# Patient Record
Sex: Male | Born: 1955 | Race: White | Hispanic: No | Marital: Single | Smoking: Former smoker
Health system: Southern US, Community
[De-identification: ages and names within clinical notes are randomized; demographics above are authoritative.]

## PROBLEM LIST (undated history)

## (undated) DIAGNOSIS — Z952 Presence of prosthetic heart valve: Secondary | ICD-10-CM

## (undated) DIAGNOSIS — I10 Essential (primary) hypertension: Secondary | ICD-10-CM

## (undated) DIAGNOSIS — R0789 Other chest pain: Secondary | ICD-10-CM

## (undated) DIAGNOSIS — Z7901 Long term (current) use of anticoagulants: Secondary | ICD-10-CM

## (undated) DIAGNOSIS — F419 Anxiety disorder, unspecified: Secondary | ICD-10-CM

## (undated) DIAGNOSIS — F32A Depression, unspecified: Secondary | ICD-10-CM

## (undated) DIAGNOSIS — E785 Hyperlipidemia, unspecified: Secondary | ICD-10-CM

## (undated) DIAGNOSIS — J45909 Unspecified asthma, uncomplicated: Secondary | ICD-10-CM

## (undated) DIAGNOSIS — F329 Major depressive disorder, single episode, unspecified: Secondary | ICD-10-CM

## (undated) DIAGNOSIS — I712 Thoracic aortic aneurysm, without rupture: Secondary | ICD-10-CM

## (undated) HISTORY — PX: CARDIAC CATHETERIZATION: SHX172

## (undated) HISTORY — PX: HAND SURGERY: SHX662

## (undated) HISTORY — DX: Hyperlipidemia, unspecified: E78.5

## (undated) HISTORY — DX: Presence of prosthetic heart valve: Z95.2

## (undated) HISTORY — DX: Essential (primary) hypertension: I10

## (undated) HISTORY — DX: Other chest pain: R07.89

## (undated) HISTORY — DX: Long term (current) use of anticoagulants: Z79.01

---

## 1986-04-25 HISTORY — PX: AORTIC VALVE REPLACEMENT: SHX41

## 1996-06-12 HISTORY — PX: DOPPLER ECHOCARDIOGRAPHY: SHX263

## 2003-10-18 ENCOUNTER — Ambulatory Visit (HOSPITAL_COMMUNITY): Admission: RE | Admit: 2003-10-18 | Discharge: 2003-10-18 | Payer: Self-pay | Admitting: Family Medicine

## 2006-05-30 ENCOUNTER — Emergency Department (HOSPITAL_COMMUNITY): Admission: EM | Admit: 2006-05-30 | Discharge: 2006-05-30 | Payer: Self-pay | Admitting: Emergency Medicine

## 2006-06-08 ENCOUNTER — Encounter: Admission: RE | Admit: 2006-06-08 | Discharge: 2006-06-08 | Payer: Self-pay | Admitting: Family Medicine

## 2009-12-25 ENCOUNTER — Ambulatory Visit: Payer: Self-pay | Admitting: Cardiology

## 2010-01-08 ENCOUNTER — Ambulatory Visit: Payer: Self-pay | Admitting: Cardiology

## 2010-02-05 ENCOUNTER — Ambulatory Visit: Payer: Self-pay | Admitting: Cardiology

## 2010-04-05 ENCOUNTER — Ambulatory Visit: Payer: Self-pay | Admitting: Cardiology

## 2010-05-05 ENCOUNTER — Ambulatory Visit: Payer: Self-pay | Admitting: Cardiology

## 2010-07-05 ENCOUNTER — Encounter (INDEPENDENT_AMBULATORY_CARE_PROVIDER_SITE_OTHER): Payer: Self-pay

## 2010-07-05 DIAGNOSIS — Z7901 Long term (current) use of anticoagulants: Secondary | ICD-10-CM

## 2010-07-05 DIAGNOSIS — Z954 Presence of other heart-valve replacement: Secondary | ICD-10-CM

## 2010-08-16 ENCOUNTER — Encounter: Payer: Self-pay | Admitting: Cardiology

## 2010-08-16 ENCOUNTER — Ambulatory Visit (INDEPENDENT_AMBULATORY_CARE_PROVIDER_SITE_OTHER): Payer: Self-pay | Admitting: *Deleted

## 2010-08-16 DIAGNOSIS — I359 Nonrheumatic aortic valve disorder, unspecified: Secondary | ICD-10-CM

## 2010-09-10 NOTE — H&P (Signed)
[name] [name], [name] [name]               ACCOUNT NO.:  [id number]   MEDICAL RECORD NO.:  [id number]          PATIENT TYPE:  EMS   LOCATION:  [facility]                         FACILITY:  [facility]   PHYSICIAN:  Andres Shad. Rudean Curt, MD     DATE OF BIRTH:  Aug 31, 1955   DATE OF ADMISSION:  05/30/2006  DATE OF DISCHARGE:                              HISTORY & PHYSICAL   PRIMARY CARE PHYSICIAN:  L. Lupe Carney, M.D.   CHIEF COMPLAINT:  Painful thumb.   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  He has had  superficial splinters before, and he usually puts some antibiotic cream  on his hands, as he does a lot of woodworking, and that usually solves  the problem.  This time, however, the pain persisted.  It became more  and more severe, and so the patient came into the emergency room after  three days after the distal aspect of his thumb became quite reddened  and painful.  Dr. Read Drivers ordered labs on the patient, gave him some  topical anesthetic as well as a tetanus vaccine, and performed an  incision of a deep splinter.  The patient was not febrile, nor was he  tachycardic, hypotensive, or did he have a white count with a shift.  However, Dr. Read Drivers was concerned that with the splinter being in for a  few days and a history of a prosthetic valve, that he may need  antibiotics long term.  He discussed the case with Dr. Amanda Pea of hand  surgery, and Dr. Amanda Pea confirmed that the patient would likely need IV  antibiotics.  The patient was given a dose of IV vancomycin in the  emergency room.  Currently, he is feeling okay.  He complains of some  mild soreness in his right thumb.  He denies any headaches or vision  changes, dysphagia, chest pain, palpitations, shortness of breath,  wheeze, cough, abdominal pain, hematuria, dysuria, constipation,  diarrhea, focal extremity numbness, weakness, or pain.   REVIEW OF SYSTEMS:  Otherwise negative.   PAST MEDICAL HISTORY:  1. Bacterial endocarditis.  2. 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  3. History of hypertension.  4. Hyperlipidemia.   MEDICATIONS:  1. Vytorin.  2. HCTZ.  3. Coumadin 5 mg p.o. at bedtime.   ALLERGIES:  He has no known drug allergies.   SOCIAL HISTORY:  No tobacco or drug use.  Occasional alcohol use, not  heavy.   FAMILY HISTORY:  Noncontributory.   PHYSICAL EXAMINATION:  VITAL SIGNS:  Temperature 97.9, heart rate 86,  blood pressure 124/79, respirations 18, O2 saturation 98% on room air.  GENERAL:  The patient is alert and oriented x3, no apparent distress.  HEENT:  Normocephalic, atraumatic.  His mucous membranes are moist.  NECK:  He has no  carotid bruits.  HEART:  Regular rate and rhythm, S1, S2.  He has a 3/6 systolic ejection  murmur.  LUNGS:  Clear to auscultation bilaterally.  ABDOMEN:  Soft, nontender, nondistended.  Positive bowel sounds.  EXTREMITIES:  Normal, except for his right thumb, which is status post  debridement and is now wrapped.   LABORATORY DATA:  White count 10.2, mild shift of 78%.  H&H 14.4 and 42,  MCV 83, platelet count 181.  Sodium 139, potassium 3.8, chloride 107,  bicarbonate 26, BUN 10, creatinine 0.9, glucose 110.  INR therapeutic at  2.4.   ASSESSMENT AND PLAN:  1. Cellulitis of the right thumb, status post splinter removal.  The      patient has already been given a dose of vancomycin.  Will continue      vancomycin as well as pain control.  Dr. Rudean Curt, who is the      hospitalist caring for this patient, is an infectious disease      specialist and can best determine length of antibiotic time if any      needed, oral versus IV.  The patient needs IV antibiotics.  Will       set up an IV and home health IV antibiotics.  If not, will      discharge him on oral antibiotics.  In the meantime, keep him in      for 24-hour observation.  2. Hyperlipidemia.  Continue medications.  3. Hypertension.  Continued HCTZ.  4. History of bacterial endocarditis.  The patient is therapeutic on      his Coumadin.      Hollice Espy, M.D.  Electronically Signed      Andres Shad. Rudean Curt, MD  Electronically Signed    SKK/MEDQ  D:  05/30/2006  T:  05/30/2006  Job:  [id number]   cc:   L. Lupe Carney, M.D.

## 2010-09-21 ENCOUNTER — Ambulatory Visit (INDEPENDENT_AMBULATORY_CARE_PROVIDER_SITE_OTHER): Payer: Self-pay | Admitting: *Deleted

## 2010-09-21 DIAGNOSIS — I359 Nonrheumatic aortic valve disorder, unspecified: Secondary | ICD-10-CM

## 2010-09-21 LAB — POCT INR: INR: 2.3

## 2010-10-18 ENCOUNTER — Other Ambulatory Visit (INDEPENDENT_AMBULATORY_CARE_PROVIDER_SITE_OTHER): Payer: Self-pay | Admitting: *Deleted

## 2010-10-18 ENCOUNTER — Ambulatory Visit (INDEPENDENT_AMBULATORY_CARE_PROVIDER_SITE_OTHER): Payer: Self-pay | Admitting: *Deleted

## 2010-10-18 ENCOUNTER — Telehealth: Payer: Self-pay | Admitting: Cardiology

## 2010-10-18 DIAGNOSIS — I359 Nonrheumatic aortic valve disorder, unspecified: Secondary | ICD-10-CM

## 2010-10-18 DIAGNOSIS — E785 Hyperlipidemia, unspecified: Secondary | ICD-10-CM

## 2010-10-18 LAB — LIPID PANEL
Cholesterol: 144 mg/dL (ref 0–200)
HDL: 49.1 mg/dL (ref >39.00)
LDL Cholesterol: 79 mg/dL (ref 0–99)
Total CHOL/HDL Ratio: 3
Triglycerides: 81 mg/dL (ref 0.0–149.0)
VLDL: 16.2 mg/dL (ref 0.0–40.0)

## 2010-10-18 LAB — HEPATIC FUNCTION PANEL
Albumin: 4.1 g/dL (ref 3.5–5.2)
Alkaline Phosphatase: 55 U/L (ref 39–117)
Bilirubin, Direct: 0.1 mg/dL (ref 0.0–0.3)
Total Bilirubin: 0.5 mg/dL (ref 0.3–1.2)
Total Protein: 6.4 g/dL (ref 6.0–8.3)

## 2010-10-18 LAB — BASIC METABOLIC PANEL
BUN: 30 mg/dL — ABNORMAL HIGH (ref 6–23)
Calcium: 8.9 mg/dL (ref 8.4–10.5)

## 2010-10-18 NOTE — Telephone Encounter (Signed)
Synetta Fail: While checking out Mr. Wessell wanted to come back for a coumadin check in four weeks instead of the recommended 2 weeks. Please call back. Yelena: He also had some questions about the ability to have a same day discount if he paid the same day like he used to. He says that he has called [place] several times and all they do is ask him a series of questions that lead nowhere. Please call back. I have pulled the chart.

## 2010-10-19 ENCOUNTER — Other Ambulatory Visit: Payer: Self-pay | Admitting: Cardiology

## 2010-10-19 NOTE — Telephone Encounter (Signed)
escribe medication per fax request  

## 2010-10-20 ENCOUNTER — Telehealth: Payer: Self-pay | Admitting: *Deleted

## 2010-10-20 NOTE — Telephone Encounter (Signed)
Lm w/ all of lab results.

## 2010-10-20 NOTE — Telephone Encounter (Signed)
Message copied by Lorayne Bender on Wed Oct 20, 2010 10:26 AM ------      Message from: [place], PETER M      Created: Tue Oct 19, 2010  9:16 PM       Chemistries and lipids look very good.

## 2010-11-15 ENCOUNTER — Encounter: Payer: Self-pay | Admitting: *Deleted

## 2010-11-18 ENCOUNTER — Ambulatory Visit (INDEPENDENT_AMBULATORY_CARE_PROVIDER_SITE_OTHER): Payer: Self-pay | Admitting: *Deleted

## 2010-11-18 DIAGNOSIS — I359 Nonrheumatic aortic valve disorder, unspecified: Secondary | ICD-10-CM

## 2010-11-18 LAB — POCT INR: INR: 2.1

## 2010-12-16 ENCOUNTER — Ambulatory Visit (INDEPENDENT_AMBULATORY_CARE_PROVIDER_SITE_OTHER): Payer: Self-pay | Admitting: *Deleted

## 2010-12-16 DIAGNOSIS — I359 Nonrheumatic aortic valve disorder, unspecified: Secondary | ICD-10-CM

## 2011-01-06 ENCOUNTER — Ambulatory Visit (INDEPENDENT_AMBULATORY_CARE_PROVIDER_SITE_OTHER): Payer: Self-pay | Admitting: *Deleted

## 2011-01-06 DIAGNOSIS — I359 Nonrheumatic aortic valve disorder, unspecified: Secondary | ICD-10-CM

## 2011-01-28 ENCOUNTER — Encounter: Payer: Self-pay | Admitting: *Deleted

## 2011-02-04 ENCOUNTER — Ambulatory Visit (INDEPENDENT_AMBULATORY_CARE_PROVIDER_SITE_OTHER): Payer: Self-pay | Admitting: *Deleted

## 2011-02-04 DIAGNOSIS — I359 Nonrheumatic aortic valve disorder, unspecified: Secondary | ICD-10-CM

## 2011-03-11 ENCOUNTER — Ambulatory Visit (INDEPENDENT_AMBULATORY_CARE_PROVIDER_SITE_OTHER): Payer: BC Managed Care – PPO | Admitting: *Deleted

## 2011-03-11 ENCOUNTER — Other Ambulatory Visit: Payer: Self-pay | Admitting: Cardiology

## 2011-03-11 DIAGNOSIS — I359 Nonrheumatic aortic valve disorder, unspecified: Secondary | ICD-10-CM

## 2011-03-11 MED ORDER — EZETIMIBE-SIMVASTATIN 10-20 MG PO TABS: 1.0000 | ORAL_TABLET | Freq: Every day | ORAL | Status: DC

## 2011-04-15 ENCOUNTER — Ambulatory Visit (INDEPENDENT_AMBULATORY_CARE_PROVIDER_SITE_OTHER): Payer: BC Managed Care – PPO | Admitting: *Deleted

## 2011-04-15 DIAGNOSIS — I359 Nonrheumatic aortic valve disorder, unspecified: Secondary | ICD-10-CM

## 2011-04-15 LAB — POCT INR: INR: 3

## 2011-05-05 ENCOUNTER — Encounter: Payer: Self-pay | Admitting: Cardiology

## 2011-05-13 ENCOUNTER — Ambulatory Visit: Payer: BC Managed Care – PPO | Admitting: Cardiology

## 2011-05-23 ENCOUNTER — Encounter: Payer: BC Managed Care – PPO | Admitting: *Deleted

## 2011-06-06 ENCOUNTER — Ambulatory Visit: Payer: BC Managed Care – PPO | Admitting: Cardiology

## 2011-06-13 ENCOUNTER — Ambulatory Visit (INDEPENDENT_AMBULATORY_CARE_PROVIDER_SITE_OTHER): Payer: BC Managed Care – PPO

## 2011-06-13 DIAGNOSIS — I359 Nonrheumatic aortic valve disorder, unspecified: Secondary | ICD-10-CM

## 2011-06-14 ENCOUNTER — Other Ambulatory Visit: Payer: Self-pay | Admitting: Cardiology

## 2011-06-20 ENCOUNTER — Other Ambulatory Visit: Payer: Self-pay | Admitting: *Deleted

## 2011-06-21 ENCOUNTER — Other Ambulatory Visit: Payer: Self-pay

## 2011-06-21 MED ORDER — HYDROCHLOROTHIAZIDE 25 MG PO TABS: ORAL_TABLET | ORAL | Status: DC

## 2011-07-25 ENCOUNTER — Ambulatory Visit (INDEPENDENT_AMBULATORY_CARE_PROVIDER_SITE_OTHER): Payer: BC Managed Care – PPO | Admitting: *Deleted

## 2011-07-25 DIAGNOSIS — I359 Nonrheumatic aortic valve disorder, unspecified: Secondary | ICD-10-CM

## 2011-08-15 ENCOUNTER — Ambulatory Visit (INDEPENDENT_AMBULATORY_CARE_PROVIDER_SITE_OTHER): Payer: BC Managed Care – PPO | Admitting: Cardiology

## 2011-08-15 ENCOUNTER — Encounter: Payer: Self-pay | Admitting: Cardiology

## 2011-08-15 VITALS — BP 138/84 | HR 62 | Ht 71.0 in | Wt 199.0 lb

## 2011-08-15 DIAGNOSIS — E785 Hyperlipidemia, unspecified: Secondary | ICD-10-CM

## 2011-08-15 DIAGNOSIS — Z952 Presence of prosthetic heart valve: Secondary | ICD-10-CM

## 2011-08-15 DIAGNOSIS — Z7901 Long term (current) use of anticoagulants: Secondary | ICD-10-CM

## 2011-08-15 DIAGNOSIS — I10 Essential (primary) hypertension: Secondary | ICD-10-CM

## 2011-08-15 DIAGNOSIS — I359 Nonrheumatic aortic valve disorder, unspecified: Secondary | ICD-10-CM

## 2011-08-15 NOTE — Progress Notes (Signed)
   [name] Date of Birth: 1955/10/20 Medical Record #[id number]  History of Present Illness: [name] [name] is seen for followup today. He is a 56 year old white male who is status post aortic valve replacement with a #23 mm St. Jude prosthesis in 1988. 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 He has been on chronic anticoagulation with Coumadin. He hasn't been seen in 3 years. He has kept his Coumadin followup. In addition to his valvular disease he also has a history of hyperlipidemia and hypertension. He reports that he is feeling well. He has difficulty losing weight. He is working now as a Chartered certified accountant. He denies any chest pain, shortness of breath, or palpitations. He's had no edema. He's had no bleeding problems on Coumadin.  Current Outpatient Prescriptions on File Prior to Visit  Medication Sig Dispense Refill  . ezetimibe-simvastatin (VYTORIN) 10-20 MG per tablet Take 1 tablet by mouth at bedtime.  64 tablet  0  . hydrochlorothiazide (HYDRODIURIL) 25 MG tablet Take 1/2 daily  45 tablet  3  . warfarin (COUMADIN) 5 MG tablet TAKE ONE TABLET BY MOUTH EVERY DAY FOR 4 DAYS  OF THE WEEK, THEN TAKE ONE-HALF TABLET ON MONDAY, WEDNESDAY, AND FRIDAY OR AS DIRECTED  35 tablet  3  . DISCONTD: hydrochlorothiazide (MICROZIDE) 12.5 MG capsule Take 12.5 mg by mouth daily.        No Known Allergies  Past Medical History  Diagnosis Date  . Chest pain, atypical   . Hypertension   . Hyperlipidemia   . Chronic anticoagulation   . S/P AVR     for endocarditis    Past Surgical History  Procedure Date  . Aortic valve replacement 1988    23mm St. Jude valve  . Doppler echocardiography 06/12/1996    EF 55-60%    History  Smoking status  . Former Smoker  . Quit date: 05/04/1986  Smokeless tobacco  . Not on file    History  Alcohol Use No    History reviewed. No pertinent family history.  Review of Systems: The review of systems is positive for  occasional fatigue. He has some cough. All other systems were reviewed and are negative.  Physical Exam: BP 138/84  Pulse 62  Ht 5\' 11"  (1.803 m)  Wt 199 lb (90.266 kg)  BMI 27.75 kg/m2 He is a pleasant white male in no acute distress. HEENT exam is unremarkable. He is normocephalic, atraumatic. Pupils are equal round and reactive. Sclera are clear. Oropharynx is clear. Neck is supple without JVD, adenopathy, thyromegaly, or bruits. Carotid upstrokes are normal. Lungs are clear. Cardiac exam reveals a regular rate and rhythm with a good mechanical aortic valve click. There are no murmurs or rubs. Abdomen is soft and nontender without masses or bruits. Extremities are without edema. Equal pulses are 2+ and symmetric. Skin is warm and dry. She is alert and oriented x3. Cranial nerves II through XII are intact. LABORATORY DATA: His last chemistry panel and lipid panel were reviewed from June of 2012. They were acceptable. ECG today demonstrates normal sinus rhythm with poor R wave progression in leads V1 through V4. It is otherwise normal.  Assessment / Plan:

## 2011-08-15 NOTE — Assessment & Plan Note (Signed)
Last blood work in June of 2012 was acceptable. He has deferred further blood work at this time.

## 2011-08-15 NOTE — Assessment & Plan Note (Signed)
He is asymptomatic. His bowel sounds are normal. We will continue with anticoagulation with Coumadin. Keep followup appointments in the Coumadin clinic. He needs  SBE prophylaxis. I'll followup again in one year.

## 2011-08-15 NOTE — Assessment & Plan Note (Addendum)
Blood pressure control is acceptable on HCTZ.

## 2011-08-15 NOTE — Patient Instructions (Signed)
Continue your current therapy  I will see you again in one year.   

## 2011-10-31 ENCOUNTER — Ambulatory Visit (INDEPENDENT_AMBULATORY_CARE_PROVIDER_SITE_OTHER): Payer: BC Managed Care – PPO | Admitting: *Deleted

## 2011-10-31 DIAGNOSIS — I359 Nonrheumatic aortic valve disorder, unspecified: Secondary | ICD-10-CM

## 2011-10-31 LAB — PROTIME-INR: INR: 5.2 ratio — ABNORMAL HIGH (ref 0.8–1.0)

## 2011-11-14 ENCOUNTER — Ambulatory Visit (INDEPENDENT_AMBULATORY_CARE_PROVIDER_SITE_OTHER): Payer: BC Managed Care – PPO | Admitting: *Deleted

## 2011-11-14 DIAGNOSIS — I359 Nonrheumatic aortic valve disorder, unspecified: Secondary | ICD-10-CM

## 2011-11-14 LAB — POCT INR: INR: 2.8

## 2011-11-16 ENCOUNTER — Other Ambulatory Visit: Payer: Self-pay | Admitting: Cardiology

## 2011-12-06 ENCOUNTER — Ambulatory Visit (INDEPENDENT_AMBULATORY_CARE_PROVIDER_SITE_OTHER): Payer: BC Managed Care – PPO | Admitting: *Deleted

## 2011-12-06 DIAGNOSIS — I359 Nonrheumatic aortic valve disorder, unspecified: Secondary | ICD-10-CM

## 2011-12-06 LAB — POCT INR: INR: 3.3

## 2012-01-09 ENCOUNTER — Ambulatory Visit (INDEPENDENT_AMBULATORY_CARE_PROVIDER_SITE_OTHER): Payer: BC Managed Care – PPO | Admitting: Pharmacist

## 2012-01-09 DIAGNOSIS — I359 Nonrheumatic aortic valve disorder, unspecified: Secondary | ICD-10-CM

## 2012-01-09 LAB — POCT INR: INR: 3.5

## 2012-01-19 ENCOUNTER — Other Ambulatory Visit: Payer: Self-pay | Admitting: Cardiology

## 2012-02-22 ENCOUNTER — Ambulatory Visit (INDEPENDENT_AMBULATORY_CARE_PROVIDER_SITE_OTHER): Payer: BC Managed Care – PPO | Admitting: *Deleted

## 2012-02-22 DIAGNOSIS — I359 Nonrheumatic aortic valve disorder, unspecified: Secondary | ICD-10-CM

## 2012-05-10 ENCOUNTER — Telehealth: Payer: Self-pay | Admitting: Cardiology

## 2012-05-10 MED ORDER — WARFARIN SODIUM 5 MG PO TABS: 5.0000 mg | ORAL_TABLET | ORAL | Status: DC

## 2012-05-10 NOTE — Telephone Encounter (Signed)
New Problem:    Patient called in needing a refill of his warfarin (COUMADIN) 5 MG tablet.  Patient is down to his last pill.

## 2012-05-21 ENCOUNTER — Ambulatory Visit (INDEPENDENT_AMBULATORY_CARE_PROVIDER_SITE_OTHER): Payer: 59 | Admitting: *Deleted

## 2012-05-21 ENCOUNTER — Encounter: Payer: Self-pay | Admitting: *Deleted

## 2012-05-21 DIAGNOSIS — I359 Nonrheumatic aortic valve disorder, unspecified: Secondary | ICD-10-CM

## 2012-05-21 LAB — POCT INR: INR: 3.2

## 2012-06-07 ENCOUNTER — Other Ambulatory Visit: Payer: Self-pay | Admitting: Cardiology

## 2012-08-30 ENCOUNTER — Ambulatory Visit (INDEPENDENT_AMBULATORY_CARE_PROVIDER_SITE_OTHER): Payer: Self-pay | Admitting: *Deleted

## 2012-08-30 DIAGNOSIS — I359 Nonrheumatic aortic valve disorder, unspecified: Secondary | ICD-10-CM

## 2012-10-29 ENCOUNTER — Other Ambulatory Visit: Payer: Self-pay | Admitting: *Deleted

## 2012-10-29 MED ORDER — HYDROCHLOROTHIAZIDE 25 MG PO TABS: ORAL_TABLET | ORAL | Status: DC

## 2012-10-29 NOTE — Telephone Encounter (Signed)
Pt is aware of appointment Fax Received. Refill Completed. [name] [name] (R.M.A)

## 2012-11-07 ENCOUNTER — Telehealth: Payer: Self-pay | Admitting: *Deleted

## 2012-11-07 NOTE — Telephone Encounter (Signed)
Left message for pt to call clinic as he missed appt June 19th . Has not been seen since May  8th. Instructed to call and let us know if his coumadin is being checked by someone else and if not needs to make appt for his INR to be checked

## 2012-11-08 ENCOUNTER — Telehealth: Payer: Self-pay

## 2012-11-08 NOTE — Telephone Encounter (Signed)
Pt called with only two days left of coumadin. He has an appt with [name] 11/16/12. Pt would like refills sent to [facility] on [address] [phone]) Pt would like a call back at the following number [phone].

## 2012-11-09 ENCOUNTER — Telehealth: Payer: Self-pay | Admitting: *Deleted

## 2012-11-09 MED ORDER — WARFARIN SODIUM 5 MG PO TABS: ORAL_TABLET | ORAL | Status: DC

## 2012-11-09 NOTE — Telephone Encounter (Signed)
Phone number at present is [phone] not [phone]

## 2012-11-09 NOTE — Telephone Encounter (Signed)
Called pt again today and he said only had 2 more coumadin tablets so ordered him 10 tablets and made him an appt to be seen on Friday  July 25 when he sees Thomasenia Bottoms  And instructed would refill rest of coumadin when he sees [place] on Friday. Pt has not been seen in coumadin clinic since May missed appt in June. Pt states he is staying with girl friend and this is his phone number at present  [phone]

## 2012-11-16 ENCOUNTER — Ambulatory Visit (INDEPENDENT_AMBULATORY_CARE_PROVIDER_SITE_OTHER): Payer: 59 | Admitting: Physician Assistant

## 2012-11-16 ENCOUNTER — Ambulatory Visit (INDEPENDENT_AMBULATORY_CARE_PROVIDER_SITE_OTHER): Payer: 59 | Admitting: *Deleted

## 2012-11-16 ENCOUNTER — Encounter: Payer: Self-pay | Admitting: Physician Assistant

## 2012-11-16 VITALS — BP 120/82 | HR 82 | Ht 70.0 in | Wt 206.0 lb

## 2012-11-16 DIAGNOSIS — I359 Nonrheumatic aortic valve disorder, unspecified: Secondary | ICD-10-CM

## 2012-11-16 DIAGNOSIS — E785 Hyperlipidemia, unspecified: Secondary | ICD-10-CM

## 2012-11-16 DIAGNOSIS — I10 Essential (primary) hypertension: Secondary | ICD-10-CM

## 2012-11-16 LAB — BASIC METABOLIC PANEL
Calcium: 10.1 mg/dL (ref 8.4–10.5)
GFR: 82 mL/min (ref >60.00)
Glucose, Bld: 114 mg/dL — ABNORMAL HIGH (ref 70–99)
Potassium: 3.7 mEq/L (ref 3.5–5.1)
Sodium: 135 mEq/L (ref 135–145)

## 2012-11-16 LAB — LDL CHOLESTEROL, DIRECT: Direct LDL: 181.4 mg/dL

## 2012-11-16 LAB — HEPATIC FUNCTION PANEL
ALT: 22 U/L (ref 0–53)
AST: 17 U/L (ref 0–37)
Albumin: 4.5 g/dL (ref 3.5–5.2)
Total Bilirubin: 0.7 mg/dL (ref 0.3–1.2)

## 2012-11-16 LAB — LIPID PANEL: VLDL: 41.6 mg/dL — ABNORMAL HIGH (ref 0.0–40.0)

## 2012-11-16 MED ORDER — WARFARIN SODIUM 5 MG PO TABS: ORAL_TABLET | ORAL | Status: DC

## 2012-11-16 NOTE — Progress Notes (Signed)
  [address] [place], [place]  [address] Phone: [phone] Fax:  [phone]  Date:  11/16/2012   ID:  [name] [name], [name] [name] 1955/10/15, MRN [id number]  PCP:  Benita Stabile, MD  Cardiologist:  Dr. Peter [place]     History of Present Illness: [name] [name] is a 57 y.o. male who returns for f/u.  He has a hx of St. 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  Last seen by Dr. Peter [place] 07/2011.  The patient denies chest pain, shortness of breath, syncope, orthopnea, PND or significant pedal edema.   Labs (6/12):  K 3.9, Cr 1.1, ALT 13, LDL 79  Wt Readings from Last 3 Encounters:  11/16/12 206 lb (93.441 kg)  08/15/11 199 lb (90.266 kg)     Past Medical History  Diagnosis Date  . Chest pain, atypical   . Hypertension   . Hyperlipidemia   . Chronic anticoagulation   . S/P AVR     for endocarditis    Current Outpatient Prescriptions  Medication Sig Dispense Refill  . ezetimibe-simvastatin (VYTORIN) 10-20 MG per tablet Take 1 tablet by mouth at bedtime. Take 1/2 tab daily      . hydrochlorothiazide (HYDRODIURIL) 25 MG tablet Take 1/2 daily  30 tablet  0  . warfarin (COUMADIN) 5 MG tablet Take as directed by coumadin clinic  10 tablet  0   No current facility-administered medications for this visit.    Allergies:   No Known Allergies  Social History:  The patient  reports that he quit smoking about 26 years ago. He does not have any smokeless tobacco history on file. He reports that he does not drink alcohol or use illicit drugs.   ROS:  Please see the history of present illness.   No bleeding problems.   All other systems reviewed and negative.   PHYSICAL EXAM: VS:  BP 120/82  Pulse 82  Ht 5\' 10"  (1.778 m)  Wt 206 lb (93.441 kg)  BMI 29.56 kg/m2 Well nourished, well developed, in no acute distress HEENT: normal Neck: no JVD Cardiac:  normal S1, mechanical S2; RRR; no murmur Lungs:  clear to auscultation bilaterally,  no wheezing, rhonchi or rales Abd: soft, nontender, no hepatomegaly Ext: no edema Skin: warm and dry Neuro:  CNs 2-12 intact, no focal abnormalities noted  EKG:  NSR, HR 82, PAC, NSSTTW changes     ASSESSMENT AND PLAN:  1. Aortic Insufficiency in the setting of Endocarditis, s/p St. Jude AVR:  Stable.  He follows with our coumadin clinic.  Continue SBE prophylaxis.  Arrange f/u echo. 2. Hypertension:  Controlled.  Continue current therapy.  Check BMET. 3. Hyperlipidemia:  Check Lipids and LFTs.  Continue Vytorin. 4. Disposition:  F/u with Dr. Peter [place] in 1 year.   Signed, [name], PA-C  11/16/2012 2:13 PM

## 2012-11-16 NOTE — Patient Instructions (Addendum)
LABS TODAY; BMET, FLP, LFT  PLEASE SCHEDULE TO HAVE AN ECHO DONE DX 424.1  PLEASE FOLLOW UP WITH DR. [place] IN 1 YEAR

## 2012-11-19 ENCOUNTER — Telehealth: Payer: Self-pay | Admitting: *Deleted

## 2012-11-19 NOTE — Telephone Encounter (Signed)
lmptcb to go over lab results 

## 2012-11-19 NOTE — Telephone Encounter (Signed)
Message copied by Tarri Fuller on Mon Nov 19, 2012  9:19 AM ------      Message from: [place], [place] T      Created: Fri Nov 16, 2012  4:58 PM       K+ and creatinine ok      Cholesterol too high      Is he taking Vytorin?      May need to consider changing to Lipitor (which is generic).  But, would like to know what he is doing with his Vytorin first.      [name], PA-C        11/16/2012 4:58 PM ------

## 2012-11-21 ENCOUNTER — Other Ambulatory Visit: Payer: Self-pay

## 2012-11-22 MED ORDER — WARFARIN SODIUM 5 MG PO TABS: ORAL_TABLET | ORAL | Status: DC

## 2012-11-22 NOTE — Telephone Encounter (Signed)
We have lmptcb several times. I lmom on brother's # to see if there is an alternate # to reach pt

## 2012-11-23 ENCOUNTER — Encounter: Payer: Self-pay | Admitting: *Deleted

## 2012-11-23 NOTE — Telephone Encounter (Signed)
lmptcb x 3, will mail results letter today to pt.

## 2012-11-28 ENCOUNTER — Telehealth: Payer: Self-pay | Admitting: *Deleted

## 2012-11-28 NOTE — Telephone Encounter (Signed)
11/28/12 I have left this patient several messages to call me and schedule his appointment on  7/28,7/31 and 11/28/12. We will now wait for the patient  to reply.

## 2012-12-28 ENCOUNTER — Ambulatory Visit (INDEPENDENT_AMBULATORY_CARE_PROVIDER_SITE_OTHER): Payer: 59

## 2012-12-28 DIAGNOSIS — I359 Nonrheumatic aortic valve disorder, unspecified: Secondary | ICD-10-CM

## 2012-12-28 LAB — POCT INR: INR: 3.1

## 2013-01-24 ENCOUNTER — Other Ambulatory Visit: Payer: Self-pay | Admitting: *Deleted

## 2013-01-24 MED ORDER — HYDROCHLOROTHIAZIDE 25 MG PO TABS: ORAL_TABLET | ORAL | Status: DC

## 2013-02-08 ENCOUNTER — Encounter (INDEPENDENT_AMBULATORY_CARE_PROVIDER_SITE_OTHER): Payer: Self-pay

## 2013-02-08 ENCOUNTER — Encounter (INDEPENDENT_AMBULATORY_CARE_PROVIDER_SITE_OTHER): Payer: 59

## 2013-02-08 DIAGNOSIS — I359 Nonrheumatic aortic valve disorder, unspecified: Secondary | ICD-10-CM

## 2013-03-08 ENCOUNTER — Ambulatory Visit (INDEPENDENT_AMBULATORY_CARE_PROVIDER_SITE_OTHER): Payer: 59 | Admitting: Pharmacist

## 2013-03-08 DIAGNOSIS — I359 Nonrheumatic aortic valve disorder, unspecified: Secondary | ICD-10-CM

## 2013-03-08 LAB — POCT INR: INR: 2.1

## 2013-03-26 ENCOUNTER — Other Ambulatory Visit: Payer: Self-pay | Admitting: Cardiology

## 2013-04-01 ENCOUNTER — Telehealth: Payer: Self-pay | Admitting: Cardiology

## 2013-04-01 DIAGNOSIS — E785 Hyperlipidemia, unspecified: Secondary | ICD-10-CM

## 2013-04-01 DIAGNOSIS — I10 Essential (primary) hypertension: Secondary | ICD-10-CM

## 2013-04-01 NOTE — Telephone Encounter (Signed)
New Problem:  Pt states he is calling to find out if he can get his cholesterol checked when he comes in on the 12th. There are no orders in Epic. Pt wants to know how long he needs to fast for. Pt states he works 2nd shift and gets home after midnight. Pt states he usually has a meal after work around 1:00 am and then goes to bed later that morning. At what time would he need to start fasting. Please advise

## 2013-04-02 NOTE — Telephone Encounter (Signed)
Returned call to patient he stated he would like to have fasting lab on same day he has his INR 04/05/13.Advised ok to have fasting labs done on same day.

## 2013-04-05 ENCOUNTER — Other Ambulatory Visit (INDEPENDENT_AMBULATORY_CARE_PROVIDER_SITE_OTHER): Payer: BC Managed Care – PPO

## 2013-04-05 ENCOUNTER — Ambulatory Visit (INDEPENDENT_AMBULATORY_CARE_PROVIDER_SITE_OTHER): Payer: BC Managed Care – PPO | Admitting: *Deleted

## 2013-04-05 ENCOUNTER — Telehealth: Payer: Self-pay

## 2013-04-05 DIAGNOSIS — E785 Hyperlipidemia, unspecified: Secondary | ICD-10-CM

## 2013-04-05 DIAGNOSIS — I10 Essential (primary) hypertension: Secondary | ICD-10-CM

## 2013-04-05 DIAGNOSIS — I359 Nonrheumatic aortic valve disorder, unspecified: Secondary | ICD-10-CM

## 2013-04-05 LAB — LIPID PANEL
HDL: 50.9 mg/dL (ref >39.00)
LDL Cholesterol: 63 mg/dL (ref 0–99)
Total CHOL/HDL Ratio: 2
Triglycerides: 64 mg/dL (ref 0.0–149.0)
VLDL: 12.8 mg/dL (ref 0.0–40.0)

## 2013-04-05 LAB — BASIC METABOLIC PANEL
BUN: 16 mg/dL (ref 6–23)
CO2: 31 mEq/L (ref 19–32)
Calcium: 9.7 mg/dL (ref 8.4–10.5)
Chloride: 103 mEq/L (ref 96–112)
Glucose, Bld: 101 mg/dL — ABNORMAL HIGH (ref 70–99)
Sodium: 138 mEq/L (ref 135–145)

## 2013-04-05 LAB — HEPATIC FUNCTION PANEL
AST: 21 U/L (ref 0–37)
Albumin: 4.5 g/dL (ref 3.5–5.2)
Bilirubin, Direct: 0.1 mg/dL (ref 0.0–0.3)
Total Bilirubin: 0.7 mg/dL (ref 0.3–1.2)

## 2013-04-05 NOTE — Telephone Encounter (Signed)
Vytorin 10/20 mg samples given to patient .

## 2013-04-12 ENCOUNTER — Telehealth: Payer: Self-pay

## 2013-04-12 NOTE — Telephone Encounter (Signed)
Patient came to office wanted to report to Dr.Jordan he now weighs 170 lbs.Stated his former weight 210 lbs.Dr.Jordan was made aware of his success.

## 2013-04-14 DIAGNOSIS — Z22322 Carrier or suspected carrier of Methicillin resistant Staphylococcus aureus: Secondary | ICD-10-CM | POA: Insufficient documentation

## 2013-04-14 DIAGNOSIS — L0201 Cutaneous abscess of face: Secondary | ICD-10-CM | POA: Insufficient documentation

## 2013-04-14 DIAGNOSIS — Z7901 Long term (current) use of anticoagulants: Secondary | ICD-10-CM | POA: Insufficient documentation

## 2013-04-14 DIAGNOSIS — L02512 Cutaneous abscess of left hand: Secondary | ICD-10-CM | POA: Insufficient documentation

## 2013-05-08 ENCOUNTER — Other Ambulatory Visit: Payer: Self-pay | Admitting: Cardiology

## 2013-05-15 ENCOUNTER — Other Ambulatory Visit: Payer: Self-pay | Admitting: *Deleted

## 2013-05-15 MED ORDER — HYDROCHLOROTHIAZIDE 25 MG PO TABS: ORAL_TABLET | ORAL | Status: DC

## 2013-05-17 ENCOUNTER — Ambulatory Visit (INDEPENDENT_AMBULATORY_CARE_PROVIDER_SITE_OTHER): Payer: BC Managed Care – PPO

## 2013-05-17 DIAGNOSIS — I359 Nonrheumatic aortic valve disorder, unspecified: Secondary | ICD-10-CM

## 2013-05-17 LAB — POCT INR: INR: 2.8

## 2013-06-28 ENCOUNTER — Ambulatory Visit (INDEPENDENT_AMBULATORY_CARE_PROVIDER_SITE_OTHER): Payer: BC Managed Care – PPO

## 2013-06-28 DIAGNOSIS — I359 Nonrheumatic aortic valve disorder, unspecified: Secondary | ICD-10-CM

## 2013-06-28 LAB — POCT INR: INR: 1.7

## 2013-07-19 ENCOUNTER — Ambulatory Visit (INDEPENDENT_AMBULATORY_CARE_PROVIDER_SITE_OTHER): Payer: BC Managed Care – PPO | Admitting: Pharmacist

## 2013-07-19 DIAGNOSIS — I359 Nonrheumatic aortic valve disorder, unspecified: Secondary | ICD-10-CM

## 2013-07-19 LAB — POCT INR: INR: 3.6

## 2013-08-12 ENCOUNTER — Telehealth: Payer: Self-pay

## 2013-08-12 NOTE — Telephone Encounter (Signed)
Spoke to patient Dr.Jordan received records, when looking in chart noticed no return appointment.Appointment scheduled with Dr.Jordan 10/11/13 at 2:30 pm.

## 2013-08-30 ENCOUNTER — Other Ambulatory Visit: Payer: Self-pay | Admitting: Family Medicine

## 2013-08-30 ENCOUNTER — Ambulatory Visit
Admission: RE | Admit: 2013-08-30 | Discharge: 2013-08-30 | Disposition: A | Discharge status: Home / Self Care | Payer: BC Managed Care – PPO | Source: Ambulatory Visit | Attending: Family Medicine | Admitting: Family Medicine

## 2013-08-30 DIAGNOSIS — M25519 Pain in unspecified shoulder: Secondary | ICD-10-CM

## 2013-09-06 ENCOUNTER — Ambulatory Visit (INDEPENDENT_AMBULATORY_CARE_PROVIDER_SITE_OTHER): Payer: BC Managed Care – PPO

## 2013-09-06 DIAGNOSIS — I359 Nonrheumatic aortic valve disorder, unspecified: Secondary | ICD-10-CM

## 2013-09-06 LAB — POCT INR: INR: 2.8

## 2013-09-24 ENCOUNTER — Encounter: Payer: Self-pay | Admitting: Cardiology

## 2013-10-08 ENCOUNTER — Other Ambulatory Visit: Payer: Self-pay | Admitting: Cardiology

## 2013-10-11 ENCOUNTER — Ambulatory Visit (INDEPENDENT_AMBULATORY_CARE_PROVIDER_SITE_OTHER): Payer: BC Managed Care – PPO | Admitting: *Deleted

## 2013-10-11 ENCOUNTER — Ambulatory Visit: Payer: BC Managed Care – PPO | Admitting: Cardiology

## 2013-10-11 DIAGNOSIS — I359 Nonrheumatic aortic valve disorder, unspecified: Secondary | ICD-10-CM

## 2013-10-11 LAB — POCT INR: INR: 2.9

## 2013-10-17 ENCOUNTER — Telehealth: Payer: Self-pay

## 2013-10-17 NOTE — Telephone Encounter (Signed)
Received message patient needs wants appointment with Dr.Jordan in the next couple of weeks.Patient called no answer.LMTC.

## 2013-11-05 ENCOUNTER — Telehealth: Payer: Self-pay

## 2013-11-05 NOTE — Telephone Encounter (Signed)
Patient called no answer.Left message on personal voice mail received message needs appointment with Dr.Jordan.Advised to call me back for appointment.

## 2013-11-22 ENCOUNTER — Encounter: Payer: Self-pay | Admitting: Cardiology

## 2013-11-22 ENCOUNTER — Ambulatory Visit (INDEPENDENT_AMBULATORY_CARE_PROVIDER_SITE_OTHER): Payer: BC Managed Care – PPO | Admitting: Cardiology

## 2013-11-22 ENCOUNTER — Ambulatory Visit (INDEPENDENT_AMBULATORY_CARE_PROVIDER_SITE_OTHER): Payer: BC Managed Care – PPO | Admitting: Pharmacist Clinician (PhC)/ Clinical Pharmacy Specialist

## 2013-11-22 VITALS — BP 128/84 | HR 72 | Ht 70.0 in | Wt 191.0 lb

## 2013-11-22 DIAGNOSIS — I359 Nonrheumatic aortic valve disorder, unspecified: Secondary | ICD-10-CM

## 2013-11-22 DIAGNOSIS — Z952 Presence of prosthetic heart valve: Secondary | ICD-10-CM

## 2013-11-22 DIAGNOSIS — Z954 Presence of other heart-valve replacement: Secondary | ICD-10-CM

## 2013-11-22 DIAGNOSIS — I10 Essential (primary) hypertension: Secondary | ICD-10-CM

## 2013-11-22 DIAGNOSIS — Z7901 Long term (current) use of anticoagulants: Secondary | ICD-10-CM

## 2013-11-22 DIAGNOSIS — E785 Hyperlipidemia, unspecified: Secondary | ICD-10-CM

## 2013-11-22 LAB — POCT INR: INR: 4.6

## 2013-11-22 NOTE — Patient Instructions (Addendum)
We will schedule you for an Echocardiogram  Continue your current therapy  I will see you in one year with fasting lab work

## 2013-11-22 NOTE — Addendum Note (Signed)
Addended by: Meda KlinefelterPUGH, Elaysha Bevard JOHNSON D on: 11/22/2013 09:27 AM   Modules accepted: Orders

## 2013-11-22 NOTE — Progress Notes (Signed)
[name] [name] Date of Birth: 04/06/1956 Medical Record #[id number]  History of Present Illness: [name] [name] is seen for followup today. He is a 58 year old white male who is status post aortic valve replacement with a #23 mm St. Jude prosthesis in 1988. 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 He has been on chronic anticoagulation with Coumadin.  In addition to his valvular disease he also has a history of hyperlipidemia and hypertension. He reports that he is feeling well.  He is working now as a Chartered certified accountant. He denies any chest pain, shortness of breath, or palpitations. He's had no edema. He's had no bleeding problems on Coumadin. He has resumed smoking cannabis for the past year. He was admitted to [facility] in December with an abcess of his face. This responded to IV antibiotics and drainage. Blood cultures were negative.  Current Outpatient Prescriptions on File Prior to Visit  Medication Sig Dispense Refill  . ezetimibe-simvastatin (VYTORIN) 10-20 MG per tablet Take 1 tablet by mouth at bedtime. Take 1/2 tab daily      . hydrochlorothiazide (HYDRODIURIL) 25 MG tablet Take 1/2 daily  45 tablet  1  . warfarin (COUMADIN) 5 MG tablet TAKE AS DIRECTED  35 tablet  3   No current facility-administered medications on file prior to visit.    No Known Allergies  Past Medical History  Diagnosis Date  . Chest pain, atypical   . Hypertension   . Hyperlipidemia   . Chronic anticoagulation   . S/P AVR     for endocarditis    Past Surgical History  Procedure Laterality Date  . Aortic valve replacement  1988    23mm St. Jude valve  . Doppler echocardiography  06/12/1996    EF 55-60%    History  Smoking status  . Former Smoker  . Quit date: 05/04/1986  Smokeless tobacco  . Not on file    History  Alcohol Use No    History reviewed. No pertinent family history.  Review of Systems: The review of systems is positive for occasional fatigue.  He has some cough. All other systems were reviewed and are negative.  Physical Exam: BP 128/84  Pulse 72  Ht 5\' 10"  (1.778 m)  Wt 191 lb (86.637 kg)  BMI 27.41 kg/m2 He is a pleasant white male in no acute distress. HEENT exam is unremarkable. He is normocephalic, atraumatic. Pupils are equal round and reactive. Sclera are clear. Oropharynx is clear. Neck is supple without JVD, adenopathy, thyromegaly, or bruits. Carotid upstrokes are normal. Lungs are clear. Cardiac exam reveals a regular rate and rhythm with a good mechanical aortic valve click. There are no murmurs or rubs. Abdomen is soft and nontender without masses or bruits. Extremities are without edema. Equal pulses are 2+ and symmetric. Skin is warm and dry. She is alert and oriented x3. Cranial nerves II through XII are intact.  LABORATORY DATA:  ECG today demonstrates normal sinus rhythm with normal Ecg.  Lab Results  Component Value Date   GLUCOSE 101* 04/05/2013   CHOL 127 04/05/2013   TRIG 64.0 04/05/2013   HDL 50.90 04/05/2013   LDLDIRECT 181.4 11/16/2012   LDLCALC 63 04/05/2013   ALT 21 04/05/2013   AST 21 04/05/2013   NA 138 04/05/2013   K 3.9 04/05/2013   CL 103 04/05/2013   CREATININE 1.0 04/05/2013   BUN 16 04/05/2013   CO2 31 04/05/2013   INR 4.6 11/22/2013     Assessment /  Plan: 1. S/p mechanical AVR. INR high today. Will adjust. Keep follow up in Coumadin clinic.  2. HTN- controlled.  3. Hyperlipidemia controlled on Vytorin.  Plan follow up in one year. We will update an Echocardiogram.

## 2013-12-13 ENCOUNTER — Ambulatory Visit (HOSPITAL_COMMUNITY)
Admission: RE | Admit: 2013-12-13 | Discharge: 2013-12-13 | Disposition: A | Discharge status: Home / Self Care | Payer: BC Managed Care – PPO | Source: Ambulatory Visit | Attending: Cardiovascular Disease | Admitting: Cardiovascular Disease

## 2013-12-13 ENCOUNTER — Ambulatory Visit (INDEPENDENT_AMBULATORY_CARE_PROVIDER_SITE_OTHER): Payer: BC Managed Care – PPO | Admitting: Pharmacist Clinician (PhC)/ Clinical Pharmacy Specialist

## 2013-12-13 DIAGNOSIS — Z954 Presence of other heart-valve replacement: Secondary | ICD-10-CM | POA: Insufficient documentation

## 2013-12-13 DIAGNOSIS — I77810 Thoracic aortic ectasia: Secondary | ICD-10-CM | POA: Insufficient documentation

## 2013-12-13 DIAGNOSIS — Z952 Presence of prosthetic heart valve: Secondary | ICD-10-CM

## 2013-12-13 DIAGNOSIS — I359 Nonrheumatic aortic valve disorder, unspecified: Secondary | ICD-10-CM

## 2013-12-13 DIAGNOSIS — I10 Essential (primary) hypertension: Secondary | ICD-10-CM

## 2013-12-13 DIAGNOSIS — I517 Cardiomegaly: Secondary | ICD-10-CM

## 2013-12-13 DIAGNOSIS — E785 Hyperlipidemia, unspecified: Secondary | ICD-10-CM

## 2013-12-13 DIAGNOSIS — Z7901 Long term (current) use of anticoagulants: Secondary | ICD-10-CM

## 2013-12-13 LAB — POCT INR: INR: 3.4

## 2013-12-13 NOTE — Progress Notes (Signed)
2D Echocardiogram Complete.  12/13/2013   Desmond Szabo, RDCS  

## 2013-12-16 ENCOUNTER — Telehealth: Payer: Self-pay | Admitting: Cardiology

## 2013-12-16 NOTE — Telephone Encounter (Signed)
Returned call to patient no answer.LMTC. 

## 2013-12-16 NOTE — Telephone Encounter (Signed)
Returning your call. °

## 2013-12-17 ENCOUNTER — Other Ambulatory Visit: Payer: Self-pay

## 2013-12-17 DIAGNOSIS — I7789 Other specified disorders of arteries and arterioles: Secondary | ICD-10-CM

## 2013-12-17 NOTE — Telephone Encounter (Signed)
Returned call to patient echo results given.Schedulers will call back to schedule chest ct with contrast.

## 2013-12-24 ENCOUNTER — Telehealth: Payer: Self-pay | Admitting: Cardiology

## 2013-12-24 ENCOUNTER — Ambulatory Visit (INDEPENDENT_AMBULATORY_CARE_PROVIDER_SITE_OTHER)
Admission: RE | Admit: 2013-12-24 | Discharge: 2013-12-24 | Disposition: A | Discharge status: Home / Self Care | Payer: BC Managed Care – PPO | Source: Ambulatory Visit | Attending: Cardiology | Admitting: Cardiology

## 2013-12-24 DIAGNOSIS — I7789 Other specified disorders of arteries and arterioles: Secondary | ICD-10-CM

## 2013-12-24 MED ORDER — IOHEXOL 350 MG/ML SOLN
100.0000 mL | Freq: Once | INTRAVENOUS | Status: AC | PRN
  Administered 2013-12-24: 100 mL via INTRAVENOUS

## 2013-12-24 NOTE — Telephone Encounter (Signed)
Enlargement of the aortic root measuring at least 5.2 cm. The  patient is post aortic valve replacement.   Suspicious 1 cm nodule in the lingula. A primary lung neoplasm  cannot be excluded and recommend further characterization with a  PET-CT.  Report in EPIC.   Message forwarded to Dr. Jewel Baize

## 2013-12-24 NOTE — Telephone Encounter (Signed)
Patient called no answer.LMTC. 

## 2013-12-25 ENCOUNTER — Other Ambulatory Visit: Payer: Self-pay

## 2013-12-25 DIAGNOSIS — I712 Thoracic aortic aneurysm, without rupture, unspecified: Secondary | ICD-10-CM

## 2013-12-25 DIAGNOSIS — R911 Solitary pulmonary nodule: Secondary | ICD-10-CM

## 2013-12-25 NOTE — Telephone Encounter (Signed)
Received call from patient this morning.Chest Ct results given.Schedulers will be calling back with appointment with [facility].

## 2013-12-26 ENCOUNTER — Telehealth: Payer: Self-pay | Admitting: Cardiology

## 2013-12-26 NOTE — Telephone Encounter (Signed)
LEFT MESSAGE TO TAKE  IT EASY UNTIL YOUR APPOINTMENT UNTIL WITH THE SURGEONS

## 2013-12-26 NOTE — Telephone Encounter (Signed)
Pt has appt with Dr. Morton Peters on September 9 for 5 cm aneurysm and lung nodule.  Elnita Maxwell spoke to patient yesterday but he has some additional questions.Marland KitchenMarland Kitchen

## 2013-12-26 NOTE — Telephone Encounter (Signed)
Left message to call back  

## 2013-12-26 NOTE — Telephone Encounter (Signed)
CALLED  PHONE BUSY  WILL ATTEMPT LATER

## 2013-12-26 NOTE — Telephone Encounter (Signed)
Returning your call,please leave a message if he is not there.He is leaving at 2:00.

## 2013-12-31 NOTE — Telephone Encounter (Signed)
Returned call to patient no answer.LMTC.Advised to keep appointment with Dr.Van Trigt tomorrow 01/01/14 at 1:30 pm.

## 2014-01-01 ENCOUNTER — Institutional Professional Consult (permissible substitution) (INDEPENDENT_AMBULATORY_CARE_PROVIDER_SITE_OTHER): Payer: BC Managed Care – PPO | Admitting: Cardiothoracic Surgery

## 2014-01-01 ENCOUNTER — Telehealth: Payer: Self-pay | Admitting: Cardiology

## 2014-01-01 ENCOUNTER — Encounter: Payer: Self-pay | Admitting: Cardiothoracic Surgery

## 2014-01-01 VITALS — BP 141/95 | HR 84 | Ht 70.0 in | Wt 191.0 lb

## 2014-01-01 DIAGNOSIS — I712 Thoracic aortic aneurysm, without rupture, unspecified: Secondary | ICD-10-CM

## 2014-01-01 DIAGNOSIS — Z7901 Long term (current) use of anticoagulants: Secondary | ICD-10-CM

## 2014-01-01 DIAGNOSIS — I7121 Aneurysm of the ascending aorta, without rupture: Secondary | ICD-10-CM

## 2014-01-01 DIAGNOSIS — Z952 Presence of prosthetic heart valve: Secondary | ICD-10-CM

## 2014-01-01 DIAGNOSIS — I359 Nonrheumatic aortic valve disorder, unspecified: Secondary | ICD-10-CM

## 2014-01-01 NOTE — Telephone Encounter (Signed)
New message      Need Dr [place] to set up a heart cath for pt for next week.

## 2014-01-01 NOTE — Progress Notes (Signed)
PCP is Lupe Carney, MD Referring Provider is [place], Annaleigha Woo M, MD  Chief Complaint  Patient presents with  . 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 The patient is a 58 year old Caucasian male status post mechanical aVR 23 mm St. Jude valve 1988 by Dr. Micah Noel for endocarditis and probable bicuspid aortic valve. Bacteremia probably related to dental disease. He is been followed by Dr. [place]iland over the years. His most recent echocardiogram showed normal functioning of the mechanical valve with good LV function over the valve disease. His aortic root appear to be dilated. He underwent a CTA which demonstrates a fusiform ascending aneurysm with a maximum diameter of 5.2. The distal ascending and arch are of normal diameter. No significant pulmonary lesions-there is a 9 mm nodule in the lingula which appears low risk.  Patient has hypertension and hyperlipidemia. He's not had a coronary angiogram.   Past Medical History  Diagnosis Date  . Chest pain, atypical   . Hypertension   . Hyperlipidemia   . Chronic anticoagulation   . S/P AVR     for endocarditis    Past Surgical History  Procedure Laterality Date  . Aortic valve replacement  1988    23mm St. Jude valve  . Doppler echocardiography  06/12/1996    EF 55-60%    No family history on file.  Social History History  Substance Use Topics  . Smoking status: Former Smoker    Quit date: 05/04/1986  . Smokeless tobacco: Not on file  . Alcohol Use: No    Current Outpatient Prescriptions  Medication Sig Dispense Refill  . ALPRAZolam (XANAX) 0.5 MG tablet Take 0.25 mg by mouth.      . ezetimibe-simvastatin (VYTORIN) 10-20 MG per tablet Take 1 tablet by mouth at bedtime. Take 1/2 tab daily      . hydrochlorothiazide (HYDRODIURIL) 25 MG tablet Take 1/2 daily  45 tablet  1  . warfarin (COUMADIN) 5 MG tablet TAKE AS DIRECTED  35 tablet  3   No current  facility-administered medications for this visit.    No Known Allergies  Review of Systems Patient works full time as a Chartered certified accountant Patient lives alone Patient is right-hand dominant The patient sees a Education officer, community for dental cleaning every 6 months The patient has no active dental complaints The patient developed a right facial abscess requiring I&D in hospitalization earlier this year. He is a nondiabetic Patient states he recovered well from his previous aVR.  BP 141/95  Pulse 84  Ht  (1.778 m)  Wt 191 lb (86.637 kg)  BMI 27.41 kg/m2  SpO2 96% Physical Exam Alert and comfortable well-developed middle-aged male HEENT normocephalic pupils equal dentition good Neck without JVD mass or bruit, no palpable adenopathy Thorax-well-healed sternal incision, breath sounds clear Cardiac-normal rhythm with sharp valve closure signs of mechanical aVR, no AI murmur Abdomen-soft nontender Extremities no edema cyanosis clubbing Vascular-mild varicosities of the right lower leg, palpable pulses Neurologic-no focal motor deficit, cranial nerves grossly intact  Diagnostic Tests: Echocardiogram, CTA personally reviewed  Impression: 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 At age 46 he would request another St. Jude valve if root replacement is needed.  Plan: Patient with a left heart cath by Dr. [place] then return to discuss timing of surgery. We'll stop his Coumadin probably 3  days prior to surgery

## 2014-01-02 NOTE — Telephone Encounter (Signed)
Returned call to patient no answer.LMTC. 

## 2014-01-03 ENCOUNTER — Other Ambulatory Visit: Payer: Self-pay

## 2014-01-03 ENCOUNTER — Other Ambulatory Visit: Payer: Self-pay | Admitting: Cardiology

## 2014-01-03 DIAGNOSIS — I712 Thoracic aortic aneurysm, without rupture, unspecified: Secondary | ICD-10-CM

## 2014-01-03 DIAGNOSIS — I359 Nonrheumatic aortic valve disorder, unspecified: Secondary | ICD-10-CM

## 2014-01-03 DIAGNOSIS — Z952 Presence of prosthetic heart valve: Secondary | ICD-10-CM

## 2014-01-03 DIAGNOSIS — Z7901 Long term (current) use of anticoagulants: Secondary | ICD-10-CM

## 2014-01-03 NOTE — Telephone Encounter (Signed)
Please call.

## 2014-01-03 NOTE — Telephone Encounter (Signed)
Received call from patient he stated he wants to have cath next week.Left cardiac cath scheduled with [facility] 01/07/14.Dr.Jordan advised will need to hold coumadin 5 days prior to cath.Patient understands to start holding coumadin today 01/03/14.Patient will come to office Monday 01/06/14 to pick up cath instructions and have pre cath lab work.Cindy with Dr.VanTrigt's office called and message left on personal voice mail cath scheduled 01/07/14.

## 2014-01-06 ENCOUNTER — Encounter (HOSPITAL_COMMUNITY): Payer: Self-pay | Admitting: Pharmacy Technician

## 2014-01-06 LAB — CBC WITH DIFFERENTIAL/PLATELET
Basophils Absolute: 0.1 10*3/uL (ref 0.0–0.1)
Basophils Relative: 1 % (ref 0–1)
EOS ABS: 0.2 10*3/uL (ref 0.0–0.7)
Eosinophils Relative: 3 % (ref 0–5)
HEMATOCRIT: 41 % (ref 39.0–52.0)
HEMOGLOBIN: 14.1 g/dL (ref 13.0–17.0)
LYMPHS ABS: 1.5 10*3/uL (ref 0.7–4.0)
Lymphocytes Relative: 19 % (ref 12–46)
MCH: 27.4 pg (ref 26.0–34.0)
MCHC: 34.4 g/dL (ref 30.0–36.0)
MCV: 79.8 fL (ref 78.0–100.0)
Monocytes Absolute: 0.5 10*3/uL (ref 0.1–1.0)
Monocytes Relative: 7 % (ref 3–12)
NEUTROS PCT: 70 % (ref 43–77)
Neutro Abs: 5.4 10*3/uL (ref 1.7–7.7)
Platelets: 189 10*3/uL (ref 150–400)
RBC: 5.14 MIL/uL (ref 4.22–5.81)
RDW: 15.7 % — ABNORMAL HIGH (ref 11.5–15.5)
WBC: 7.7 10*3/uL (ref 4.0–10.5)

## 2014-01-06 LAB — BASIC METABOLIC PANEL
BUN: 16 mg/dL (ref 6–23)
CALCIUM: 9.3 mg/dL (ref 8.4–10.5)
CO2: 25 meq/L (ref 19–32)
Chloride: 103 mEq/L (ref 96–112)
Creat: 0.89 mg/dL (ref 0.50–1.35)
GLUCOSE: 98 mg/dL (ref 70–99)
Potassium: 4.1 mEq/L (ref 3.5–5.3)
SODIUM: 139 meq/L (ref 135–145)

## 2014-01-06 LAB — PROTIME-INR
INR: 1.55 — ABNORMAL HIGH (ref <1.50)
Prothrombin Time: 18.6 seconds — ABNORMAL HIGH (ref 11.6–15.2)

## 2014-01-07 ENCOUNTER — Encounter (HOSPITAL_COMMUNITY): Payer: Self-pay | Admitting: Cardiology

## 2014-01-07 ENCOUNTER — Encounter (HOSPITAL_COMMUNITY)
Admission: RE | Disposition: A | Discharge status: Home / Self Care | Payer: Self-pay | Source: Ambulatory Visit | Attending: Cardiovascular Disease

## 2014-01-07 ENCOUNTER — Ambulatory Visit (HOSPITAL_COMMUNITY)
Admission: RE | Admit: 2014-01-07 | Discharge: 2014-01-07 | Disposition: A | Discharge status: Home / Self Care | Payer: BC Managed Care – PPO | Source: Ambulatory Visit | Attending: Cardiovascular Disease | Admitting: Cardiovascular Disease

## 2014-01-07 DIAGNOSIS — I712 Thoracic aortic aneurysm, without rupture, unspecified: Secondary | ICD-10-CM

## 2014-01-07 DIAGNOSIS — I714 Abdominal aortic aneurysm, without rupture, unspecified: Secondary | ICD-10-CM | POA: Insufficient documentation

## 2014-01-07 DIAGNOSIS — Z954 Presence of other heart-valve replacement: Secondary | ICD-10-CM | POA: Diagnosis not present

## 2014-01-07 DIAGNOSIS — I359 Nonrheumatic aortic valve disorder, unspecified: Secondary | ICD-10-CM

## 2014-01-07 DIAGNOSIS — E785 Hyperlipidemia, unspecified: Secondary | ICD-10-CM | POA: Diagnosis not present

## 2014-01-07 DIAGNOSIS — Z952 Presence of prosthetic heart valve: Secondary | ICD-10-CM

## 2014-01-07 DIAGNOSIS — I10 Essential (primary) hypertension: Secondary | ICD-10-CM | POA: Insufficient documentation

## 2014-01-07 DIAGNOSIS — Z7901 Long term (current) use of anticoagulants: Secondary | ICD-10-CM | POA: Diagnosis not present

## 2014-01-07 DIAGNOSIS — I7121 Aneurysm of the ascending aorta, without rupture: Secondary | ICD-10-CM

## 2014-01-07 DIAGNOSIS — Z87891 Personal history of nicotine dependence: Secondary | ICD-10-CM | POA: Insufficient documentation

## 2014-01-07 HISTORY — DX: Thoracic aortic aneurysm, without rupture: I71.2

## 2014-01-07 HISTORY — DX: Aneurysm of the ascending aorta, without rupture: I71.21

## 2014-01-07 HISTORY — PX: LEFT HEART CATHETERIZATION WITH CORONARY ANGIOGRAM: SHX5451

## 2014-01-07 LAB — POCT I-STAT 3, VENOUS BLOOD GAS (G3P V)
Acid-Base Excess: 1 mmol/L (ref 0.0–2.0)
BICARBONATE: 26.4 meq/L — AB (ref 20.0–24.0)
O2 SAT: 70 %
PH VEN: 7.368 — AB (ref 7.250–7.300)
TCO2: 28 mmol/L (ref 0–100)
pCO2, Ven: 45.8 mmHg (ref 45.0–50.0)
pO2, Ven: 38 mmHg (ref 30.0–45.0)

## 2014-01-07 LAB — POCT I-STAT 3, ART BLOOD GAS (G3+)
Bicarbonate: 25.1 mEq/L — ABNORMAL HIGH (ref 20.0–24.0)
O2 SAT: 92 %
PO2 ART: 65 mmHg — AB (ref 80.0–100.0)
TCO2: 26 mmol/L (ref 0–100)
pCO2 arterial: 42.3 mmHg (ref 35.0–45.0)
pH, Arterial: 7.382 (ref 7.350–7.450)

## 2014-01-07 SURGERY — LEFT HEART CATHETERIZATION WITH CORONARY ANGIOGRAM
Anesthesia: LOCAL

## 2014-01-07 MED ORDER — HEPARIN (PORCINE) IN NACL 2-0.9 UNIT/ML-% IJ SOLN
INTRAMUSCULAR | Status: AC
  Filled 2014-01-07: qty 1000

## 2014-01-07 MED ORDER — ASPIRIN 81 MG PO CHEW
CHEWABLE_TABLET | ORAL | Status: AC
  Filled 2014-01-07: qty 1

## 2014-01-07 MED ORDER — SODIUM CHLORIDE 0.9 % IV SOLN
INTRAVENOUS | Status: DC
  Administered 2014-01-07: 09:00:00 via INTRAVENOUS

## 2014-01-07 MED ORDER — ASPIRIN EC 81 MG PO TBEC: 81.0000 mg | DELAYED_RELEASE_TABLET | Freq: Every day | ORAL | Status: DC

## 2014-01-07 MED ORDER — SODIUM CHLORIDE 0.9 % IV SOLN: INTRAVENOUS | Status: DC

## 2014-01-07 MED ORDER — SODIUM CHLORIDE 0.9 % IJ SOLN: 3.0000 mL | INTRAMUSCULAR | Status: DC | PRN

## 2014-01-07 MED ORDER — FENTANYL CITRATE 0.05 MG/ML IJ SOLN
INTRAMUSCULAR | Status: AC
  Filled 2014-01-07: qty 2

## 2014-01-07 MED ORDER — ASPIRIN 81 MG PO CHEW
81.0000 mg | CHEWABLE_TABLET | ORAL | Status: AC
  Administered 2014-01-07: 81 mg via ORAL

## 2014-01-07 MED ORDER — MIDAZOLAM HCL 2 MG/2ML IJ SOLN
INTRAMUSCULAR | Status: AC
  Filled 2014-01-07: qty 2

## 2014-01-07 MED ORDER — SODIUM CHLORIDE 0.9 % IV SOLN: 250.0000 mL | INTRAVENOUS | Status: DC | PRN

## 2014-01-07 MED ORDER — SODIUM CHLORIDE 0.9 % IJ SOLN: 3.0000 mL | Freq: Two times a day (BID) | INTRAMUSCULAR | Status: DC

## 2014-01-07 MED ORDER — ONDANSETRON HCL 4 MG/2ML IJ SOLN: 4.0000 mg | Freq: Four times a day (QID) | INTRAMUSCULAR | Status: DC | PRN

## 2014-01-07 MED ORDER — LIDOCAINE HCL (PF) 1 % IJ SOLN
INTRAMUSCULAR | Status: AC
  Filled 2014-01-07: qty 30

## 2014-01-07 MED ORDER — ACETAMINOPHEN 325 MG PO TABS: 650.0000 mg | ORAL_TABLET | ORAL | Status: DC | PRN

## 2014-01-07 NOTE — CV Procedure (Signed)
[name] [name] is a 58 y.o. male   [id number]  [id number] LOCATION:  FACILITY: [facility]  PHYSICIAN: Lennette Bihari, MD, [facility] 08/23/1955   DATE OF PROCEDURE:  01/07/2014      RIGHT AND LEFT HEART CARDIAC CATHETERIZATION   HISTORY:  KEBRON PULSE is a 58 y.o. male who is a patient of Drs. Theron Arista [place] and Kathlee Nations Tright. The patient underwent aortic valve replacement with a #23 St. Jude mechanical prosthesis in 1988, by Dr. Micah Noel.  He has a history of hypertension, as well as hyperlipidemia.  He was recently found to have an ascending aortic aneurysm , measuring at least 5.2 cm.  The patient had seen Dr. Morton Peters for surgical evaluation.  He presents now for definitive right and left heart cardiac catheterization prior to planned surgery.   PROCEDURE:  Right and left heart catheterization: Swan-Ganz catheterization, cardiac output determination by the thermodilution and assumed Fick method, coronary angiography, supravalvular aortography.  The patient was brought to the second floor [facility] cath lab in the postabsorptive state. Versed 2 mg and fentanyl 50 mcg were administered for conscious sedation. The right groin was prepped and draped in sterile fashion and a 5 [place] arterial sheath and 7 French venous sheath were inserted without difficulty. A Swan-Ganz catheter was advanced into the venous sheath and pressures were obtained in the right atrium, right ventricle, pulmonary artery, and pulmonary capillary wedge position. Cardiac outputs were obtained by the thermodilution and assumed Fick methods. Oxygen saturation was obtained in the pulmonary artery and aorta. A pigtail catheter was inserted and simultaneous AO/PA pressures were recorded. The pigtail catheter was advanced into the left ventricle and simultaneous left ventricular and PCW pressures were recorded. Left ventriculography was performed in the RAO projection.  A left ventricle to aorta pullback was performed.  The pigtail catheter was then removed and diagnostic catheterization to delineate the coronary anatomy was performed utilizing 5 French Judkins 4 left and right diagnostic catheters. All catheters were removed and the patient. Hemostasis was obtained by direct manual pressure. The patient tolerated the procedure well and returned to his room in satisfactory condition.   HEMODYNAMICS:   RA: 7 RV: 21/3/7 PA: 21/8 mean 14 PC: a 10 v 9 mean 7  AO: 129/79 Oxygen saturation in the aorta 92% and the pulmonary artery 70%  Cardiac output: 5.9 l/min (Thermo); 6.3 (Fick)  Cardiac index: 3.0 l/m/m2                3.2  ANGIOGRAPHY:   Left main:  Short angiographically normal vessel which trifurcated into the LAD, the ramus intermediate vessel ending dominant left circumflex coronary.  LAD: Large caliber vessel, which extended to and wrapped around the LV apex.  The vessel gave rise to one major proximal diagonal vessel and several septal perforator arteries.  There was a very small area in the mid LAD that had a very mild component of systolic bridging of approximately 20%.   Ramus intermediate: Normal vessel  Left circumflex: Large, dominant vessel, which gave rise to 2 small first and second marginal branches.  The distal vessel and then a PDA and PLA vessel.  There was minimal 20% ostial narrowing of a small first marginal branch.   Right coronary artery: Small caliber, nondominant vessel  Left ventriculography was not performed due to to the St. Jude mechanical aortic valve.  Supravalvular aortography reveals a dilated aortic root, which was almost horizontal approaching the aortic valve.  There was  no aortic insufficiency.  The aortic valve had normal mobility and was competent.   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  Normal right heart  pressures.  Dilated ascending aortic aneurysm.  St. Jude mechanical aortic valve with normal mobility and no evidence for aortic insufficiency.   RECOMMENDATION:  The patient will followup with Dr. Morton Peters for his planned ascending aortic aneurysm surgery.   Lennette Bihari, MD, [facility] 01/07/2014 12:36 PM

## 2014-01-07 NOTE — Discharge Instructions (Signed)
Return To Work [name] [name] was treated at our facility.  RETURN TO WORK  Employee may return to work on: ____________________  Human resources officer may return to modified work on: ____________________ WORK ACTIVITY RESTRICTIONS Work activities not tolerated include: _____ Bending _____ Prolonged sitting __X__ Lifting _____ Squatting _____ Prolonged standing _____ Climbing _____ Reaching _____ Pushing and pulling _____ Walking __X__ Other __no lifting over 10 pounds for 2 days_ Show this Return to Work statement to Proofreader at work as soon as possible. Your employer should be aware of your condition and can help with the necessary work activity restrictions. If you wish to return to work sooner than the date above, or if you have further problems which make it difficult for you to return at that time, please call us or your caregiver.  Physician Name (Printed)  Nicki Guadalajara January 07, 2014  Document Released: 04/11/2005 Document Revised: 07/04/2011 Document Reviewed: 09/26/2006 [facility] Patient Information 2015 [place], [place]. This information is not intended to replace advice given to you by your health care provider. Make sure you discuss any questions you have with your health care provider.

## 2014-01-07 NOTE — Progress Notes (Signed)
Site area: right  Groin 74fraterial and 7 fr venous sheath removed  Site Prior to Removal:  Level 0  Pressure Applied For 20 MINUTES    Minutes Beginning at 1225p  Manual:   Yes.    Patient Status During Pull:  stable  Post Pull Groin Site:  Level 0  Post Pull Instructions Given:  Yes.    Post Pull Pulses Present:  Yes.    Dressing Applied:  Yes.    Comments:  VS remain stable.  Pt denies any discomfort at this time

## 2014-01-07 NOTE — H&P (Signed)
[name] [name] is an 58 y.o. male.    Primary Cardiologist:Dr. P. 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 Jude prosthesis in 1988. 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 He has been on chronic anticoagulation with Coumadin. In addition to his valvular disease he also has a history of hyperlipidemia and hypertension. He reports that he is feeling well. He is working now as a Furniture conservator/restorer. He denies any chest pain, shortness of breath, or palpitations. He's had no edema. He's had no bleeding problems on Coumadin. He has resumed smoking cannabis for the past year. He was admitted to [facility] in December with an abcess of his face. This responded to IV antibiotics and drainage. Blood cultures were negative. He had been doing well and saw Dr. Martinique for routine follow up.  Echo was ordered to access the valve and he was found to have Normal LV function; mechanical AVR with normal mean gradient of 10 mmHg; severely dilated ascending aorta (50 mm); suggest CTA to further assess.  CTA revealed: Enlargement of the aortic root measuring at least 5.2 cm. The patient is post aortic valve replacement.  Suspicious 1 cm nodule in the lingula. A primary lung neoplasm  cannot be excluded and recommend further characterization with a  PET-CT.  Pt then sent to Dr. Prescott Gum for consult. He felt the 9 mm nodule was low risk.  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 At age 38 he would request another St. Jude valve if root replacement is needed.  He is here today for elective cardiac cath. He has never had a cardiac cath.  He will follow up with Dr. Prescott Gum  on Friday.   Past Medical History  Diagnosis Date  . Chest pain, atypical   . Hypertension   . Hyperlipidemia   . Chronic anticoagulation   . S/P AVR     for endocarditis  . Ascending aortic aneurysm 01/07/2014    Past Surgical History  Procedure Laterality Date  . Aortic valve replacement  1988    66m St. Jude valve  . Doppler echocardiography  06/12/1996    EF 55-60%    Family History  Problem Relation Age of Onset  . Cancer Mother   . Healthy Brother   . Healthy Brother    Social History:  reports that he quit smoking about 27 years ago. He does not have any smokeless tobacco history on file. He reports that he drinks about 1.8 ounces of alcohol per week. He reports that he does not use illicit drugs.  Allergies: No Known Allergies  Medications Prior to Admission  Medication Sig Dispense Refill  . albuterol (PROVENTIL HFA;VENTOLIN HFA) 108 (90 BASE) MCG/ACT inhaler Inhale 2 puffs into the lungs every 6 (six) hours as needed for wheezing or shortness of breath.      . ALPRAZolam (XANAX) 0.5 MG tablet Take 0.25 mg by mouth daily as needed for anxiety.       . diphenhydramine-acetaminophen (TYLENOL PM) 25-500 MG TABS Take 1 tablet by mouth at bedtime as needed (sleep).      . ezetimibe-simvastatin (  VYTORIN) 10-20 MG per tablet Take 1 tablet by mouth at bedtime.       . hydrochlorothiazide (HYDRODIURIL) 25 MG tablet Take 12.5 mg by mouth daily.      Marland Kitchen ibuprofen (ADVIL,MOTRIN) 200 MG tablet Take 200 mg by mouth daily as needed for headache.      Marland Kitchen OVER THE COUNTER MEDICATION Take 3 tablets by mouth daily as needed (stomach issues). Medication from [facility] with Lactace enzymes      . warfarin (COUMADIN) 5 MG tablet Take 2.5-5 mg by mouth daily. 2.28m on Friday and 527mall other days        Results for orders placed in visit on 01/03/14 (from the past 48 hour(s))  PROTIME-INR     Status: Abnormal   Collection Time    01/06/14  9:47 AM      Result Value Ref Range   Prothrombin  Time 18.6 (*) 11.6 - 15.2 seconds   INR 1.55 (*) <1.50   Comment: The INR is of principal utility in following patients on stable doses     of oral anticoagulants.  The therapeutic range is generally 2.0 to     3.0, but may be 3.0 to 4.0 in patients with mechanical cardiac valves,     recurrent embolisms and antiphospholipid antibodies (including lupus     inhibitors).  BASIC METABOLIC PANEL     Status: None   Collection Time    01/06/14  9:57 AM      Result Value Ref Range   Sodium 139  135 - 145 mEq/L   Potassium 4.1  3.5 - 5.3 mEq/L   Chloride 103  96 - 112 mEq/L   CO2 25  19 - 32 mEq/L   Glucose, Bld 98  70 - 99 mg/dL   BUN 16  6 - 23 mg/dL   Creat 0.89  0.50 - 1.35 mg/dL   Calcium 9.3  8.4 - 10.5 mg/dL  CBC WITH DIFFERENTIAL     Status: Abnormal   Collection Time    01/06/14  9:59 AM      Result Value Ref Range   WBC 7.7  4.0 - 10.5 K/uL   RBC 5.14  4.22 - 5.81 MIL/uL   Hemoglobin 14.1  13.0 - 17.0 g/dL   HCT 41.0  39.0 - 52.0 %   MCV 79.8  78.0 - 100.0 fL   MCH 27.4  26.0 - 34.0 pg   MCHC 34.4  30.0 - 36.0 g/dL   RDW 15.7 (*) 11.5 - 15.5 %   Platelets 189  150 - 400 K/uL   Neutrophils Relative % 70  43 - 77 %   Neutro Abs 5.4  1.7 - 7.7 K/uL   Lymphocytes Relative 19  12 - 46 %   Lymphs Abs 1.5  0.7 - 4.0 K/uL   Monocytes Relative 7  3 - 12 %   Monocytes Absolute 0.5  0.1 - 1.0 K/uL   Eosinophils Relative 3  0 - 5 %   Eosinophils Absolute 0.2  0.0 - 0.7 K/uL   Basophils Relative 1  0 - 1 %   Basophils Absolute 0.1  0.0 - 0.1 K/uL   Smear Review Criteria for review not met     No results found.  ROS: General:no colds or fevers, no weight changes Skin:no rashes or ulcers HEENT:no blurred vision, no congestion, dental cleaning every 6 months CV:see HPI PUL:see HPI GI:no diarrhea constipation or melena, no indigestion GU:no hematuria, no dysuria MS:no  joint pain, no claudication Neuro:no syncope, no lightheadedness Endo:no diabetes, no thyroid disease     Blood pressure 149/87, pulse 81, temperature 97.8 F (36.6 C), temperature source Oral, resp. rate 18, height _0  (1.778 m), weight 180 lb (81.647 kg), SpO2 99.00%. PE: General:Pleasant affect, NAD Skin:Warm and dry, brisk capillary refill HEENT:normocephalic, sclera clear, mucus membranes moist Neck:supple, no JVD, no bruits, no adenopathy, no thryomegaly  Heart:S1S2 RRR with crisp closure of valve without murmur, gallup, rub or click Lungs:clear without rales, rhonchi, or wheezes HEK:BTCY, non tender, + BS, do not palpate liver spleen or masses Ext:no lower ext edema, 2+ pedal pulses, 2+ radial pulses Neuro:alert and oriented X 3, MAE, follows commands, + facial symmetry    Assessment/Plan Principal Problem:   Ascending aortic aneurysm- plan per pt and Dr. Prescott Gum on Friday after cath. Active Problems:   S/P AVR, #23 mm St Jude prosthesis in 1988 for bacterial endocarditis   Chronic anticoagulation with coumadin Held for 5 days, INR today 1.55   HTN (hypertension)- mildly elevated today   Hyperlipidemia-on statin  For elective cardiac cath today The patient understands that risks included but are not limited to stroke (1 in 1000), death (1 in 1000), kidney failure [usually temporary] (1 in 500), bleeding (1 in 200), allergic reaction [possibly serious] (1 in 200).   Pt agrees to cardiac cath.    [place] Practitioner Certified [place] Pager [phone] or after 5pm or weekends call [phone] 01/07/2014, 10:59 AM   Patient seen and examined. Agree with assessment and plan. Discussed cath with patient. Will plan for R and Left to assess PCWP as an estimate of LVEDP and cardiac output determination since will not cross mechanical AV.    Troy Sine, MD, [facility] 01/07/2014 11:28 AM

## 2014-01-10 ENCOUNTER — Encounter: Payer: Self-pay | Admitting: Cardiothoracic Surgery

## 2014-01-10 ENCOUNTER — Ambulatory Visit: Payer: BC Managed Care – PPO | Admitting: Pharmacist Clinician (PhC)/ Clinical Pharmacy Specialist

## 2014-01-10 ENCOUNTER — Ambulatory Visit (INDEPENDENT_AMBULATORY_CARE_PROVIDER_SITE_OTHER): Payer: BC Managed Care – PPO | Admitting: Cardiothoracic Surgery

## 2014-01-10 VITALS — BP 124/84 | HR 89 | Ht 70.0 in | Wt 180.0 lb

## 2014-01-10 DIAGNOSIS — I7121 Aneurysm of the ascending aorta, without rupture: Secondary | ICD-10-CM

## 2014-01-10 DIAGNOSIS — Z954 Presence of other heart-valve replacement: Secondary | ICD-10-CM

## 2014-01-10 DIAGNOSIS — I712 Thoracic aortic aneurysm, without rupture, unspecified: Secondary | ICD-10-CM

## 2014-01-10 DIAGNOSIS — Z952 Presence of prosthetic heart valve: Secondary | ICD-10-CM

## 2014-01-10 NOTE — Progress Notes (Signed)
PCP is Lupe Carney, MD Referring Provider is [place], Peter M, MD  Chief Complaint  Patient presents with  . Follow-up    F/U AFTER HEART CATH    HPI: The patient returns for discussion of aortic root replacement for a 5.2 cm ascending aneurysm at the sino-tubular junction . The patient has history of bicuspid aortic valve and is status post aortic valve replacement in 1989 for endocarditis with a 23 mm St. Jude valve. The patient recently had a CT scan of the chest demonstrating the ascending fusiform aneurysm following a 2-D echocardiogram of the valve which showed normal valve function but a dilated root. The patient is asymptomatic. Preparation for possible aortic replacement he underwent left heart cath by Dr. Nicholaus Bloom which demonstrated normal coronaries and normal right heart pressures and normal cardiac output. The patient takes Coumadin daily. He did not have any bleeding from his groin puncture. The patient has normal LV function by echocardiogram.   Past Medical History  Diagnosis Date  . Chest pain, atypical   . Hypertension   . Hyperlipidemia   . Chronic anticoagulation   . S/P AVR     for endocarditis  . Ascending aortic aneurysm 01/07/2014    Past Surgical History  Procedure Laterality Date  . Aortic valve replacement  1988    23mm St. Jude valve  . Doppler echocardiography  06/12/1996    EF 55-60%    Family History  Problem Relation Age of Onset  . Cancer Mother   . Healthy Brother   . Healthy Brother     Social History History  Substance Use Topics  . Smoking status: Former Smoker    Quit date: 05/04/1986  . Smokeless tobacco: Not on file  . Alcohol Use: 1.8 oz/week    3 Shots of liquor per week    Current Outpatient Prescriptions  Medication Sig Dispense Refill  . albuterol (PROVENTIL HFA;VENTOLIN HFA) 108 (90 BASE) MCG/ACT inhaler Inhale 2 puffs into the lungs every 6 (six) hours as needed for wheezing or shortness of breath.      .  diphenhydramine-acetaminophen (TYLENOL PM) 25-500 MG TABS Take 1 tablet by mouth at bedtime as needed (sleep).      . ezetimibe-simvastatin (VYTORIN) 10-20 MG per tablet Take 1 tablet by mouth at bedtime.       . hydrochlorothiazide (HYDRODIURIL) 25 MG tablet Take 12.5 mg by mouth daily.      Marland Kitchen OVER THE COUNTER MEDICATION Take 3 tablets by mouth daily as needed (stomach issues). Medication from [facility] with Lactace enzymes      . warfarin (COUMADIN) 5 MG tablet Take 2.5-5 mg by mouth daily. 2.5mg  on Friday and  all other days      . ALPRAZolam (XANAX) 0.5 MG tablet Take 0.25 mg by mouth daily as needed for anxiety.       Marland Kitchen ibuprofen (ADVIL,MOTRIN) 200 MG tablet Take 200 mg by mouth daily as needed for headache.       No current facility-administered medications for this visit.    No Known Allergies  Review of Systems no change from previous exam  BP 124/84  Pulse 89  Ht  (1.778 m)  Wt 180 lb (81.647 kg)  BMI 25.83 kg/m2  SpO2 98% Physical Exam Alert and comfortable accompanied by his warfarin HEENT normocephalic pupils equal Neck without JVD or bruit Lungs clear, well-healed sternal scar Cardiac rhythm regular with a loud aVR closure click, no AI murmur Abdomen soft nontender Extremities without edema warm pulses,  superficial varicosities left lower leg Neuro intact  Diagnostic Tests: Results of coronary angiogram in right heart cath data discussed with patient.  Impression: 5.2 cm aortic root aneurysm and the patient with prior aVR for bicuspid aortic valve endocarditis. This is asymptomatic. The patient is at risk for aortic tear or dissection and will be prepared for redo sternotomy with redo aVR and root replacement with right axillary cannulation on October 1.  I discussed the indications for procedure, the details surgery, and alternatives of surgery, and the risks involved including risks of bleeding stroke MI pleural effusion, and death.  Plan: Redo sternotomy,  aortic root replacement October 1st at [facility] hospital

## 2014-01-13 ENCOUNTER — Other Ambulatory Visit: Payer: Self-pay

## 2014-01-13 ENCOUNTER — Encounter: Payer: Self-pay | Admitting: Cardiovascular Disease

## 2014-01-13 DIAGNOSIS — I7121 Aneurysm of the ascending aorta, without rupture: Secondary | ICD-10-CM

## 2014-01-13 DIAGNOSIS — I712 Thoracic aortic aneurysm, without rupture: Secondary | ICD-10-CM

## 2014-01-16 ENCOUNTER — Encounter (HOSPITAL_COMMUNITY): Payer: Self-pay

## 2014-01-17 ENCOUNTER — Ambulatory Visit: Payer: BC Managed Care – PPO | Admitting: Pharmacist Clinician (PhC)/ Clinical Pharmacy Specialist

## 2014-01-17 DIAGNOSIS — Z0279 Encounter for issue of other medical certificate: Secondary | ICD-10-CM

## 2014-01-20 ENCOUNTER — Encounter (HOSPITAL_COMMUNITY): Payer: Self-pay

## 2014-01-20 ENCOUNTER — Ambulatory Visit (HOSPITAL_COMMUNITY)
Admission: RE | Admit: 2014-01-20 | Discharge: 2014-01-20 | Disposition: A | Discharge status: Home / Self Care | Payer: BC Managed Care – PPO | Source: Ambulatory Visit | Attending: Cardiothoracic Surgery | Admitting: Cardiothoracic Surgery

## 2014-01-20 ENCOUNTER — Encounter (HOSPITAL_COMMUNITY)
Admission: RE | Admit: 2014-01-20 | Discharge: 2014-01-20 | Disposition: A | Discharge status: Home / Self Care | Payer: BC Managed Care – PPO | Source: Ambulatory Visit | Attending: Cardiothoracic Surgery | Admitting: Cardiothoracic Surgery

## 2014-01-20 DIAGNOSIS — Z79899 Other long term (current) drug therapy: Secondary | ICD-10-CM | POA: Insufficient documentation

## 2014-01-20 DIAGNOSIS — I7121 Aneurysm of the ascending aorta, without rupture: Secondary | ICD-10-CM

## 2014-01-20 DIAGNOSIS — Z01810 Encounter for preprocedural cardiovascular examination: Secondary | ICD-10-CM | POA: Diagnosis present

## 2014-01-20 DIAGNOSIS — R05 Cough: Secondary | ICD-10-CM | POA: Diagnosis not present

## 2014-01-20 DIAGNOSIS — I10 Essential (primary) hypertension: Secondary | ICD-10-CM | POA: Diagnosis not present

## 2014-01-20 DIAGNOSIS — Z7901 Long term (current) use of anticoagulants: Secondary | ICD-10-CM | POA: Insufficient documentation

## 2014-01-20 DIAGNOSIS — Z87891 Personal history of nicotine dependence: Secondary | ICD-10-CM | POA: Diagnosis not present

## 2014-01-20 DIAGNOSIS — Z954 Presence of other heart-valve replacement: Secondary | ICD-10-CM | POA: Insufficient documentation

## 2014-01-20 DIAGNOSIS — Z01812 Encounter for preprocedural laboratory examination: Secondary | ICD-10-CM | POA: Diagnosis present

## 2014-01-20 DIAGNOSIS — I712 Thoracic aortic aneurysm, without rupture, unspecified: Secondary | ICD-10-CM | POA: Insufficient documentation

## 2014-01-20 DIAGNOSIS — I779 Disorder of arteries and arterioles, unspecified: Secondary | ICD-10-CM | POA: Insufficient documentation

## 2014-01-20 DIAGNOSIS — Z7982 Long term (current) use of aspirin: Secondary | ICD-10-CM | POA: Diagnosis not present

## 2014-01-20 DIAGNOSIS — I359 Nonrheumatic aortic valve disorder, unspecified: Secondary | ICD-10-CM | POA: Diagnosis not present

## 2014-01-20 DIAGNOSIS — E785 Hyperlipidemia, unspecified: Secondary | ICD-10-CM | POA: Insufficient documentation

## 2014-01-20 DIAGNOSIS — R059 Cough, unspecified: Secondary | ICD-10-CM | POA: Diagnosis not present

## 2014-01-20 HISTORY — DX: Depression, unspecified: F32.A

## 2014-01-20 HISTORY — DX: Unspecified asthma, uncomplicated: J45.909

## 2014-01-20 HISTORY — DX: Anxiety disorder, unspecified: F41.9

## 2014-01-20 HISTORY — DX: Major depressive disorder, single episode, unspecified: F32.9

## 2014-01-20 LAB — PULMONARY FUNCTION TEST
DL/VA % pred: 112 %
DL/VA: 5.19 ml/min/mmHg/L
DLCO cor % pred: 107 %
DLCO cor: 34.84 ml/min/mmHg
DLCO unc % pred: 107 %
DLCO unc: 34.84 ml/min/mmHg
FEF 25-75 Post: 5.71 L/sec
FEF 25-75 Pre: 5.68 L/sec
FEF2575-%Change-Post: 0 %
FEF2575-%Pred-Post: 184 %
FEF2575-%Pred-Pre: 183 %
FEV1-%Change-Post: 1 %
FEV1-%Pred-Post: 112 %
FEV1-%Pred-Pre: 110 %
FEV1-Post: 4.16 L
FEV1-Pre: 4.09 L
FEV1FVC-%Change-Post: 1 %
FEV1FVC-%Pred-Pre: 112 %
FEV6-%Change-Post: 0 %
FEV6-%Pred-Post: 102 %
FEV6-%Pred-Pre: 102 %
FEV6-Post: 4.76 L
FEV6-Pre: 4.76 L
FEV6FVC-%Change-Post: 0 %
FEV6FVC-%Pred-Post: 103 %
FEV6FVC-%Pred-Pre: 104 %
FVC-%Change-Post: 0 %
FVC-%Pred-Post: 98 %
FVC-%Pred-Pre: 98 %
FVC-Post: 4.77 L
FVC-Pre: 4.76 L
Post FEV1/FVC ratio: 87 %
Post FEV6/FVC ratio: 100 %
Pre FEV1/FVC ratio: 86 %
Pre FEV6/FVC Ratio: 100 %
RV % pred: 101 %
RV: 2.22 L
TLC % pred: 102 %
TLC: 7.14 L

## 2014-01-20 LAB — HEMOGLOBIN A1C
Hgb A1c MFr Bld: 6.1 % — ABNORMAL HIGH (ref <5.7)
Mean Plasma Glucose: 128 mg/dL — ABNORMAL HIGH (ref <117)

## 2014-01-20 LAB — CBC
HCT: 42.6 % (ref 39.0–52.0)
Hemoglobin: 14.5 g/dL (ref 13.0–17.0)
MCH: 28.4 pg (ref 26.0–34.0)
MCHC: 34 g/dL (ref 30.0–36.0)
MCV: 83.4 fL (ref 78.0–100.0)
Platelets: 197 10*3/uL (ref 150–400)
RBC: 5.11 MIL/uL (ref 4.22–5.81)
RDW: 15.8 % — ABNORMAL HIGH (ref 11.5–15.5)
WBC: 8.8 10*3/uL (ref 4.0–10.5)

## 2014-01-20 LAB — BLOOD GAS, ARTERIAL
Acid-Base Excess: 0.5 mmol/L (ref 0.0–2.0)
Bicarbonate: 24.6 mEq/L — ABNORMAL HIGH (ref 20.0–24.0)
Drawn by: 206361
FIO2: 0.21 %
O2 Saturation: 97.6 %
Patient temperature: 98.6
TCO2: 25.8 mmol/L (ref 0–100)
pCO2 arterial: 39.4 mmHg (ref 35.0–45.0)
pH, Arterial: 7.412 (ref 7.350–7.450)
pO2, Arterial: 83.8 mmHg (ref 80.0–100.0)

## 2014-01-20 LAB — COMPREHENSIVE METABOLIC PANEL
ALT: 15 U/L (ref 0–53)
AST: 14 U/L (ref 0–37)
Albumin: 4 g/dL (ref 3.5–5.2)
Alkaline Phosphatase: 84 U/L (ref 39–117)
Anion gap: 15 (ref 5–15)
BUN: 15 mg/dL (ref 6–23)
CO2: 23 mEq/L (ref 19–32)
Calcium: 9.5 mg/dL (ref 8.4–10.5)
Chloride: 101 mEq/L (ref 96–112)
Creatinine, Ser: 0.76 mg/dL (ref 0.50–1.35)
GFR calc Af Amer: >90 mL/min (ref >90)
GFR calc non Af Amer: >90 mL/min (ref >90)
Glucose, Bld: 113 mg/dL — ABNORMAL HIGH (ref 70–99)
Potassium: 4 mEq/L (ref 3.7–5.3)
Sodium: 139 mEq/L (ref 137–147)
Total Bilirubin: 0.3 mg/dL (ref 0.3–1.2)
Total Protein: 7.1 g/dL (ref 6.0–8.3)

## 2014-01-20 LAB — URINALYSIS, ROUTINE W REFLEX MICROSCOPIC
Bilirubin Urine: NEGATIVE
Glucose, UA: NEGATIVE mg/dL
Hgb urine dipstick: NEGATIVE
Ketones, ur: NEGATIVE mg/dL
Leukocytes, UA: NEGATIVE
Nitrite: NEGATIVE
Protein, ur: NEGATIVE mg/dL
Specific Gravity, Urine: 1.011 (ref 1.005–1.030)
Urobilinogen, UA: 0.2 mg/dL (ref 0.0–1.0)
pH: 7 (ref 5.0–8.0)

## 2014-01-20 LAB — SURGICAL PCR SCREEN
MRSA, PCR: POSITIVE — AB
Staphylococcus aureus: POSITIVE — AB

## 2014-01-20 LAB — PROTIME-INR
INR: 1.55 — ABNORMAL HIGH (ref 0.00–1.49)
Prothrombin Time: 18.6 seconds — ABNORMAL HIGH (ref 11.6–15.2)

## 2014-01-20 LAB — ABO/RH: ABO/RH(D): B POS

## 2014-01-20 LAB — APTT: aPTT: 36 seconds (ref 24–37)

## 2014-01-20 MED ORDER — ALBUTEROL SULFATE (2.5 MG/3ML) 0.083% IN NEBU
2.5000 mg | INHALATION_SOLUTION | Freq: Once | RESPIRATORY_TRACT | Status: AC
  Administered 2014-01-20: 2.5 mg via RESPIRATORY_TRACT

## 2014-01-20 MED ORDER — CHLORHEXIDINE GLUCONATE 4 % EX LIQD: 30.0000 mL | CUTANEOUS | Status: DC

## 2014-01-20 NOTE — Progress Notes (Signed)
Pre-op Cardiac Surgery  Carotid Findings:  Bilateral:  1-39% ICA stenosis.  Vertebral artery flow is antegrade.      Upper Extremity Right Left  Brachial Pressures 119 119  Radial Waveforms Tri Tri  Ulnar Waveforms Tri Tri  Palmar Arch (Allen's Test) Normal  Normal    Farrel Demark, RDMS, RVT 01/20/2014

## 2014-01-20 NOTE — Pre-Procedure Instructions (Signed)
[name] [name]  01/20/2014   Your procedure is scheduled on:  01/23/14  Report to [facility] Admitting at 530 AM.  Call this number if you have problems the morning of surgery: [phone]   Remember:   Do not eat food or drink liquids after midnight.   Take these medicines the morning of surgery with A SIP OF WATER: all inhalers,xanax   Do not wear jewelry, make-up or nail polish.  Do not wear lotions, powders, or perfumes. You may wear deodorant.  Do not shave 48 hours prior to surgery. Men may shave face and neck.  Do not bring valuables to the hospital.  [facility] is not responsible                  for any belongings or valuables.               Contacts, dentures or bridgework may not be worn into surgery.  Leave suitcase in the car. After surgery it may be brought to your room.  For patients admitted to the hospital, discharge time is determined by your                treatment team.               Patients discharged the day of surgery will not be allowed to drive  home.  Name and phone number of your driver: family  Special Instructions: Shower using CHG 2 nights before surgery and the night before surgery.  If you shower the day of surgery use CHG.  Use special wash - you have one bottle of CHG for all showers.  You should use approximately 1/3 of the bottle for each shower.   Please read over the following fact sheets that you were given: Pain Booklet, Coughing and Deep Breathing, Blood Transfusion Information, MRSA Information and Surgical Site Infection Prevention

## 2014-01-22 MED ORDER — PLASMA-LYTE 148 IV SOLN
INTRAVENOUS | Status: DC
  Filled 2014-01-22: qty 2.5

## 2014-01-22 MED ORDER — SODIUM CHLORIDE 0.9 % IV SOLN
INTRAVENOUS | Status: DC
  Filled 2014-01-22: qty 40

## 2014-01-22 MED ORDER — MAGNESIUM SULFATE 50 % IJ SOLN
40.0000 meq | INTRAMUSCULAR | Status: DC
  Filled 2014-01-22: qty 10

## 2014-01-22 MED ORDER — DEXMEDETOMIDINE HCL IN NACL 400 MCG/100ML IV SOLN
0.1000 ug/kg/h | INTRAVENOUS | Status: DC
  Filled 2014-01-22: qty 100

## 2014-01-22 MED ORDER — DOPAMINE-DEXTROSE 3.2-5 MG/ML-% IV SOLN
2.0000 ug/kg/min | INTRAVENOUS | Status: DC
  Filled 2014-01-22: qty 250

## 2014-01-22 MED ORDER — METOPROLOL TARTRATE 12.5 MG HALF TABLET
12.5000 mg | ORAL_TABLET | Freq: Once | ORAL | Status: AC
  Administered 2014-01-23: 12.5 mg via ORAL
  Filled 2014-01-22: qty 1

## 2014-01-22 MED ORDER — POTASSIUM CHLORIDE 2 MEQ/ML IV SOLN
80.0000 meq | INTRAVENOUS | Status: DC
  Filled 2014-01-22: qty 40

## 2014-01-22 MED ORDER — SODIUM CHLORIDE 0.9 % IV SOLN
INTRAVENOUS | Status: DC
  Filled 2014-01-22: qty 30

## 2014-01-22 MED ORDER — DEXTROSE 5 % IV SOLN
750.0000 mg | INTRAVENOUS | Status: DC
  Filled 2014-01-22: qty 750

## 2014-01-22 MED ORDER — SODIUM CHLORIDE 0.9 % IV SOLN
INTRAVENOUS | Status: DC
  Filled 2014-01-22: qty 2.5

## 2014-01-22 MED ORDER — DEXTROSE 5 % IV SOLN
1.5000 g | INTRAVENOUS | Status: AC
  Administered 2014-01-23: 1.5 g via INTRAVENOUS
  Administered 2014-01-23: .75 g via INTRAVENOUS
  Filled 2014-01-22 (×2): qty 1.5

## 2014-01-22 MED ORDER — EPINEPHRINE HCL 1 MG/ML IJ SOLN
0.5000 ug/min | INTRAVENOUS | Status: DC
  Filled 2014-01-22: qty 4

## 2014-01-22 MED ORDER — NITROGLYCERIN IN D5W 200-5 MCG/ML-% IV SOLN
2.0000 ug/min | INTRAVENOUS | Status: DC
  Filled 2014-01-22: qty 250

## 2014-01-22 MED ORDER — PHENYLEPHRINE HCL 10 MG/ML IJ SOLN
30.0000 ug/min | INTRAVENOUS | Status: DC
  Filled 2014-01-22: qty 2

## 2014-01-22 MED ORDER — VANCOMYCIN HCL 10 G IV SOLR
1500.0000 mg | INTRAVENOUS | Status: AC
  Administered 2014-01-23: 1500 mg via INTRAVENOUS
  Filled 2014-01-22: qty 1500

## 2014-01-23 ENCOUNTER — Encounter (HOSPITAL_COMMUNITY)
Admission: RE | Disposition: A | Discharge status: Home Health | Payer: BC Managed Care – PPO | Source: Ambulatory Visit | Attending: Cardiothoracic Surgery

## 2014-01-23 ENCOUNTER — Inpatient Hospital Stay (HOSPITAL_COMMUNITY): Payer: BC Managed Care – PPO | Admitting: Certified Registered Nurse Anesthetist

## 2014-01-23 ENCOUNTER — Encounter (HOSPITAL_COMMUNITY): Payer: BC Managed Care – PPO | Admitting: Certified Registered Nurse Anesthetist

## 2014-01-23 ENCOUNTER — Inpatient Hospital Stay (HOSPITAL_COMMUNITY)
Admission: RE | Admit: 2014-01-23 | Discharge: 2014-01-29 | DRG: 220 | Disposition: A | Discharge status: Home Health | Payer: BC Managed Care – PPO | Source: Ambulatory Visit | Attending: Cardiothoracic Surgery | Admitting: Cardiothoracic Surgery

## 2014-01-23 ENCOUNTER — Encounter (HOSPITAL_COMMUNITY): Payer: Self-pay | Admitting: *Deleted

## 2014-01-23 ENCOUNTER — Inpatient Hospital Stay (HOSPITAL_COMMUNITY): Payer: BC Managed Care – PPO

## 2014-01-23 DIAGNOSIS — D62 Acute posthemorrhagic anemia: Secondary | ICD-10-CM | POA: Diagnosis not present

## 2014-01-23 DIAGNOSIS — Z952 Presence of prosthetic heart valve: Secondary | ICD-10-CM

## 2014-01-23 DIAGNOSIS — D696 Thrombocytopenia, unspecified: Secondary | ICD-10-CM | POA: Diagnosis not present

## 2014-01-23 DIAGNOSIS — Z87891 Personal history of nicotine dependence: Secondary | ICD-10-CM

## 2014-01-23 DIAGNOSIS — D689 Coagulation defect, unspecified: Secondary | ICD-10-CM | POA: Diagnosis not present

## 2014-01-23 DIAGNOSIS — E119 Type 2 diabetes mellitus without complications: Secondary | ICD-10-CM | POA: Diagnosis present

## 2014-01-23 DIAGNOSIS — E785 Hyperlipidemia, unspecified: Secondary | ICD-10-CM | POA: Diagnosis present

## 2014-01-23 DIAGNOSIS — J9811 Atelectasis: Secondary | ICD-10-CM | POA: Diagnosis not present

## 2014-01-23 DIAGNOSIS — I10 Essential (primary) hypertension: Secondary | ICD-10-CM | POA: Diagnosis present

## 2014-01-23 DIAGNOSIS — I2510 Atherosclerotic heart disease of native coronary artery without angina pectoris: Secondary | ICD-10-CM | POA: Diagnosis present

## 2014-01-23 DIAGNOSIS — I4891 Unspecified atrial fibrillation: Secondary | ICD-10-CM | POA: Diagnosis not present

## 2014-01-23 DIAGNOSIS — K5900 Constipation, unspecified: Secondary | ICD-10-CM | POA: Diagnosis present

## 2014-01-23 DIAGNOSIS — I712 Thoracic aortic aneurysm, without rupture: Secondary | ICD-10-CM

## 2014-01-23 DIAGNOSIS — I7121 Aneurysm of the ascending aorta, without rupture: Secondary | ICD-10-CM

## 2014-01-23 DIAGNOSIS — R451 Restlessness and agitation: Secondary | ICD-10-CM | POA: Diagnosis not present

## 2014-01-23 HISTORY — PX: INTRAOPERATIVE TRANSESOPHAGEAL ECHOCARDIOGRAM: SHX5062

## 2014-01-23 HISTORY — PX: AORTIC VALVE REPLACEMENT: SHX41

## 2014-01-23 HISTORY — PX: BENTALL PROCEDURE: SHX5058

## 2014-01-23 LAB — POCT I-STAT 3, ART BLOOD GAS (G3+)
Acid-Base Excess: 3 mmol/L — ABNORMAL HIGH (ref 0.0–2.0)
Acid-base deficit: 1 mmol/L (ref 0.0–2.0)
Bicarbonate: 29.3 mEq/L — ABNORMAL HIGH (ref 20.0–24.0)
Bicarbonate: 26.7 mEq/L — ABNORMAL HIGH (ref 20.0–24.0)
Bicarbonate: 27.1 mEq/L — ABNORMAL HIGH (ref 20.0–24.0)
Bicarbonate: 25.7 mEq/L — ABNORMAL HIGH (ref 20.0–24.0)
O2 Saturation: 100 %
O2 Saturation: 98 %
O2 Saturation: 98 %
O2 Saturation: 89 %
Patient temperature: 35.9
TCO2: 31 mmol/L (ref 0–100)
TCO2: 28 mmol/L (ref 0–100)
TCO2: 29 mmol/L (ref 0–100)
TCO2: 27 mmol/L (ref 0–100)
pCO2 arterial: 55.3 mmHg — ABNORMAL HIGH (ref 35.0–45.0)
pCO2 arterial: 56 mmHg — ABNORMAL HIGH (ref 35.0–45.0)
pCO2 arterial: 61.4 mmHg (ref 35.0–45.0)
pCO2 arterial: 42.8 mmHg (ref 35.0–45.0)
pH, Arterial: 7.332 — ABNORMAL LOW (ref 7.350–7.450)
pH, Arterial: 7.246 — ABNORMAL LOW (ref 7.350–7.450)
pH, Arterial: 7.292 — ABNORMAL LOW (ref 7.350–7.450)
pH, Arterial: 7.382 (ref 7.350–7.450)
pO2, Arterial: 437 mmHg — ABNORMAL HIGH (ref 80.0–100.0)
pO2, Arterial: 113 mmHg — ABNORMAL HIGH (ref 80.0–100.0)
pO2, Arterial: 119 mmHg — ABNORMAL HIGH (ref 80.0–100.0)
pO2, Arterial: 55 mmHg — ABNORMAL LOW (ref 80.0–100.0)

## 2014-01-23 LAB — POCT I-STAT, CHEM 8
BUN: 21 mg/dL (ref 6–23)
BUN: 19 mg/dL (ref 6–23)
BUN: 18 mg/dL (ref 6–23)
BUN: 19 mg/dL (ref 6–23)
BUN: 21 mg/dL (ref 6–23)
BUN: 19 mg/dL (ref 6–23)
BUN: 18 mg/dL (ref 6–23)
BUN: 16 mg/dL (ref 6–23)
BUN: 17 mg/dL (ref 6–23)
Calcium, Ion: 1.33 mmol/L — ABNORMAL HIGH (ref 1.12–1.23)
Calcium, Ion: 1.3 mmol/L — ABNORMAL HIGH (ref 1.12–1.23)
Calcium, Ion: 1.16 mmol/L (ref 1.12–1.23)
Calcium, Ion: 1.07 mmol/L — ABNORMAL LOW (ref 1.12–1.23)
Calcium, Ion: 1.06 mmol/L — ABNORMAL LOW (ref 1.12–1.23)
Calcium, Ion: 1.04 mmol/L — ABNORMAL LOW (ref 1.12–1.23)
Calcium, Ion: 0.98 mmol/L — ABNORMAL LOW (ref 1.12–1.23)
Calcium, Ion: 1.12 mmol/L (ref 1.12–1.23)
Calcium, Ion: 1.12 mmol/L (ref 1.12–1.23)
Chloride: 103 mEq/L (ref 96–112)
Chloride: 104 mEq/L (ref 96–112)
Chloride: 102 mEq/L (ref 96–112)
Chloride: 100 mEq/L (ref 96–112)
Chloride: 101 mEq/L (ref 96–112)
Chloride: 102 mEq/L (ref 96–112)
Chloride: 102 mEq/L (ref 96–112)
Chloride: 100 mEq/L (ref 96–112)
Chloride: 103 mEq/L (ref 96–112)
Creatinine, Ser: 0.8 mg/dL (ref 0.50–1.35)
Creatinine, Ser: 0.8 mg/dL (ref 0.50–1.35)
Creatinine, Ser: 0.7 mg/dL (ref 0.50–1.35)
Creatinine, Ser: 0.6 mg/dL (ref 0.50–1.35)
Creatinine, Ser: 0.5 mg/dL (ref 0.50–1.35)
Creatinine, Ser: 0.5 mg/dL (ref 0.50–1.35)
Creatinine, Ser: 0.5 mg/dL (ref 0.50–1.35)
Creatinine, Ser: 0.8 mg/dL (ref 0.50–1.35)
Creatinine, Ser: 0.8 mg/dL (ref 0.50–1.35)
Glucose, Bld: 103 mg/dL — ABNORMAL HIGH (ref 70–99)
Glucose, Bld: 114 mg/dL — ABNORMAL HIGH (ref 70–99)
Glucose, Bld: 125 mg/dL — ABNORMAL HIGH (ref 70–99)
Glucose, Bld: 157 mg/dL — ABNORMAL HIGH (ref 70–99)
Glucose, Bld: 171 mg/dL — ABNORMAL HIGH (ref 70–99)
Glucose, Bld: 146 mg/dL — ABNORMAL HIGH (ref 70–99)
Glucose, Bld: 145 mg/dL — ABNORMAL HIGH (ref 70–99)
Glucose, Bld: 138 mg/dL — ABNORMAL HIGH (ref 70–99)
Glucose, Bld: 141 mg/dL — ABNORMAL HIGH (ref 70–99)
HCT: 39 % (ref 39.0–52.0)
HCT: 34 % — ABNORMAL LOW (ref 39.0–52.0)
HCT: 29 % — ABNORMAL LOW (ref 39.0–52.0)
HCT: 30 % — ABNORMAL LOW (ref 39.0–52.0)
HCT: 29 % — ABNORMAL LOW (ref 39.0–52.0)
HCT: 29 % — ABNORMAL LOW (ref 39.0–52.0)
HCT: 26 % — ABNORMAL LOW (ref 39.0–52.0)
HCT: 26 % — ABNORMAL LOW (ref 39.0–52.0)
HCT: 27 % — ABNORMAL LOW (ref 39.0–52.0)
Hemoglobin: 13.3 g/dL (ref 13.0–17.0)
Hemoglobin: 11.6 g/dL — ABNORMAL LOW (ref 13.0–17.0)
Hemoglobin: 9.9 g/dL — ABNORMAL LOW (ref 13.0–17.0)
Hemoglobin: 10.2 g/dL — ABNORMAL LOW (ref 13.0–17.0)
Hemoglobin: 9.9 g/dL — ABNORMAL LOW (ref 13.0–17.0)
Hemoglobin: 9.9 g/dL — ABNORMAL LOW (ref 13.0–17.0)
Hemoglobin: 8.8 g/dL — ABNORMAL LOW (ref 13.0–17.0)
Hemoglobin: 8.8 g/dL — ABNORMAL LOW (ref 13.0–17.0)
Hemoglobin: 9.2 g/dL — ABNORMAL LOW (ref 13.0–17.0)
Potassium: 3.7 mEq/L (ref 3.7–5.3)
Potassium: 3.9 mEq/L (ref 3.7–5.3)
Potassium: 3.5 mEq/L — ABNORMAL LOW (ref 3.7–5.3)
Potassium: 5 mEq/L (ref 3.7–5.3)
Potassium: 5 mEq/L (ref 3.7–5.3)
Potassium: 4.5 mEq/L (ref 3.7–5.3)
Potassium: 4.1 mEq/L (ref 3.7–5.3)
Potassium: 3.2 mEq/L — ABNORMAL LOW (ref 3.7–5.3)
Potassium: 3.4 mEq/L — ABNORMAL LOW (ref 3.7–5.3)
Sodium: 140 mEq/L (ref 137–147)
Sodium: 139 mEq/L (ref 137–147)
Sodium: 139 mEq/L (ref 137–147)
Sodium: 135 mEq/L — ABNORMAL LOW (ref 137–147)
Sodium: 136 mEq/L — ABNORMAL LOW (ref 137–147)
Sodium: 136 mEq/L — ABNORMAL LOW (ref 137–147)
Sodium: 139 mEq/L (ref 137–147)
Sodium: 140 mEq/L (ref 137–147)
Sodium: 141 mEq/L (ref 137–147)
TCO2: 26 mmol/L (ref 0–100)
TCO2: 27 mmol/L (ref 0–100)
TCO2: 27 mmol/L (ref 0–100)
TCO2: 26 mmol/L (ref 0–100)
TCO2: 26 mmol/L (ref 0–100)
TCO2: 25 mmol/L (ref 0–100)
TCO2: 21 mmol/L (ref 0–100)
TCO2: 25 mmol/L (ref 0–100)
TCO2: 26 mmol/L (ref 0–100)

## 2014-01-23 LAB — POCT I-STAT GLUCOSE
Glucose, Bld: 111 mg/dL — ABNORMAL HIGH (ref 70–99)
Operator id: 333012

## 2014-01-23 LAB — CBC
HEMATOCRIT: 32.4 % — AB (ref 39.0–52.0)
HEMOGLOBIN: 11.1 g/dL — AB (ref 13.0–17.0)
MCH: 28.3 pg (ref 26.0–34.0)
MCHC: 34.3 g/dL (ref 30.0–36.0)
MCV: 82.7 fL (ref 78.0–100.0)
Platelets: 101 10*3/uL — ABNORMAL LOW (ref 150–400)
RBC: 3.92 MIL/uL — AB (ref 4.22–5.81)
RDW: 15.5 % (ref 11.5–15.5)
WBC: 16.4 10*3/uL — ABNORMAL HIGH (ref 4.0–10.5)

## 2014-01-23 LAB — HEMOGLOBIN AND HEMATOCRIT, BLOOD
HCT: 29.5 % — ABNORMAL LOW (ref 39.0–52.0)
Hemoglobin: 10 g/dL — ABNORMAL LOW (ref 13.0–17.0)

## 2014-01-23 LAB — PROTIME-INR
INR: 1.46 (ref 0.00–1.49)
Prothrombin Time: 17.7 seconds — ABNORMAL HIGH (ref 11.6–15.2)

## 2014-01-23 LAB — POCT I-STAT 4, (NA,K, GLUC, HGB,HCT)
Glucose, Bld: 105 mg/dL — ABNORMAL HIGH (ref 70–99)
HCT: 32 % — ABNORMAL LOW (ref 39.0–52.0)
Hemoglobin: 10.9 g/dL — ABNORMAL LOW (ref 13.0–17.0)
Potassium: 3.5 mEq/L — ABNORMAL LOW (ref 3.7–5.3)
Sodium: 141 mEq/L (ref 137–147)

## 2014-01-23 LAB — APTT: aPTT: 27 seconds (ref 24–37)

## 2014-01-23 LAB — GLUCOSE, CAPILLARY
Glucose-Capillary: 110 mg/dL — ABNORMAL HIGH (ref 70–99)
Glucose-Capillary: 104 mg/dL — ABNORMAL HIGH (ref 70–99)

## 2014-01-23 LAB — FIBRINOGEN: Fibrinogen: 170 mg/dL — ABNORMAL LOW (ref 204–475)

## 2014-01-23 LAB — PLATELET COUNT: Platelets: 98 10*3/uL — ABNORMAL LOW (ref 150–400)

## 2014-01-23 SURGERY — BENTALL PROCEDURE
Anesthesia: General | Site: Chest

## 2014-01-23 MED ORDER — MILRINONE IN DEXTROSE 20 MG/100ML IV SOLN
0.3000 ug/kg/min | INTRAVENOUS | Status: DC
  Administered 2014-01-23 – 2014-01-25 (×3): 0.3 ug/kg/min via INTRAVENOUS
  Filled 2014-01-23 (×4): qty 100

## 2014-01-23 MED ORDER — PANTOPRAZOLE SODIUM 40 MG PO TBEC
40.0000 mg | DELAYED_RELEASE_TABLET | Freq: Every day | ORAL | Status: DC
  Administered 2014-01-25 – 2014-01-26 (×2): 40 mg via ORAL
  Filled 2014-01-23 (×2): qty 1

## 2014-01-23 MED ORDER — VANCOMYCIN HCL IN DEXTROSE 1-5 GM/200ML-% IV SOLN
1000.0000 mg | INTRAVENOUS | Status: AC
  Administered 2014-01-23 – 2014-01-24 (×2): 1000 mg via INTRAVENOUS
  Filled 2014-01-23 (×3): qty 200

## 2014-01-23 MED ORDER — ROCURONIUM BROMIDE 50 MG/5ML IV SOLN
INTRAVENOUS | Status: AC
  Filled 2014-01-23: qty 2

## 2014-01-23 MED ORDER — PROPOFOL 10 MG/ML IV BOLUS
INTRAVENOUS | Status: DC | PRN
  Administered 2014-01-23: 70 mg via INTRAVENOUS

## 2014-01-23 MED ORDER — LACTATED RINGERS IV SOLN
INTRAVENOUS | Status: DC | PRN
  Administered 2014-01-23 (×2): via INTRAVENOUS

## 2014-01-23 MED ORDER — INSULIN REGULAR BOLUS VIA INFUSION
0.0000 [IU] | Freq: Three times a day (TID) | INTRAVENOUS | Status: DC
  Filled 2014-01-23: qty 10

## 2014-01-23 MED ORDER — SODIUM CHLORIDE 0.9 % IV SOLN
10.0000 g | INTRAVENOUS | Status: DC | PRN
  Administered 2014-01-23: 5 g/h via INTRAVENOUS

## 2014-01-23 MED ORDER — ONDANSETRON HCL 4 MG/2ML IJ SOLN
4.0000 mg | Freq: Four times a day (QID) | INTRAMUSCULAR | Status: DC | PRN
  Administered 2014-01-24 – 2014-01-25 (×3): 4 mg via INTRAVENOUS
  Filled 2014-01-23 (×3): qty 2

## 2014-01-23 MED ORDER — FENTANYL CITRATE 0.05 MG/ML IJ SOLN
INTRAMUSCULAR | Status: DC | PRN
  Administered 2014-01-23: 250 ug via INTRAVENOUS
  Administered 2014-01-23: 150 ug via INTRAVENOUS
  Administered 2014-01-23: 200 ug via INTRAVENOUS
  Administered 2014-01-23: 150 ug via INTRAVENOUS
  Administered 2014-01-23 (×3): 100 ug via INTRAVENOUS
  Administered 2014-01-23: 50 ug via INTRAVENOUS
  Administered 2014-01-23: 150 ug via INTRAVENOUS
  Administered 2014-01-23: 250 ug via INTRAVENOUS
  Administered 2014-01-23 (×2): 100 ug via INTRAVENOUS
  Administered 2014-01-23: 50 ug via INTRAVENOUS

## 2014-01-23 MED ORDER — FENTANYL CITRATE 0.05 MG/ML IJ SOLN
INTRAMUSCULAR | Status: AC
  Filled 2014-01-23: qty 5

## 2014-01-23 MED ORDER — ACETAMINOPHEN 160 MG/5ML PO SOLN
1000.0000 mg | Freq: Four times a day (QID) | ORAL | Status: DC
Start: 2014-01-24 — End: 2014-01-26
  Administered 2014-01-24: 1000 mg

## 2014-01-23 MED ORDER — LIDOCAINE HCL (CARDIAC) 20 MG/ML IV SOLN
INTRAVENOUS | Status: DC | PRN
  Administered 2014-01-23: 80 mg via INTRAVENOUS

## 2014-01-23 MED ORDER — ACETAMINOPHEN 650 MG RE SUPP: 650.0000 mg | Freq: Once | RECTAL | Status: DC

## 2014-01-23 MED ORDER — EPINEPHRINE HCL 1 MG/ML IJ SOLN
4000.0000 ug | INTRAVENOUS | Status: DC | PRN
  Administered 2014-01-23: 4 ug/kg/min via INTRAVENOUS

## 2014-01-23 MED ORDER — PHENYLEPHRINE HCL 10 MG/ML IJ SOLN
0.0000 ug/min | INTRAVENOUS | Status: DC
  Administered 2014-01-23: 20 ug/min via INTRAVENOUS
  Filled 2014-01-23 (×2): qty 2

## 2014-01-23 MED ORDER — DEXTROSE 5 % IV SOLN
1.5000 g | Freq: Two times a day (BID) | INTRAVENOUS | Status: AC
  Administered 2014-01-23 – 2014-01-25 (×4): 1.5 g via INTRAVENOUS
  Filled 2014-01-23 (×5): qty 1.5

## 2014-01-23 MED ORDER — SODIUM CHLORIDE 0.9 % IV SOLN
INTRAVENOUS | Status: DC
  Administered 2014-01-26: 05:00:00 via INTRAVENOUS

## 2014-01-23 MED ORDER — MILRINONE IN DEXTROSE 20 MG/100ML IV SOLN
0.1250 ug/kg/min | INTRAVENOUS | Status: DC
  Filled 2014-01-23: qty 100

## 2014-01-23 MED ORDER — HEPARIN (PORCINE) IN NACL 2-0.9 UNIT/ML-% IJ SOLN
INTRAMUSCULAR | Status: DC | PRN
  Administered 2014-01-23: 1 via INTRAVENOUS

## 2014-01-23 MED ORDER — LACTATED RINGERS IV SOLN: 500.0000 mL | Freq: Once | INTRAVENOUS | Status: DC | PRN

## 2014-01-23 MED ORDER — SODIUM CHLORIDE 0.45 % IV SOLN
INTRAVENOUS | Status: DC
  Administered 2014-01-23: 18:00:00 via INTRAVENOUS

## 2014-01-23 MED ORDER — LACTATED RINGERS IV SOLN
INTRAVENOUS | Status: DC | PRN
  Administered 2014-01-23 (×3): via INTRAVENOUS

## 2014-01-23 MED ORDER — SODIUM CHLORIDE 0.9 % IV SOLN: Freq: Once | INTRAVENOUS | Status: DC

## 2014-01-23 MED ORDER — CALCIUM CHLORIDE 10 % IV SOLN
INTRAVENOUS | Status: AC
  Filled 2014-01-23: qty 10

## 2014-01-23 MED ORDER — EPINEPHRINE HCL 1 MG/ML IJ SOLN
5.0000 ug/min | INTRAVENOUS | Status: DC
  Administered 2014-01-23: 3 ug/min via INTRAVENOUS
  Administered 2014-01-24: 2.5 ug/min via INTRAVENOUS
  Administered 2014-01-24: 3 ug/min via INTRAVENOUS
  Filled 2014-01-23 (×3): qty 1

## 2014-01-23 MED ORDER — MORPHINE SULFATE 2 MG/ML IJ SOLN
2.0000 mg | INTRAMUSCULAR | Status: DC | PRN
  Administered 2014-01-23 – 2014-01-24 (×4): 2 mg via INTRAVENOUS
  Filled 2014-01-23 (×4): qty 1

## 2014-01-23 MED ORDER — NITROGLYCERIN IN D5W 200-5 MCG/ML-% IV SOLN
INTRAVENOUS | Status: DC | PRN
  Administered 2014-01-23: 5 ug/min via INTRAVENOUS

## 2014-01-23 MED ORDER — TRAMADOL HCL 50 MG PO TABS: 50.0000 mg | ORAL_TABLET | ORAL | Status: DC | PRN

## 2014-01-23 MED ORDER — ASPIRIN EC 325 MG PO TBEC
325.0000 mg | DELAYED_RELEASE_TABLET | Freq: Every day | ORAL | Status: DC
  Administered 2014-01-26: 325 mg via ORAL
  Filled 2014-01-23 (×3): qty 1

## 2014-01-23 MED ORDER — FAMOTIDINE IN NACL 20-0.9 MG/50ML-% IV SOLN
20.0000 mg | Freq: Two times a day (BID) | INTRAVENOUS | Status: AC
  Administered 2014-01-23 – 2014-01-24 (×2): 20 mg via INTRAVENOUS
  Filled 2014-01-23: qty 50

## 2014-01-23 MED ORDER — CALCIUM CHLORIDE 10 % IV SOLN
INTRAVENOUS | Status: AC
  Filled 2014-01-23: qty 20

## 2014-01-23 MED ORDER — MIDAZOLAM HCL 2 MG/2ML IJ SOLN
2.0000 mg | INTRAMUSCULAR | Status: DC | PRN
  Administered 2014-01-23 – 2014-01-24 (×5): 2 mg via INTRAVENOUS
  Filled 2014-01-23 (×5): qty 2

## 2014-01-23 MED ORDER — METOPROLOL TARTRATE 1 MG/ML IV SOLN: 2.5000 mg | INTRAVENOUS | Status: DC | PRN

## 2014-01-23 MED ORDER — METOPROLOL TARTRATE 25 MG/10 ML ORAL SUSPENSION
12.5000 mg | Freq: Two times a day (BID) | ORAL | Status: DC
  Filled 2014-01-23 (×5): qty 5

## 2014-01-23 MED ORDER — EZETIMIBE-SIMVASTATIN 10-20 MG PO TABS
1.0000 | ORAL_TABLET | Freq: Every day | ORAL | Status: DC
  Administered 2014-01-24 – 2014-01-28 (×5): 1 via ORAL
  Filled 2014-01-23 (×6): qty 1

## 2014-01-23 MED ORDER — MIDAZOLAM HCL 10 MG/2ML IJ SOLN
INTRAMUSCULAR | Status: AC
  Filled 2014-01-23: qty 2

## 2014-01-23 MED ORDER — PHENYLEPHRINE HCL 10 MG/ML IJ SOLN
INTRAMUSCULAR | Status: AC
  Filled 2014-01-23: qty 1

## 2014-01-23 MED ORDER — CALCIUM CHLORIDE 10 % IV SOLN
INTRAVENOUS | Status: DC | PRN
  Administered 2014-01-23: 700 mg via INTRAVENOUS
  Administered 2014-01-23: 300 mg via INTRAVENOUS

## 2014-01-23 MED ORDER — PROTAMINE SULFATE 10 MG/ML IV SOLN
INTRAVENOUS | Status: DC | PRN
  Administered 2014-01-23 (×4): 30 mg via INTRAVENOUS
  Administered 2014-01-23: 40 mg via INTRAVENOUS
  Administered 2014-01-23 (×2): 30 mg via INTRAVENOUS
  Administered 2014-01-23: 40 mg via INTRAVENOUS
  Administered 2014-01-23: 30 mg via INTRAVENOUS

## 2014-01-23 MED ORDER — POTASSIUM CHLORIDE 10 MEQ/50ML IV SOLN
10.0000 meq | INTRAVENOUS | Status: AC
  Administered 2014-01-23 (×4): 10 meq via INTRAVENOUS

## 2014-01-23 MED ORDER — MIDAZOLAM HCL 5 MG/5ML IJ SOLN
INTRAMUSCULAR | Status: DC | PRN
  Administered 2014-01-23: 5 mg via INTRAVENOUS
  Administered 2014-01-23: 2 mg via INTRAVENOUS
  Administered 2014-01-23 (×3): 1 mg via INTRAVENOUS

## 2014-01-23 MED ORDER — ASPIRIN 81 MG PO CHEW
324.0000 mg | CHEWABLE_TABLET | Freq: Every day | ORAL | Status: DC
Start: 2014-01-24 — End: 2014-01-26
  Administered 2014-01-24: 324 mg
  Filled 2014-01-23: qty 4

## 2014-01-23 MED ORDER — SODIUM CHLORIDE 0.9 % IV SOLN
1.0000 g/h | INTRAVENOUS | Status: DC
  Filled 2014-01-23: qty 20

## 2014-01-23 MED ORDER — SODIUM CHLORIDE 0.9 % IJ SOLN
3.0000 mL | INTRAMUSCULAR | Status: DC | PRN
  Administered 2014-01-24: 3 mL via INTRAVENOUS

## 2014-01-23 MED ORDER — ALBUMIN HUMAN 5 % IV SOLN
INTRAVENOUS | Status: DC | PRN
  Administered 2014-01-23 (×3): via INTRAVENOUS

## 2014-01-23 MED ORDER — HEMOSTATIC AGENTS (NO CHARGE) OPTIME
TOPICAL | Status: DC | PRN
  Administered 2014-01-23 (×2): 1 via TOPICAL

## 2014-01-23 MED ORDER — PROPOFOL 10 MG/ML IV BOLUS
INTRAVENOUS | Status: AC
  Filled 2014-01-23: qty 20

## 2014-01-23 MED ORDER — ACETAMINOPHEN 160 MG/5ML PO SOLN
650.0000 mg | Freq: Once | ORAL | Status: DC
  Filled 2014-01-23: qty 20.3

## 2014-01-23 MED ORDER — MAGNESIUM SULFATE 4000MG/100ML IJ SOLN
4.0000 g | Freq: Once | INTRAMUSCULAR | Status: AC
  Administered 2014-01-23: 4 g via INTRAVENOUS
  Filled 2014-01-23: qty 100

## 2014-01-23 MED ORDER — BUDESONIDE-FORMOTEROL FUMARATE 160-4.5 MCG/ACT IN AERO
2.0000 | INHALATION_SPRAY | Freq: Two times a day (BID) | RESPIRATORY_TRACT | Status: DC
  Administered 2014-01-24 – 2014-01-29 (×9): 2 via RESPIRATORY_TRACT
  Filled 2014-01-23: qty 6

## 2014-01-23 MED ORDER — MILRINONE IN DEXTROSE 20 MG/100ML IV SOLN
INTRAVENOUS | Status: DC | PRN
  Administered 2014-01-23: 0.375 ug/kg/min via INTRAVENOUS

## 2014-01-23 MED ORDER — SODIUM CHLORIDE 0.9 % IJ SOLN
INTRAMUSCULAR | Status: DC | PRN
  Administered 2014-01-23 (×2): via TOPICAL

## 2014-01-23 MED ORDER — DOCUSATE SODIUM 100 MG PO CAPS
200.0000 mg | ORAL_CAPSULE | Freq: Every day | ORAL | Status: DC
  Administered 2014-01-25 – 2014-01-26 (×2): 200 mg via ORAL
  Filled 2014-01-23 (×2): qty 2

## 2014-01-23 MED ORDER — METHYLPREDNISOLONE SODIUM SUCC 125 MG IJ SOLR
INTRAMUSCULAR | Status: AC
  Filled 2014-01-23: qty 2

## 2014-01-23 MED ORDER — LEVALBUTEROL HCL 1.25 MG/0.5ML IN NEBU
1.2500 mg | INHALATION_SOLUTION | Freq: Four times a day (QID) | RESPIRATORY_TRACT | Status: DC
  Administered 2014-01-24 – 2014-01-25 (×5): 1.25 mg via RESPIRATORY_TRACT
  Filled 2014-01-23 (×12): qty 0.5

## 2014-01-23 MED ORDER — MORPHINE SULFATE 2 MG/ML IJ SOLN
1.0000 mg | INTRAMUSCULAR | Status: AC | PRN
  Administered 2014-01-23 – 2014-01-24 (×2): 2 mg via INTRAVENOUS
  Filled 2014-01-23: qty 1

## 2014-01-23 MED ORDER — SODIUM CHLORIDE 0.9 % IR SOLN
Status: DC | PRN
  Administered 2014-01-23: 1000 mL

## 2014-01-23 MED ORDER — BISACODYL 10 MG RE SUPP: 10.0000 mg | Freq: Every day | RECTAL | Status: DC

## 2014-01-23 MED ORDER — PROTAMINE SULFATE 10 MG/ML IV SOLN
INTRAVENOUS | Status: AC
  Filled 2014-01-23: qty 50

## 2014-01-23 MED ORDER — BISACODYL 5 MG PO TBEC
10.0000 mg | DELAYED_RELEASE_TABLET | Freq: Every day | ORAL | Status: DC
  Administered 2014-01-25 – 2014-01-26 (×2): 10 mg via ORAL
  Filled 2014-01-23 (×2): qty 2

## 2014-01-23 MED ORDER — CHLORHEXIDINE GLUCONATE CLOTH 2 % EX PADS
6.0000 | MEDICATED_PAD | Freq: Every day | CUTANEOUS | Status: AC
  Administered 2014-01-24 – 2014-01-28 (×5): 6 via TOPICAL

## 2014-01-23 MED ORDER — SODIUM CHLORIDE 0.9 % IV SOLN
200.0000 ug | INTRAVENOUS | Status: DC | PRN
  Administered 2014-01-23: 0.2 ug/kg/h via INTRAVENOUS

## 2014-01-23 MED ORDER — MUPIROCIN 2 % EX OINT
1.0000 "application " | TOPICAL_OINTMENT | Freq: Two times a day (BID) | CUTANEOUS | Status: AC
  Administered 2014-01-24 – 2014-01-28 (×10): 1 via NASAL
  Filled 2014-01-23: qty 22

## 2014-01-23 MED ORDER — ROCURONIUM BROMIDE 50 MG/5ML IV SOLN
INTRAVENOUS | Status: AC
  Filled 2014-01-23: qty 1

## 2014-01-23 MED ORDER — SODIUM CHLORIDE 0.9 % IV SOLN
INTRAVENOUS | Status: AC
  Filled 2014-01-23 (×2): qty 2.5

## 2014-01-23 MED ORDER — SODIUM BICARBONATE 8.4 % IV SOLN
INTRAVENOUS | Status: AC
  Filled 2014-01-23: qty 50

## 2014-01-23 MED ORDER — HEPARIN SODIUM (PORCINE) 1000 UNIT/ML IJ SOLN
INTRAMUSCULAR | Status: DC | PRN
  Administered 2014-01-23: 10 mL via INTRAVENOUS
  Administered 2014-01-23: 4 mL via INTRAVENOUS
  Administered 2014-01-23: 6 mL via INTRAVENOUS
  Administered 2014-01-23 (×2): 5 mL via INTRAVENOUS

## 2014-01-23 MED ORDER — CETYLPYRIDINIUM CHLORIDE 0.05 % MT LIQD
7.0000 mL | Freq: Four times a day (QID) | OROMUCOSAL | Status: DC
  Administered 2014-01-24 (×4): 7 mL via OROMUCOSAL

## 2014-01-23 MED ORDER — DOPAMINE-DEXTROSE 3.2-5 MG/ML-% IV SOLN
0.0000 ug/kg/min | INTRAVENOUS | Status: DC
  Administered 2014-01-25: 3 ug/kg/min via INTRAVENOUS
  Filled 2014-01-23: qty 250

## 2014-01-23 MED ORDER — CHLORHEXIDINE GLUCONATE 0.12 % MT SOLN
15.0000 mL | Freq: Two times a day (BID) | OROMUCOSAL | Status: DC
  Administered 2014-01-23 – 2014-01-24 (×2): 15 mL via OROMUCOSAL
  Filled 2014-01-23 (×2): qty 15

## 2014-01-23 MED ORDER — POTASSIUM CHLORIDE 10 MEQ/50ML IV SOLN
10.0000 meq | INTRAVENOUS | Status: DC
  Administered 2014-01-23: 10 meq via INTRAVENOUS

## 2014-01-23 MED ORDER — ROCURONIUM BROMIDE 100 MG/10ML IV SOLN
INTRAVENOUS | Status: DC | PRN
  Administered 2014-01-23: 50 mg via INTRAVENOUS
  Administered 2014-01-23: 40 mg via INTRAVENOUS
  Administered 2014-01-23: 20 mg via INTRAVENOUS
  Administered 2014-01-23 (×2): 50 mg via INTRAVENOUS
  Administered 2014-01-23: 30 mg via INTRAVENOUS
  Administered 2014-01-23: 60 mg via INTRAVENOUS

## 2014-01-23 MED ORDER — ARTIFICIAL TEARS OP OINT
TOPICAL_OINTMENT | OPHTHALMIC | Status: DC | PRN
  Administered 2014-01-23: 1 via OPHTHALMIC

## 2014-01-23 MED ORDER — SODIUM CHLORIDE 0.9 % IJ SOLN
10.0000 mL | Freq: Two times a day (BID) | INTRAMUSCULAR | Status: DC
  Administered 2014-01-24 – 2014-01-26 (×5): 10 mL

## 2014-01-23 MED ORDER — DEXMEDETOMIDINE HCL IN NACL 200 MCG/50ML IV SOLN
0.0000 ug/kg/h | INTRAVENOUS | Status: DC
  Administered 2014-01-23: 0.7 ug/kg/h via INTRAVENOUS
  Filled 2014-01-23 (×3): qty 50

## 2014-01-23 MED ORDER — SODIUM CHLORIDE 0.9 % IJ SOLN: 10.0000 mL | INTRAMUSCULAR | Status: DC | PRN

## 2014-01-23 MED ORDER — LEVALBUTEROL HCL 0.63 MG/3ML IN NEBU: 0.6300 mg | INHALATION_SOLUTION | Freq: Three times a day (TID) | RESPIRATORY_TRACT | Status: DC | PRN

## 2014-01-23 MED ORDER — ALBUMIN HUMAN 5 % IV SOLN: 250.0000 mL | INTRAVENOUS | Status: AC | PRN

## 2014-01-23 MED ORDER — DOPAMINE-DEXTROSE 3.2-5 MG/ML-% IV SOLN
INTRAVENOUS | Status: DC | PRN
Start: 2014-01-23 — End: 2014-01-24
  Administered 2014-01-23: 3 ug/kg/min via INTRAVENOUS

## 2014-01-23 MED ORDER — SODIUM BICARBONATE 8.4 % IV SOLN
INTRAVENOUS | Status: DC | PRN
  Administered 2014-01-23 (×2): 50 meq via INTRAVENOUS

## 2014-01-23 MED ORDER — HEPARIN SODIUM (PORCINE) 1000 UNIT/ML IJ SOLN
INTRAMUSCULAR | Status: AC
  Filled 2014-01-23: qty 1

## 2014-01-23 MED ORDER — OXYCODONE HCL 5 MG PO TABS
5.0000 mg | ORAL_TABLET | ORAL | Status: DC | PRN
  Administered 2014-01-24 – 2014-01-26 (×6): 10 mg via ORAL
  Filled 2014-01-23 (×6): qty 2

## 2014-01-23 MED ORDER — SODIUM CHLORIDE 0.9 % IV SOLN: 250.0000 mL | INTRAVENOUS | Status: DC

## 2014-01-23 MED ORDER — NITROGLYCERIN IN D5W 200-5 MCG/ML-% IV SOLN
0.0000 ug/min | INTRAVENOUS | Status: DC
  Administered 2014-01-25: 30 ug/min via INTRAVENOUS
  Filled 2014-01-23: qty 250

## 2014-01-23 MED ORDER — PHENYLEPHRINE HCL 10 MG/ML IJ SOLN
10.0000 mg | INTRAVENOUS | Status: DC | PRN
  Administered 2014-01-23: 10 ug/min via INTRAVENOUS

## 2014-01-23 MED ORDER — ACETAMINOPHEN 500 MG PO TABS
1000.0000 mg | ORAL_TABLET | Freq: Four times a day (QID) | ORAL | Status: DC
  Administered 2014-01-24 – 2014-01-26 (×8): 1000 mg via ORAL
  Filled 2014-01-23 (×12): qty 2

## 2014-01-23 MED ORDER — HEMOSTATIC AGENTS (NO CHARGE) OPTIME
TOPICAL | Status: DC | PRN
  Administered 2014-01-23: 1 via TOPICAL

## 2014-01-23 MED ORDER — METOPROLOL TARTRATE 12.5 MG HALF TABLET
12.5000 mg | ORAL_TABLET | Freq: Two times a day (BID) | ORAL | Status: DC
  Administered 2014-01-24 – 2014-01-25 (×2): 12.5 mg via ORAL
  Filled 2014-01-23 (×5): qty 1

## 2014-01-23 MED ORDER — SODIUM CHLORIDE 0.9 % IV SOLN
250.0000 [IU] | INTRAVENOUS | Status: DC | PRN
  Administered 2014-01-23: 1 [IU]/h via INTRAVENOUS

## 2014-01-23 MED ORDER — LACTATED RINGERS IV SOLN
INTRAVENOUS | Status: DC | PRN
  Administered 2014-01-23 (×2): via INTRAVENOUS

## 2014-01-23 MED ORDER — SODIUM CHLORIDE 0.9 % IJ SOLN
3.0000 mL | Freq: Two times a day (BID) | INTRAMUSCULAR | Status: DC
  Administered 2014-01-24 (×2): 3 mL via INTRAVENOUS
  Administered 2014-01-25: 10 mL via INTRAVENOUS
  Administered 2014-01-25: 3 mL via INTRAVENOUS

## 2014-01-23 MED ORDER — LACTATED RINGERS IV SOLN: INTRAVENOUS | Status: DC

## 2014-01-23 SURGICAL SUPPLY — 144 items
ADAPTER CARDIO PERF ANTE/RETRO (ADAPTER) ×4 IMPLANT
ADH SRG 12 PREFL SYR 3 SPRDR (MISCELLANEOUS)
ADPR PRFSN 84XANTGRD RTRGD (ADAPTER) ×2
APL SRG 7X2 LUM MLBL SLNT (VASCULAR PRODUCTS) ×14
APPLICATOR COTTON TIP 6IN STRL (MISCELLANEOUS) ×2 IMPLANT
APPLICATOR TIP COSEAL (VASCULAR PRODUCTS) ×14 IMPLANT
ATTRACTOMAT 16X20 MAGNETIC DRP (DRAPES) ×4 IMPLANT
BAG DECANTER FOR FLEXI CONT (MISCELLANEOUS) ×6 IMPLANT
BANDAGE HEMOSTAT MRDH 4X4 STRL (MISCELLANEOUS) IMPLANT
BLADE CORE FAN STRYKER (BLADE) ×6 IMPLANT
BLADE STERNUM SYSTEM 6 (BLADE) ×2 IMPLANT
BLADE SURG 12 STRL SS (BLADE) ×4 IMPLANT
BLADE SURG 15 STRL LF DISP TIS (BLADE) ×2 IMPLANT
BLADE SURG 15 STRL SS (BLADE) ×24
BNDG HEMOSTAT MRDH 4X4 STRL (MISCELLANEOUS) ×4
CANISTER SUCTION 2500CC (MISCELLANEOUS) ×4 IMPLANT
CANNULA AORTIC ROOT 9FR (CANNULA) ×4 IMPLANT
CANNULA EZ GLIDE AORTIC 21FR (CANNULA) ×2 IMPLANT
CANNULA GRAFT 8MMX50CM (Graft) ×2 IMPLANT
CANNULA GUNDRY RCSP 15FR (MISCELLANEOUS) ×4 IMPLANT
CANNULA MALLEABLE SINGLE 40FR (CANNULA) ×2 IMPLANT
CANNULA VENOUS LOW PROF 34X46 (CANNULA) ×4 IMPLANT
CATH CPB KIT VANTRIGT (MISCELLANEOUS) ×4 IMPLANT
CATH HEART VENT LEFT (CATHETERS) ×2 IMPLANT
CATH RETROPLEGIA CORONARY 14FR (CATHETERS) IMPLANT
CATH ROBINSON RED A/P 18FR (CATHETERS) ×12 IMPLANT
CATH THORACIC 36FR RT ANG (CATHETERS) ×6 IMPLANT
CATH THORACIC 40FR (CATHETERS) ×2 IMPLANT
CATH/SQUID NICHOLS JEHLE COR (CATHETERS) ×2 IMPLANT
CAUTERY EYE LOW TEMP 1300F FIN (OPHTHALMIC RELATED) ×4 IMPLANT
CAUTERY SURG HI TEMP FINE TIP (MISCELLANEOUS) ×2 IMPLANT
CLIP FOGARTY SPRING 6M (CLIP) IMPLANT
CONT SPECI 4OZ STER CLIK (MISCELLANEOUS) ×4 IMPLANT
COVER MAYO STAND STRL (DRAPES) ×2 IMPLANT
COVER SURGICAL LIGHT HANDLE (MISCELLANEOUS) ×8 IMPLANT
CRADLE DONUT ADULT HEAD (MISCELLANEOUS) ×4 IMPLANT
DRAIN CHANNEL 32F RND 10.7 FF (WOUND CARE) ×4 IMPLANT
DRAPE CARDIOVASCULAR INCISE (DRAPES) ×4
DRAPE SLUSH/WARMER DISC (DRAPES) ×4 IMPLANT
DRAPE SRG 135X102X78XABS (DRAPES) ×2 IMPLANT
DRSG AQUACEL AG ADV 3.5X14 (GAUZE/BANDAGES/DRESSINGS) ×8 IMPLANT
ELECT BLADE 4.0 EZ CLEAN MEGAD (MISCELLANEOUS) ×4
ELECT BLADE 6.5 EXT (BLADE) ×4 IMPLANT
ELECT CAUTERY BLADE 6.4 (BLADE) ×4 IMPLANT
ELECT REM PT RETURN 9FT ADLT (ELECTROSURGICAL) ×8
ELECTRODE BLDE 4.0 EZ CLN MEGD (MISCELLANEOUS) ×2 IMPLANT
ELECTRODE REM PT RTRN 9FT ADLT (ELECTROSURGICAL) ×4 IMPLANT
GAUZE SPONGE 4X4 12PLY STRL (GAUZE/BANDAGES/DRESSINGS) ×8 IMPLANT
GLOVE BIO SURGEON STRL SZ 6 (GLOVE) ×10 IMPLANT
GLOVE BIO SURGEON STRL SZ 6.5 (GLOVE) ×2 IMPLANT
GLOVE BIO SURGEON STRL SZ7.5 (GLOVE) ×8 IMPLANT
GLOVE BIO SURGEONS STRL SZ 6.5 (GLOVE) ×2
GLOVE BIOGEL PI IND STRL 6 (GLOVE) IMPLANT
GLOVE BIOGEL PI IND STRL 6.5 (GLOVE) IMPLANT
GLOVE BIOGEL PI IND STRL 8 (GLOVE) IMPLANT
GLOVE BIOGEL PI INDICATOR 6 (GLOVE) ×6
GLOVE BIOGEL PI INDICATOR 6.5 (GLOVE) ×10
GLOVE BIOGEL PI INDICATOR 8 (GLOVE) ×2
GOWN STRL REUS W/ TWL LRG LVL3 (GOWN DISPOSABLE) ×8 IMPLANT
GOWN STRL REUS W/TWL LRG LVL3 (GOWN DISPOSABLE) ×16
HANDLE STAPLE ENDO GIA SHORT (STAPLE) ×2
HEMOSTAT POWDER SURGIFOAM 1G (HEMOSTASIS) ×18 IMPLANT
HEMOSTAT SURGICEL 2X14 (HEMOSTASIS) ×4 IMPLANT
INSERT FOGARTY XLG (MISCELLANEOUS) IMPLANT
KIT BASIN OR (CUSTOM PROCEDURE TRAY) ×4 IMPLANT
KIT ROOM TURNOVER OR (KITS) ×4 IMPLANT
KIT SUCTION CATH 14FR (SUCTIONS) ×4 IMPLANT
LEAD PACING MYOCARDI (MISCELLANEOUS) ×4 IMPLANT
LINE VENT (MISCELLANEOUS) ×2 IMPLANT
LOOP VESSEL SUPERMAXI WHITE (MISCELLANEOUS) ×2 IMPLANT
MARKER GRAFT CORONARY BYPASS (MISCELLANEOUS) IMPLANT
MATRIX HEMOSTAT SURGIFLO (HEMOSTASIS) ×2 IMPLANT
NS IRRIG 1000ML POUR BTL (IV SOLUTION) ×24 IMPLANT
PACK OPEN HEART (CUSTOM PROCEDURE TRAY) ×4 IMPLANT
PAD ARMBOARD 7.5X6 YLW CONV (MISCELLANEOUS) ×8 IMPLANT
PENCIL BUTTON HOLSTER BLD 10FT (ELECTRODE) IMPLANT
PUMP BLOOD CENTRIMAG (PUMP) ×2 IMPLANT
RELOAD GIA 30 2.0 ROTI (ENDOMECHANICALS) ×2 IMPLANT
SEALANT SURG COSEAL 4ML (VASCULAR PRODUCTS) ×2 IMPLANT
SEALANT SURG COSEAL 8ML (VASCULAR PRODUCTS) ×4 IMPLANT
SET CARDIOPLEGIA MPS 5001102 (MISCELLANEOUS) ×2 IMPLANT
SPONGE GAUZE 4X4 12PLY STER LF (GAUZE/BANDAGES/DRESSINGS) ×2 IMPLANT
SPONGE LAP 18X18 X RAY DECT (DISPOSABLE) ×4 IMPLANT
SPONGE LAP 4X18 X RAY DECT (DISPOSABLE) ×2 IMPLANT
STAPLER ENDO GIA 12 SHRT THIN (STAPLE) IMPLANT
STAPLER ENDO GIA 12MM SHORT (STAPLE) ×2 IMPLANT
STAPLER SKIN PROX WIDE 3.9 (STAPLE) ×2 IMPLANT
STAPLER VISISTAT 35W (STAPLE) ×2 IMPLANT
STOPCOCK 4 WAY LG BORE MALE ST (IV SETS) ×2 IMPLANT
SURGIFLO W/THROMBIN 8M KIT (HEMOSTASIS) ×4 IMPLANT
SUT BONE WAX W31G (SUTURE) ×4 IMPLANT
SUT ETHIBON 2 0 V 52N 30 (SUTURE) ×8 IMPLANT
SUT ETHIBON EXCEL 2-0 V-5 (SUTURE) ×4 IMPLANT
SUT ETHIBOND 2 0 SH (SUTURE) ×28 IMPLANT
SUT ETHIBOND 2 0 SH 36X2 (SUTURE) ×2 IMPLANT
SUT ETHIBOND 2 0 V4 (SUTURE) IMPLANT
SUT ETHIBOND 2 0V4 GREEN (SUTURE) IMPLANT
SUT ETHIBOND 4 0 RB 1 (SUTURE) IMPLANT
SUT ETHIBOND V-5 VALVE (SUTURE) ×4 IMPLANT
SUT PROLENE 3 0 RB 1 (SUTURE) ×4 IMPLANT
SUT PROLENE 3 0 SH 1 (SUTURE) IMPLANT
SUT PROLENE 3 0 SH DA (SUTURE) ×4 IMPLANT
SUT PROLENE 4 0 RB 1 (SUTURE) ×100
SUT PROLENE 4 0 SH DA (SUTURE) ×30 IMPLANT
SUT PROLENE 4-0 RB1 .5 CRCL 36 (SUTURE) ×6 IMPLANT
SUT PROLENE 5 0 C 1 36 (SUTURE) ×20 IMPLANT
SUT PROLENE 6 0 C 1 30 (SUTURE) ×44 IMPLANT
SUT PROLENE 6 0 CC (SUTURE) ×24 IMPLANT
SUT PROLENE BLUE 7 0 (SUTURE) ×2 IMPLANT
SUT SILK  1 MH (SUTURE) ×6
SUT SILK 1 MH (SUTURE) ×4 IMPLANT
SUT SILK 1 TIES 10X30 (SUTURE) ×4 IMPLANT
SUT SILK 2 0 (SUTURE) ×4
SUT SILK 2 0 SH CR/8 (SUTURE) ×10 IMPLANT
SUT SILK 2 0SH CR/8 30 (SUTURE) ×2 IMPLANT
SUT SILK 2-0 18XBRD TIE 12 (SUTURE) ×2 IMPLANT
SUT SILK 3 0 SH CR/8 (SUTURE) ×6 IMPLANT
SUT SILK 4 0 (SUTURE) ×4
SUT SILK 4-0 18XBRD TIE 12 (SUTURE) ×2 IMPLANT
SUT STEEL 6MS V (SUTURE) ×8 IMPLANT
SUT STEEL SZ 6 DBL 3X14 BALL (SUTURE) ×4 IMPLANT
SUT TEM PAC WIRE 2 0 SH (SUTURE) ×20 IMPLANT
SUT VIC AB 1 CTX 18 (SUTURE) ×2 IMPLANT
SUT VIC AB 1 CTX 36 (SUTURE) ×8
SUT VIC AB 1 CTX36XBRD ANBCTR (SUTURE) ×4 IMPLANT
SUT VIC AB 2-0 CTX 27 (SUTURE) ×8 IMPLANT
SUT VIC AB 3-0 X1 27 (SUTURE) ×6 IMPLANT
SYR 10ML KIT SKIN ADHESIVE (MISCELLANEOUS) IMPLANT
SYSTEM SAHARA CHEST DRAIN ATS (WOUND CARE) ×4 IMPLANT
TAPE CLOTH SURG 4X10 WHT LF (GAUZE/BANDAGES/DRESSINGS) ×2 IMPLANT
TAPE PAPER 2X10 WHT MICROPORE (GAUZE/BANDAGES/DRESSINGS) ×2 IMPLANT
TOWEL OR 17X24 6PK STRL BLUE (TOWEL DISPOSABLE) ×8 IMPLANT
TOWEL OR 17X26 10 PK STRL BLUE (TOWEL DISPOSABLE) ×8 IMPLANT
TRAY CATH LUMEN 1 20CM STRL (SET/KITS/TRAYS/PACK) ×2 IMPLANT
TRAY FOLEY IC TEMP SENS 14FR (CATHETERS) ×2 IMPLANT
TRAY FOLEY IC TEMP SENS 16FR (CATHETERS) ×4 IMPLANT
TUBING ART PRESS 48 MALE/FEM (TUBING) ×6 IMPLANT
TUBING BULK SUCTION (MISCELLANEOUS) ×2 IMPLANT
UNDERPAD 30X30 INCONTINENT (UNDERPADS AND DIAPERS) ×4 IMPLANT
VALVE AOR 12X23MECH LO POR (Prosthesis & Implant Heart) IMPLANT
VALVE AORTIC COND (Prosthesis & Implant Heart) ×4 IMPLANT
VENT LEFT HEART 12002 (CATHETERS) ×4
WATER STERILE IRR 1000ML POUR (IV SOLUTION) ×8 IMPLANT
YANKAUER SUCT BULB TIP NO VENT (SUCTIONS) ×2 IMPLANT

## 2014-01-23 NOTE — Anesthesia Postprocedure Evaluation (Signed)
  Anesthesia Post-op Note  Patient: [name] [name]  Procedure(s) Performed: Procedure(s): BENTALL PROCEDURE (N/A) REDO AORTIC VALVE REPLACEMENT (AVR) with 23 St. Jude Mechanical Aortic Valved Graft (N/A) REDO STERNOTOMY (N/A) INTRAOPERATIVE TRANSESOPHAGEAL ECHOCARDIOGRAM (N/A)  Patient Location: SICU  Anesthesia Type:General  Level of Consciousness: sedated  Airway and Oxygen Therapy: Patient remains intubated per anesthesia plan and Patient placed on Ventilator (see vital sign flow sheet for setting)  Post-op Pain: none  Post-op Assessment: Post-op Vital signs reviewed  Post-op Vital Signs: Reviewed  Last Vitals:  Filed Vitals:   01/23/14 1756  BP: 93/46  Pulse: 84  Temp:   Resp: 18    Complications: No apparent anesthesia complications

## 2014-01-23 NOTE — Progress Notes (Signed)
CT surgery p.m. Rounds  Status post redo Bentall procedure with mechanical valve-conduit for root aneurysm Patient hemodynamically stable Chest tube output not excessive Patient on inhaled nitric oxide for  postoperative RV dysfunction--we'll leave intubated and sedated overnight and wean nitric oxide first tomorrow prior to weaning ventilator

## 2014-01-23 NOTE — Anesthesia Preprocedure Evaluation (Addendum)
Anesthesia Evaluation  Patient identified by MRN, date of birth, ID band Patient awake    Reviewed: Allergy & Precautions, H&P , NPO status , Patient's Chart, lab work & pertinent test results, reviewed documented beta blocker date and time   History of Anesthesia Complications Negative for: history of anesthetic complications  Airway Mallampati: II TM Distance: >3 FB Neck ROM: Full    Dental  (+) Dental Advisory Given   Pulmonary neg pulmonary ROS, asthma , former smoker,  breath sounds clear to auscultation        Cardiovascular hypertension, + Peripheral Vascular Disease Rhythm:Regular Rate:Normal     Neuro/Psych    GI/Hepatic   Endo/Other    Renal/GU      Musculoskeletal   Abdominal   Peds  Hematology   Anesthesia Other Findings   Reproductive/Obstetrics                          Anesthesia Physical Anesthesia Plan  ASA: III  Anesthesia Plan: General   Post-op Pain Management:    Induction: Intravenous  Airway Management Planned: Oral ETT  Additional Equipment: Arterial line, PA Cath, TEE and Ultrasound Guidance Line Placement  Intra-op Plan:   Post-operative Plan: Post-operative intubation/ventilation  Informed Consent: I have reviewed the patients History and Physical, chart, labs and discussed the procedure including the risks, benefits and alternatives for the proposed anesthesia with the patient or authorized representative who has indicated his/her understanding and acceptance.   Dental advisory given  Plan Discussed with: CRNA and Surgeon  Anesthesia Plan Comments:         Anesthesia Quick Evaluation

## 2014-01-23 NOTE — Plan of Care (Signed)
Problem: Phase II - Intermediate Post-Op Goal: Wean to Extubate Outcome: Not Progressing On Nitric and came back with pO2 of 55. FiO2 increased to 80%. Will wean FiO2 only tonight. To remain on sedation. Goal: Maintain Hemodynamic Stability Outcome: Progressing CI>3.0 on Epinephrine, dopamine and milrinone drips.Orders to not wean these drips tonight. Phenylephrine drip to maintain sbp>90. May wean phenylephrine. Goal: CBGs/Blood Glucose per SCIP Criteria Outcome: Progressing On insulin drip and blood glucose is less than 180.

## 2014-01-23 NOTE — Brief Op Note (Addendum)
01/23/2014  2:16 PM  PATIENT:  [name] [name]  58 y.o. male  PRE-OPERATIVE DIAGNOSIS:  1. S/p AVR (St. Jude Mechanical Valve) for bicuspid valve  2. Ascending Thoracic Aortic Root Aneurysm   POST-OPERATIVE DIAGNOSIS:  1. S/p AVR (St. Jude Mechanical Valve) for bicuspid valve  2. Ascending Thoracic Aortic Root Aneurysm   PROCEDURE:  INTRAOPERATIVE TRANSESOPHAGEAL ECHOCARDIOGRAM, REDO STERNOTOMY for BENTALL PROCEDURE with AORTIC VALVE REPLACEMENT (AVR)  (using a St. Jude Valve conduit, valve size 23 mm)  SURGEON:  Surgeon(s) and Role:    * Kerin PernaPeter Van Trigt, MD - Primary  PHYSICIAN ASSISTANT: Doree Fudgeonielle Aldo Sondgeroth PA-C  ASSISTANTS: Bartholome Billhasity Yates RNFA   ANESTHESIA:   general  EBL:  Total I/O In: 2000 [I.V.:2000] Out: 2690 [Urine:2690]  BLOOD ADMINISTERED:Two FFP and One PLTS  DRAINS: Chest tubes in the mediastinal and pleural spaces   SPECIMEN:  Source of Specimen:  St Jude Mechanical Valve;ATA root aneursym  DISPOSITION OF SPECIMEN:  PATHOLOGY  COUNTS CORRECT:  YES  DICTATION: .Dragon Dictation  PLAN OF CARE: Admit to inpatient   PATIENT DISPOSITION:  ICU - intubated and hemodynamically stable.   Delay start of Pharmacological VTE agent (>24hrs) due to surgical blood loss or risk of bleeding: yes  BASELINE WEIGHT: 88 kg  Aortic Valve Etiology   Aortic Insufficiency:  Moderate  Aortic Valve Disease:  No.  Aortic Stenosis:  No.  Etiology (Choose at least one and up to  5 etiologies):  Degenerative - Calcified   Aortic Valve  Procedure Performed:  Replacement: Yes.  Mechanical Valve. Implant Model Number:[id number], Size:23 mm, Unique Device Identifier:[id number]  Repair/Reconstruction: Yes.  Replacement AV & major root reconstruction/debridement with valved conduit Mechanical Valve. Implant Model Number:[id number], Size:23 mm, Unique Device Identifier:[id number]  Aortic Annular Enlargement: Yes.

## 2014-01-23 NOTE — Progress Notes (Signed)
  Echocardiogram Echocardiogram Transesophageal has been performed.  [name] [name], [name] [name] 01/23/2014, 8:42 AM

## 2014-01-23 NOTE — Anesthesia Procedure Notes (Addendum)
Procedure Name: Intubation Date/Time: 01/23/2014 8:04 AM Performed by: Reine JustFLOWERS, Donell Sliwinski T Pre-anesthesia Checklist: Patient identified, Emergency Drugs available, Suction available, Patient being monitored and Timeout performed Patient Re-evaluated:Patient Re-evaluated prior to inductionOxygen Delivery Method: Circle system utilized and Simple face mask Preoxygenation: Pre-oxygenation with 100% oxygen Intubation Type: IV induction Ventilation: Mask ventilation without difficulty and Oral airway inserted - appropriate to patient size Laryngoscope Size: Hyacinth MeekerMiller and 3 Grade View: Grade I Tube type: Oral Tube size: 8.0 mm Number of attempts: 1 Airway Equipment and Method: Patient positioned with wedge pillow and Stylet Placement Confirmation: ETT inserted through vocal cords under direct vision,  positive ETCO2 and breath sounds checked- equal and bilateral Secured at: 23 cm Tube secured with: Tape Dental Injury: Teeth and Oropharynx as per pre-operative assessment     Procedures: Right IJ Theone MurdochSwan Ganz Catheter Insertion: [id number]: The patient was identified and consent obtained.  TO was performed, and full barrier precautions were used.  The skin was anesthetized with lidocaine-4cc plain with 25g needle.  Once the vein was located with the 22 ga. needle using ultrasound guidance , the wire was inserted into the vein.  The wire location was confirmed with ultrasound.  The tissue was dilated and the 8.5 [place] cordis catheter was carefully inserted. Afterwards Theone MurdochSwan Ganz catheter was inserted. PA catheter at 48cm.  The patient tolerated the procedure well.   CE

## 2014-01-23 NOTE — Transfer of Care (Signed)
Immediate Anesthesia Transfer of Care Note  Patient: [name] [name]  Procedure(s) Performed: Procedure(s): BENTALL PROCEDURE (N/A) REDO AORTIC VALVE REPLACEMENT (AVR) with 23 St. Jude Mechanical Aortic Valved Graft (N/A) REDO STERNOTOMY (N/A) INTRAOPERATIVE TRANSESOPHAGEAL ECHOCARDIOGRAM (N/A)  Patient Location: PACU and SICU  Anesthesia Type:General  Level of Consciousness: Patient remains intubated per anesthesia plan  Airway & Oxygen Therapy: Patient remains intubated per anesthesia plan and Patient placed on Ventilator (see vital sign flow sheet for setting)  Post-op Assessment: Report given to PACU RN and Post -op Vital signs reviewed and stable  Post vital signs: Reviewed and stable  Complications: No apparent anesthesia complications

## 2014-01-23 NOTE — Progress Notes (Signed)
The patient was examined and preop studies reviewed. There has been no change from the prior exam and the patient is ready for surgery.  plan redo sternotomy and resection of ascending aortic aneurysm, possible root replacement On T Yan today

## 2014-01-24 ENCOUNTER — Ambulatory Visit: Payer: BC Managed Care – PPO | Admitting: Nurse Practitioner

## 2014-01-24 ENCOUNTER — Inpatient Hospital Stay (HOSPITAL_COMMUNITY): Payer: BC Managed Care – PPO

## 2014-01-24 LAB — HEPATIC FUNCTION PANEL
ALT: 21 U/L (ref 0–53)
AST: 64 U/L — ABNORMAL HIGH (ref 0–37)
Albumin: 2.9 g/dL — ABNORMAL LOW (ref 3.5–5.2)
Alkaline Phosphatase: 43 U/L (ref 39–117)
Bilirubin, Direct: <0.2 mg/dL (ref 0.0–0.3)
Total Bilirubin: 0.3 mg/dL (ref 0.3–1.2)
Total Protein: 5.1 g/dL — ABNORMAL LOW (ref 6.0–8.3)

## 2014-01-24 LAB — CBC
HCT: 32.1 % — ABNORMAL LOW (ref 39.0–52.0)
HCT: 33.8 % — ABNORMAL LOW (ref 39.0–52.0)
HEMATOCRIT: 33.9 % — AB (ref 39.0–52.0)
HEMOGLOBIN: 11.6 g/dL — AB (ref 13.0–17.0)
Hemoglobin: 10.9 g/dL — ABNORMAL LOW (ref 13.0–17.0)
Hemoglobin: 11.7 g/dL — ABNORMAL LOW (ref 13.0–17.0)
MCH: 27.9 pg (ref 26.0–34.0)
MCH: 28.6 pg (ref 26.0–34.0)
MCH: 28.7 pg (ref 26.0–34.0)
MCHC: 34 g/dL (ref 30.0–36.0)
MCHC: 34.2 g/dL (ref 30.0–36.0)
MCHC: 34.6 g/dL (ref 30.0–36.0)
MCV: 82.3 fL (ref 78.0–100.0)
MCV: 83 fL (ref 78.0–100.0)
MCV: 83.7 fL (ref 78.0–100.0)
PLATELETS: 104 10*3/uL — AB (ref 150–400)
Platelets: 115 10*3/uL — ABNORMAL LOW (ref 150–400)
Platelets: 120 10*3/uL — ABNORMAL LOW (ref 150–400)
RBC: 3.9 MIL/uL — AB (ref 4.22–5.81)
RBC: 4.05 MIL/uL — AB (ref 4.22–5.81)
RBC: 4.07 MIL/uL — ABNORMAL LOW (ref 4.22–5.81)
RDW: 15.5 % (ref 11.5–15.5)
RDW: 15.7 % — ABNORMAL HIGH (ref 11.5–15.5)
RDW: 15.8 % — ABNORMAL HIGH (ref 11.5–15.5)
WBC: 18 10*3/uL — AB (ref 4.0–10.5)
WBC: 18 10*3/uL — ABNORMAL HIGH (ref 4.0–10.5)
WBC: 20.5 10*3/uL — AB (ref 4.0–10.5)

## 2014-01-24 LAB — PREPARE FRESH FROZEN PLASMA
UNIT DIVISION: 0
Unit division: 0

## 2014-01-24 LAB — POCT I-STAT 3, ART BLOOD GAS (G3+)
Acid-base deficit: 1 mmol/L (ref 0.0–2.0)
Acid-base deficit: 6 mmol/L — ABNORMAL HIGH (ref 0.0–2.0)
Acid-base deficit: 7 mmol/L — ABNORMAL HIGH (ref 0.0–2.0)
Bicarbonate: 23.9 mEq/L (ref 20.0–24.0)
Bicarbonate: 19.1 mEq/L — ABNORMAL LOW (ref 20.0–24.0)
Bicarbonate: 18.6 mEq/L — ABNORMAL LOW (ref 20.0–24.0)
O2 Saturation: 95 %
O2 Saturation: 96 %
O2 Saturation: 93 %
Patient temperature: 36.7
Patient temperature: 36
Patient temperature: 36.2
TCO2: 25 mmol/L (ref 0–100)
TCO2: 20 mmol/L (ref 0–100)
TCO2: 20 mmol/L (ref 0–100)
pCO2 arterial: 39.8 mmHg (ref 35.0–45.0)
pCO2 arterial: 35.6 mmHg (ref 35.0–45.0)
pCO2 arterial: 35.2 mmHg (ref 35.0–45.0)
pH, Arterial: 7.385 (ref 7.350–7.450)
pH, Arterial: 7.332 — ABNORMAL LOW (ref 7.350–7.450)
pH, Arterial: 7.328 — ABNORMAL LOW (ref 7.350–7.450)
pO2, Arterial: 75 mmHg — ABNORMAL LOW (ref 80.0–100.0)
pO2, Arterial: 84 mmHg (ref 80.0–100.0)
pO2, Arterial: 70 mmHg — ABNORMAL LOW (ref 80.0–100.0)

## 2014-01-24 LAB — PREPARE PLATELET PHERESIS: UNIT DIVISION: 0

## 2014-01-24 LAB — CREATININE, SERUM
CREATININE: 0.77 mg/dL (ref 0.50–1.35)
Creatinine, Ser: 0.78 mg/dL (ref 0.50–1.35)
GFR calc Af Amer: >90 mL/min (ref >90)
GFR calc non Af Amer: >90 mL/min (ref >90)

## 2014-01-24 LAB — BASIC METABOLIC PANEL
Anion gap: 13 (ref 5–15)
Anion gap: 22 — ABNORMAL HIGH (ref 5–15)
BUN: 17 mg/dL (ref 6–23)
BUN: 17 mg/dL (ref 6–23)
CHLORIDE: 103 meq/L (ref 96–112)
CO2: 24 meq/L (ref 19–32)
CO2: 18 mEq/L — ABNORMAL LOW (ref 19–32)
Calcium: 8 mg/dL — ABNORMAL LOW (ref 8.4–10.5)
Calcium: 8.2 mg/dL — ABNORMAL LOW (ref 8.4–10.5)
Chloride: 98 mEq/L (ref 96–112)
Creatinine, Ser: 0.78 mg/dL (ref 0.50–1.35)
Creatinine, Ser: 0.75 mg/dL (ref 0.50–1.35)
GFR calc Af Amer: >90 mL/min (ref >90)
GFR calc Af Amer: >90 mL/min (ref >90)
GFR calc non Af Amer: >90 mL/min (ref >90)
GFR calc non Af Amer: >90 mL/min (ref >90)
Glucose, Bld: 112 mg/dL — ABNORMAL HIGH (ref 70–99)
Glucose, Bld: 186 mg/dL — ABNORMAL HIGH (ref 70–99)
Potassium: 4.3 mEq/L (ref 3.7–5.3)
Potassium: 3.4 mEq/L — ABNORMAL LOW (ref 3.7–5.3)
Sodium: 140 mEq/L (ref 137–147)
Sodium: 138 mEq/L (ref 137–147)

## 2014-01-24 LAB — PREPARE CRYOPRECIPITATE
Unit division: 0
Unit division: 0

## 2014-01-24 LAB — GLUCOSE, CAPILLARY
Glucose-Capillary: 111 mg/dL — ABNORMAL HIGH (ref 70–99)
Glucose-Capillary: 134 mg/dL — ABNORMAL HIGH (ref 70–99)
Glucose-Capillary: 115 mg/dL — ABNORMAL HIGH (ref 70–99)
Glucose-Capillary: 107 mg/dL — ABNORMAL HIGH (ref 70–99)
Glucose-Capillary: 113 mg/dL — ABNORMAL HIGH (ref 70–99)
Glucose-Capillary: 105 mg/dL — ABNORMAL HIGH (ref 70–99)
Glucose-Capillary: 111 mg/dL — ABNORMAL HIGH (ref 70–99)
Glucose-Capillary: 109 mg/dL — ABNORMAL HIGH (ref 70–99)
Glucose-Capillary: 113 mg/dL — ABNORMAL HIGH (ref 70–99)
Glucose-Capillary: 103 mg/dL — ABNORMAL HIGH (ref 70–99)
Glucose-Capillary: 119 mg/dL — ABNORMAL HIGH (ref 70–99)
Glucose-Capillary: 147 mg/dL — ABNORMAL HIGH (ref 70–99)
Glucose-Capillary: 148 mg/dL — ABNORMAL HIGH (ref 70–99)
Glucose-Capillary: 151 mg/dL — ABNORMAL HIGH (ref 70–99)
Glucose-Capillary: 137 mg/dL — ABNORMAL HIGH (ref 70–99)
Glucose-Capillary: 160 mg/dL — ABNORMAL HIGH (ref 70–99)

## 2014-01-24 LAB — POCT I-STAT, CHEM 8
BUN: 14 mg/dL (ref 6–23)
Calcium, Ion: 1.11 mmol/L — ABNORMAL LOW (ref 1.12–1.23)
Chloride: 104 mEq/L (ref 96–112)
Creatinine, Ser: 0.7 mg/dL (ref 0.50–1.35)
Glucose, Bld: 101 mg/dL — ABNORMAL HIGH (ref 70–99)
HCT: 32 % — ABNORMAL LOW (ref 39.0–52.0)
Hemoglobin: 10.9 g/dL — ABNORMAL LOW (ref 13.0–17.0)
Potassium: 4.1 mEq/L (ref 3.7–5.3)
Sodium: 137 mEq/L (ref 137–147)
TCO2: 22 mmol/L (ref 0–100)

## 2014-01-24 LAB — MAGNESIUM
Magnesium: 2 mg/dL (ref 1.5–2.5)
Magnesium: 2.2 mg/dL (ref 1.5–2.5)
Magnesium: 2.5 mg/dL (ref 1.5–2.5)

## 2014-01-24 LAB — CARBOXYHEMOGLOBIN
Carboxyhemoglobin: 1.3 % (ref 0.5–1.5)
Methemoglobin: 1 % (ref 0.0–1.5)
O2 Saturation: 71.4 %
Total hemoglobin: 11.5 g/dL — ABNORMAL LOW (ref 13.5–18.0)

## 2014-01-24 MED ORDER — CHLORHEXIDINE GLUCONATE 0.12 % MT SOLN
15.0000 mL | Freq: Two times a day (BID) | OROMUCOSAL | Status: DC
  Administered 2014-01-25 – 2014-01-28 (×8): 15 mL via OROMUCOSAL
  Filled 2014-01-24 (×11): qty 15

## 2014-01-24 MED ORDER — FENTANYL CITRATE 0.05 MG/ML IJ SOLN
100.0000 ug | Freq: Once | INTRAMUSCULAR | Status: AC
  Administered 2014-01-24: 100 ug via INTRAVENOUS

## 2014-01-24 MED ORDER — FENTANYL CITRATE 0.05 MG/ML IJ SOLN
INTRAMUSCULAR | Status: AC
  Filled 2014-01-24: qty 2

## 2014-01-24 MED ORDER — FUROSEMIDE 10 MG/ML IJ SOLN
20.0000 mg | Freq: Two times a day (BID) | INTRAMUSCULAR | Status: DC
  Administered 2014-01-24 (×2): 20 mg via INTRAVENOUS
  Filled 2014-01-24 (×5): qty 2

## 2014-01-24 MED ORDER — DEXMEDETOMIDINE HCL IN NACL 200 MCG/50ML IV SOLN: 0.0000 ug/kg/h | INTRAVENOUS | Status: DC

## 2014-01-24 MED ORDER — SODIUM CHLORIDE 0.9 % IV SOLN
50.0000 ug/h | INTRAVENOUS | Status: DC
  Administered 2014-01-24: 50 ug/h via INTRAVENOUS
  Filled 2014-01-24: qty 50

## 2014-01-24 MED ORDER — CETYLPYRIDINIUM CHLORIDE 0.05 % MT LIQD
7.0000 mL | Freq: Two times a day (BID) | OROMUCOSAL | Status: DC
  Administered 2014-01-25 (×2): 7 mL via OROMUCOSAL

## 2014-01-24 MED ORDER — FUROSEMIDE 10 MG/ML IJ SOLN: 20.0000 mg | Freq: Once | INTRAMUSCULAR | Status: DC

## 2014-01-24 MED ORDER — SODIUM BICARBONATE 8.4 % IV SOLN
25.0000 meq | Freq: Once | INTRAVENOUS | Status: AC
  Administered 2014-01-24: 25 meq via INTRAVENOUS
  Filled 2014-01-24: qty 25

## 2014-01-24 MED ORDER — DEXMEDETOMIDINE HCL IN NACL 400 MCG/100ML IV SOLN
0.0000 ug/kg/h | INTRAVENOUS | Status: DC
  Administered 2014-01-24: 1 ug/kg/h via INTRAVENOUS
  Administered 2014-01-24: 0.7 ug/kg/h via INTRAVENOUS
  Filled 2014-01-24 (×4): qty 100

## 2014-01-24 MED ORDER — SODIUM BICARBONATE 8.4 % IV SOLN
50.0000 meq | Freq: Once | INTRAVENOUS | Status: AC
  Administered 2014-01-24: 50 meq via INTRAVENOUS

## 2014-01-24 MED FILL — Lidocaine HCl IV Inj 20 MG/ML: INTRAVENOUS | Qty: 5 | Status: AC

## 2014-01-24 MED FILL — Sodium Bicarbonate IV Soln 8.4%: INTRAVENOUS | Qty: 50 | Status: AC

## 2014-01-24 MED FILL — Dexmedetomidine HCl IV Soln 200 MCG/2ML: INTRAVENOUS | Qty: 2 | Status: AC

## 2014-01-24 MED FILL — Heparin Sodium (Porcine) Inj 1000 Unit/ML: INTRAMUSCULAR | Qty: 10 | Status: AC

## 2014-01-24 MED FILL — Sodium Chloride IV Soln 0.9%: INTRAVENOUS | Qty: 5000 | Status: AC

## 2014-01-24 MED FILL — Heparin Sodium (Porcine) Inj 1000 Unit/ML: INTRAMUSCULAR | Qty: 30 | Status: AC

## 2014-01-24 MED FILL — Mannitol IV Soln 20%: INTRAVENOUS | Qty: 500 | Status: AC

## 2014-01-24 MED FILL — Magnesium Sulfate Inj 50%: INTRAMUSCULAR | Qty: 10 | Status: AC

## 2014-01-24 MED FILL — Potassium Chloride Inj 2 mEq/ML: INTRAVENOUS | Qty: 40 | Status: AC

## 2014-01-24 MED FILL — Electrolyte-R (PH 7.4) Solution: INTRAVENOUS | Qty: 9000 | Status: AC

## 2014-01-24 NOTE — Progress Notes (Addendum)
[address]       [place] [address]             [phone]      1 Day Post-Op Procedure(s) (LRB): BENTALL PROCEDURE (N/A) REDO AORTIC VALVE REPLACEMENT (AVR) with 23 St. Jude Mechanical Aortic Valved Graft (N/A) REDO STERNOTOMY (N/A) INTRAOPERATIVE TRANSESOPHAGEAL ECHOCARDIOGRAM (N/A)  Subjective:  Patient agitated, uncomfortable overnight, remains on vent alert, follows commands this morning. Objective: Vital signs in last 24 hours: Temp:  [96.3 F (35.7 C)-99.5 F (37.5 C)] 97.2 F (36.2 C) (10/02 1015) Pulse Rate:  [73-90] 81 (10/02 1025) Cardiac Rhythm:  [-] Normal sinus rhythm (10/02 0800) Resp:  [9-30] 14 (10/02 1025) BP: (81-135)/(46-69) 118/52 mmHg (10/02 1025) SpO2:  [92 %-100 %] 98 % (10/02 1025) Arterial Line BP: (89-159)/(47-75) 104/50 mmHg (10/02 1015) FiO2 (%):  [50 %-80 %] 50 % (10/02 1025) Weight:  [192 lb 14.4 oz (87.5 kg)-213 lb 13.5 oz (97 kg)] 213 lb 13.5 oz (97 kg) (10/02 0500)  Hemodynamic parameters for last 24 hours: PAP: (20-33)/(12-24) 20/12 mmHg CVP:  [13 mmHg-16 mmHg] 16 mmHg CO:  [4.8 L/min-7.6 L/min] 7.6 L/min CI:  [2.3 L/min/m2-3.7 L/min/m2] 3.7 L/min/m2  Intake/Output from previous day: 10/01 0701 - 10/02 0700 In: 16004.1 [I.V.:11860.1; Blood:2769; NG/GT:60; IV Piggyback:1315] Out: 6190 [Urine:5490; Emesis/NG output:300; Chest Tube:400] Intake/Output this shift: Total I/O In: 501.9 [I.V.:451.9; IV Piggyback:50] Out: 325 [Urine:235; Chest Tube:90]  General appearance: alert, cooperative and no distress Heart: regular rate and rhythm Lungs: clear to auscultation bilaterally Abdomen: soft, non-tender; bowel sounds normal; no masses,  no organomegaly Extremities: edema trace Wound: clean and dry, aquacel on sternotomy  Lab Results:  Recent Labs  01/23/14 2330 01/23/14 2343 01/24/14 0445  WBC 18.0*  --  18.0*  HGB 11.7* 10.9* 11.6*  HCT 33.8* 32.0* 33.9*  PLT 120*  --  115*   BMET:  Recent Labs  01/23/14 2343 01/24/14 0445  NA 137 140  K 4.1 4.3  CL 104 103  CO2  --  24  GLUCOSE 101* 112*  BUN 14 17  CREATININE 0.70 0.78  CALCIUM  --  8.0*    PT/INR:  Recent Labs  01/23/14 1800  LABPROT 17.7*  INR 1.46   ABG    Component Value Date/Time   PHART 7.385 01/24/2014 0533   HCO3 23.9 01/24/2014 0533   TCO2 25 01/24/2014 0533   ACIDBASEDEF 1.0 01/24/2014 0533   O2SAT 95.0 01/24/2014 0533   CBG (last 3)   Recent Labs  01/24/14 0102 01/24/14 0204 01/24/14 0253  GLUCAP 107* 113* 105*    Assessment/Plan: S/P Procedure(s) (LRB): BENTALL PROCEDURE (N/A) REDO AORTIC VALVE REPLACEMENT (AVR) with 23 St. Jude Mechanical Aortic Valved Graft (N/A) REDO STERNOTOMY (N/A) INTRAOPERATIVE TRANSESOPHAGEAL ECHOCARDIOGRAM (N/A)  1. CV- maintaining NSR, wean Dopamine, Epi, Milrinone, Neo synephrine as tolerated 2. Pulm- currently weaning to extubate, CXR stable some mild atelectasis present 3. Renal- creatinine, lytes okay- + hypervolemia, Lasix ordered 4. Expected Post Operative Blood Loss Anemia- mild Hgb is 11.6 5. CBGs controlled, will wean insulin drip as tolerated 6. Dispo- patient stable, weaning from vent, wean drips as tolerated, leave chest tubes in place today   LOS: 1 day    BARRETT, ERIN 01/24/2014  Patient was successfully extubated after nitric oxide was weaned-this was used because of postop RV dysfunction  Hemodynamic stable neuro intact  Will remove PA catheter after epinephrine has been weaned off-continue low-dose milrinone and dopamine for RV support  Start Coumadin for mechanical aVR tomorrow

## 2014-01-24 NOTE — Progress Notes (Signed)
Pre extbubation blood gas called to Dr. Maren BeachVanTrigt at 1440. Given  1mp Sodium bicarbonate IV prior to extubation. Repeat blood gas at 1545 called and patient was given 1/2 amp sodium bicarbonate IV per order.

## 2014-01-24 NOTE — Progress Notes (Signed)
Radial aline ready 40 points greater than cuff BP.  R. Arm Cuff BP 126/59  L. Arm Cuff 129/62.  Will monitor BP by cuff.

## 2014-01-24 NOTE — Op Note (Signed)
NAMDaisy [name]:  [name], [name]               ACCOUNT NO.:  [id number]  MEDICAL RECORD NO.:  [id number]  LOCATION:  2S11C                        FACILITY:  [facility]  PHYSICIAN:  [name] [name], M.D.DATE OF BIRTH:  Jun 07, 1955  DATE OF PROCEDURE:  01/23/2014 DATE OF DISCHARGE:                              OPERATIVE REPORT   PROCEDURE:  Intraoperative transesophageal echocardiography.  INDICATION FOR PROCEDURE:  [name] is a 58 year old gentleman who presents today for a redo Bentall procedure or aortic valve replacement with conduit.  This is to be performed by Dr. Kathlee NationsPeter Van [name].  The patient had previously undergone many years ago a [name] [name] bivalvular mechanical prosthesis placement.  On arrival to the holding area and with local anesthesia with sedation, pulmonary artery and radial arterial lines were placed.  He was then taken to OR for routine induction of general anesthesia, after which, TEE probe was passed oropharyngeally into the stomach.  This was slightly withdrawn for imaging of the cardiac structures.  PRE-CARDIOPULMONARY BYPASS TEE EXAMINATION: 1. Left Ventricle:  Left ventricular chamber is seen initially in the     short axis view.  There is a mild left ventricular hypertrophy     appreciated concentrically and symmetrically.  Overall, systolic     function is apparently normal.  There are no regional wall motion     abnormalities appreciated.  Papillary muscles are well outlined. 2. Mitral Valve:  The mitral valve is seen initially in the four-     chamber view.  The mitral valve itself is mildly thickened with     both leaflets seen and well visualized.  Coaptation point is just     below the level of the annulus.  This appears to be normally     functional mitral valve apparatus.  On color Doppler, there is     trivial mitral regurgitant flow appreciated. 3. Aortic Valve:  Aortic valve is then seen in the short axis view.     Due to the hyperechogenicity around this  valve, not all the     structures were easily able to be seen.  There appeared to be a     mechanical bileaflet apparatus.  It is well seated.  There does     seem to be some mild restriction of one leaflet motion, however,     only 1 leaflet could easily be seen.  In the long axis view, there     is a turbulent jet across this valve.  It is indicated and is     normal for this type of valvular apparatus.  On further pullback     into the aortic root, the aortic root is dilated at the beginning     of the sino-tubular junction and beyond.  The sino-tubular junction     is probably 4.2 cm across.  If we go further up into the ascending     aortic root, it dilates further to the 4.5-4.6 cm diameter range.     On further pullback to the aortic valve itself, we are able to     demonstrate there is no aortic insufficiency appreciated at this     time. 4. Right  Ventricle:  The right ventricular chamber is seen.  It is     mildly enlarged.  It is contractile. 5. Tricuspid Valve:  Normal, thin, compliant, mobile tricuspid valve     is appreciated.  Color Doppler reveals some trivial regurgitant     flow across the valve at this time.  The interatrial septum is seen     above this level and shows to be lipomatous.  The interatrial     septum is intact.  Right atrium is a normal chamber.  The left     atrium is also interrogated and is only mildly mildly dilated in     appearance.  The appendage is seen, no masses are appreciated. The patient is placed on cardiopulmonary bypass.  The procedure is a aortic valve mechanical prosthesis placed in the aortic position with the aortic conduit above this.  The patient is then rewarmed and separated from cardiopulmonary bypass with the initial attempt.  POST CARDIOPULMONARY BYPASS TEE EXAMINATION: 1. Left Ventricle:  Left ventricular chamber seen initially in the     early bypass.  It is vigorous, contractile but low volume with both     papillary  muscles that are essentially touching one another during     in systole. 2. Mitral Valve:  Thin compliant mobile mitral valve.  No increase in     mitral regurgitant flow was appreciated. 3. Aortic Valve:  In the aortic position, we can now see the bileaflet     mechanical valve.  It is well seated, well placed in the aortic     position.  Both leaflets are mobile and leaflets opened     appropriately during systolic ejection.  On color Doppler across     this, there is no aortic insufficiency appreciated. 4. Tricuspid valve now shows just mild regurgitant flow. 5. Right ventricle in the early bypass.  There is dilation of the     right ventricular chamber.  The chamber itself is hypocontractile     in its post bypass.  It is enlarged and hypocontractile.  With the     addition of inotropes and time separate from cardiopulmonary     bypass, there is improvement in the right ventricular function     appreciated. Ultimately, the patient was returned to the Cardiac Intensive Care Unit in stable condition.          ______________________________ [name] Forts, M.D.     JTM/MEDQ  D:  01/24/2014  T:  01/24/2014  Job:  [id number]

## 2014-01-24 NOTE — Progress Notes (Signed)
Per PVT: okay to expedite NO wean. Decrease NO by 5ppm every until reaching 5ppm. Then, decrease by 1ppm every 30 minutes until at 0ppm. RT will continue to monitor.

## 2014-01-24 NOTE — Progress Notes (Signed)
Patient ID: [name] [name], male   DOB: 10/04/1955, 58 y.o.   MRN: [id number] EVENING ROUNDS NOTE :     [address]       [name] [id number]             [phone]                 1 Day Post-Op Procedure(s) (LRB): BENTALL PROCEDURE (N/A) REDO AORTIC VALVE REPLACEMENT (AVR) with 23 St. Jude Mechanical Aortic Valved Graft (N/A) REDO STERNOTOMY (N/A) INTRAOPERATIVE TRANSESOPHAGEAL ECHOCARDIOGRAM (N/A)  Total Length of Stay:  LOS: 1 day  BP 141/58  Pulse 96  Temp(Src) 98.4 F (36.9 C) (Core (Comment))  Resp 32  Ht 5\' 10"  (1.778 m)  Wt 213 lb 13.5 oz (97 kg)  BMI 30.68 kg/m2  SpO2 91%  .Intake/Output     10/01 0701 - 10/02 0700 10/02 0701 - 10/03 0700   P.O.  60   I.V. (mL/kg) 11860.1 (122.3) 1297.9 (13.4)   Blood 2769    NG/GT 60 100   IV Piggyback 1315 120   Total Intake(mL/kg) 16004.1 (165) 1577.9 (16.3)   Urine (mL/kg/hr) 5490 (2.4) 1330 (1.2)   Emesis/NG output 300 (0.1) 495 (0.4)   Chest Tube 400 (0.2) 270 (0.2)   Total Output 6190 2095   Net +9814.1 -517.1          . sodium chloride    . sodium chloride    . dexmedetomidine Stopped (01/24/14 1513)  . DOPamine 3 mcg/kg/min (01/24/14 1700)  . epinephrine 1 mcg/min (01/24/14 1800)  . fentaNYL infusion INTRAVENOUS 10 mcg/hr (01/24/14 1800)  . insulin (NOVOLIN-R) infusion 4.9 Units/hr (01/24/14 1800)  . lactated ringers    . milrinone 0.3 mcg/kg/min (01/24/14 1800)  . nitroGLYCERIN 15 mcg/min (01/24/14 1800)  . phenylephrine (NEO-SYNEPHRINE) Adult infusion Stopped (01/24/14 1524)     Lab Results  Component Value Date   WBC 20.5* 01/24/2014   HGB 10.9* 01/24/2014   HCT 32.1* 01/24/2014   PLT 104* 01/24/2014   GLUCOSE 186* 01/24/2014   CHOL 127 04/05/2013   TRIG 64.0 04/05/2013   HDL 50.90 04/05/2013   LDLDIRECT 181.4 11/16/2012   LDLCALC 63 04/05/2013   ALT 21 01/24/2014   AST 64* 01/24/2014   NA 138 01/24/2014   K 3.4* 01/24/2014   CL 98 01/24/2014   CREATININE 0.75 01/24/2014   BUN 17  01/24/2014   CO2 18* 01/24/2014   INR 1.46 01/23/2014   HGBA1C 6.1* 01/20/2014   Awake and alert  Extubated neuro intact Tubes and lines in Weaning drips  Delight OvensEdward B Brannen Koppen MD  Beeper [phone] Office [phone] 01/24/2014 6:32 PM

## 2014-01-24 NOTE — Progress Notes (Signed)
Patient awake without c/o pain.  Wiping his own forehead with wash cloth and writing notes despite increased doses of precedex and fentanyl.   Denies pain.  No further agitation.  Remains in SR with rare PVCs/PACS.

## 2014-01-24 NOTE — Progress Notes (Signed)
Patient remains agitated after sedation. Patient's chest appears to have increased pulsations in precordial area.  AAI pacing dc'd with cessation of activity and agitation.  Patient in SR.  Continue to monitor.  Pacemaker set on VVI with back up rate of 60.  Titrating fentanyl for comfort.

## 2014-01-24 NOTE — Op Note (Signed)
[name], [name] NO.:  [id number]  MEDICAL RECORD NO.:  [id number]  LOCATION:  2S11C                        FACILITY:  [facility]  PHYSICIAN:  Kerin Perna, M.D.  DATE OF BIRTH:  [birth year]  DATE OF PROCEDURE:  01/23/2014 DATE OF DISCHARGE:                              OPERATIVE REPORT   OPERATION: 1. Redo sternotomy, right axillary artery cannulation for arterial     inflow. 2. Redo AVR with total aortic root replacement using a mechanical     valve-conduit (St. Jude 23 mm aortic valve and 25 mm Hemashield     conduit, serial #[id number]). 3. Placement of femoral A-line for blood pressure monitoring.  SURGEON:  Kerin Perna, M.D.  ASSISTANT:  Doree Fudge, PA-C. and Beatrix Fetters,, RNFA.  PREOPERATIVE DIAGNOSES: 1. 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 2. Nonsignificant coronary artery disease.  POSTOPERATIVE DIAGNOSIS: 1. 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 2. Nonsignificant coronary artery disease.  CLINICAL NOTE:  The patient is a 58 year old gentleman with a remote history of mechanical AVR for endocarditis.  He has been followed by his cardiologist with serial echoes.  The most recent echo showed fairly good performance of his mechanical AVR with some slight gradient; however, it was noted the aortic root was significantly large to 5.2 cm. A CTA of the thoracic aorta was performed which confirmed an aortic root aneurysm.  He underwent cardiac catheterization which showed no significant coronary artery disease.  His echocardiogram showed a mechanical AVR to be functioning fairly well without any AI and with a small transvalvular gradient.  The patient's cardiologist recommended surgical evaluation for resection of the root aneurysm and I saw the patient in the office and reviewed results of his cardiac cath and echocardiograms.  I agreed  with the recommendation for aortic root replacement to reduce the risk of aortic dissection.  I discussed the procedure in detail with the patient including the fact that we would probably need to replace his aortic valve as part of the aortic root reconstruction.  It was the patient's wish that he have a mechanical valve placed again and he understood that meant a lifelong commitment to Coumadin anticoagulation which he had tolerated well over the past 25 years.  I discussed procedure in detail with the patient including the location of the surgical incisions and the chest and right axillary artery cannulation site, the use of general anesthesia and cardiopulmonary bypass, and expected postoperative hospital recovery.  I discussed with the patient the risks of the operation including risks of stroke, MI, bleeding, blood transfusion requirement, infection, postoperative pleural effusion, postoperative pacemaker requirement, and death.  After reviewing these issues, he demonstrated his understanding and agreed to proceed with the surgery under what I felt was an informed consent.  OPERATIVE FINDINGS: 1. Large root aneurysm with severely thinned out wall which was     adherent to the pulmonary artery and extended down to the aortic     anulus. 2. 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 3. Good functioning new  AVR with minimal gradient and no leak and     successful reconstruction of the aortic root with a valve-conduit.  DESCRIPTION OF PROCEDURE:  The patient was brought to the operating room and placed supine on the operating table.  General anesthesia was induced under invasive hemodynamic monitoring.  A transesophageal echo probe was placed by the anesthesiologist which confirmed the preoperative diagnosis.  A proper time-out was performed.  A small incision was made beneath the right clavicle and the pectoralis major and pectoralis minor were  divided.  A deep retractor was placed and the axillary artery was identified.  It was encircled with vessel loops and 4000 units of heparin was administered.  Vascular clamps were placed proximally distally and an arteriotomy was made.  The graft- inflow cannula catheter was then sewn end-to-side using running 5-0 Prolene and the clamps were removed and there was good hemostasis.  Attention was then directed to the redo sternotomy.  Skin is incision was made in the old scar and the wires were removed.  The sternum was divided with the oscillating saw with care being taken to avoid injury to the underlying vascular structures.  The anterior mediastinum was carefully and tediously dissected.  There were dense adhesions.  The ascending aorta was very aneurysmal and very thin.  It was stuck to the main pulmonary artery.  Once the anterior surface of the right ventricle, the right atrium, and the ascending aorta were dissected out, heparin was administered.  Pursestring was placed in the right atrium and the patient was cannulated.  The patient was then placed on cardiopulmonary bypass from the arterial inflow from the axillary artery and venous drainage from the right atrium.  The remainder of the dissection was carried out to carefully dissect the PA off the aorta. The innominate artery was dissected and a vessel loop was placed around that in case we needed antegrade cerebral perfusion for hypothermic circulatory arrest.  Cardioplegic cannulas were placed for both antegrade and retrograde blood cardioplegia.  The ascending aorta above the aneurysm was normal caliber and was felt that the reconstruction of the Bentall procedure could be done with the cross-clamp and not circulatory arrest.  The patient was then cooled to 28 degrees and the clamp was placed just beneath the innominate artery.  1.2 L of cold blood cardioplegia was delivered in split doses between the antegrade aortic and  retrograde coronary sinus catheters.  There was good cardioplegic arrest and septal temperature dropped less than 12 degrees.  Cardioplegia was delivered every 20 minutes or less.  The aorta was divided in the area of the aneurysm.  It was very thin. The sinus of Valsalva was involved around each leaflet.  Mechanical valve was inspected and found to have a small film of fibrinous material around the sewing ring, but overall the valve appeared to be intact. The coronary ostia were inspected.  The right coronary ostium was small to a nondominant vessel.  The left main coronary ostium was normal.  The decision was made to do a formal Bentall procedure as the aneurysm extended onto the aortic valve anulus.  Buttons were created around the left main and right coronary.  The aorta around the left main was adherent and stuck to the pulmonary artery and pulmonary artery adherent tissue was dissected off in order to obtain an adequate button for root reconstruction.  There was small tear of the pulmonary artery was repaired with the 4-0 pledgeted Prolene sutures.  The coronary buttons were then  freed up and retracted to the side.  This left the aortic valve and aortic anulus.  The mechanical valve was carefully and tediously excised from the anulus using a scalpel.  There was heavy calcification.  The valve was excised without injuring the anulus.  All of the suture material and all pledgets were removed as well.  The outflow tract was irrigated with copious amounts of cold saline.  The anulus was sized to a 23 mm St. Jude valve.  It was decided to use a 23 mm valve-conduit. A 2-0 Ethibond sutures were placed around the aortic anulus in the supra-annular position.  After these sutures were tied, they were passed through the sewing ring of the St. Jude valve conduit and the conduit was seated and the sutures were tied. There was good confirmation of the valve to the anulus.  There was a layer  of CoSeal was placed around the annulus.  Next, the coronary buttons were sewn to the side of the conduit.  First, the left main button was sewn to the posterior aspect and an opening was made using the I cautery of appropriate location and size.  The anastomosis was constructed using a running 5-0 Prolene.  The left main ostium was widely patent at the end of the anastomosis.  Next, the right coronary button was sewn to the anterior aspect of the graft using a small opening in the graft using the I cautery of appropriate size and location.  The right coronary button was sewn end- to-side with running 5-0 Prolene and this anastomosis was widely patent as well.  Cardioplegia was redosed.  A layer of CoSeal was placed around the suture line around each coronary button.  Another graft was measured to the appropriate length and cut to the appropriate bevel to perform the outflow anastomosis to the ascending aorta beneath the crossclamp. This was constructed with a running 4-0 Prolene around the posterior aspect of the anastomosis were reinforced with several interrupted 4-0 Prolene sutures for hemostasis.  The anastomosis was then completed around the circumference of the ascending aorta with continuation of the running 4-0 Prolene.  This was followed by several interrupted 4-0 Prolene pledgeted sutures around the anterior aspect of the anastomosis for hemostasis.  New vent was placed in the graft using a pledgeted pursestring and the patient was placed in deep Trendelenburg.  Air was vented from the coronaries with a dose of retrograde warm blood cardioplegia and using the usual de-airing maneuvers.  The heart was de-aired and the crossclamp was removed.  It should be noted that during the reconstruction of the aortic root, the operative field was insufflated with CO2.  The patient also had a LV vent placed at the onset of the procedure.  The patient was reperfused and rewarmed.  There  appeared to be good hemostasis in all the suture lines.  Temporary pacing wires were applied.  The lungs were re-expanded and ventilator was resumed.  The vents were removed and low-dose inotropes were started.  There appeared to be excellent cardiac function.  When the heart filled and pulsatile flow was resumed, there was a small bleeding point from the left coronary button which was repaired with a single figure-of-eight 5-0 Prolene.  When the heart was full, it was also apparent that there was some bleeding from the main pulmonary artery which was adherent to the aortic root in the area of the left coronary button.  Several pledgeted sutures of 4-0 Prolene were required to be placed  to control the pulmonary artery bleeding as the tissue was very thin and fragile and easily torn.  Once the PA bleeding was resolved, the patient weaned off cardiopulmonary bypass.  Hemodynamics were satisfactory.  There was some evidence of RV dysfunction with some RV dilatation and elevated CVP. Additional inotropes were added and this improved RV function.  The patient was carefully observed as he received protamine and then platelets for low platelet count as well as FFP for his preoperative Coumadin anticoagulation.  The patient remained stable.  The new valve was functioning well.  LV function was excellent.  The RV function showed mild dysfunction, but there was no significant TR.  Cardiac output was 6-7 L/minute.  The protamine was administered, platelets and FFP were given due to a post pump coagulopathy.  This improved with the clotting factors.  The superior pericardial fat was closed.  It should be noted that a left femoral A-line was placed for good blood pressure monitoring.  The sternum was closed with interrupted steel wire.  The patient showed no hemodynamic effect of the sternal closure.  Prior to sternal closure, anterior and posterior mediastinal chest tubes were placed as well as  a right pleural chest tube all brought out through separate incisions.  The pectoralis fascia was closed using running #1 Vicryl.  The subcutaneous and skin layers were closed in running Vicryl and sterile dressings were applied.  Total cardiopulmonary bypass time was 255 minutes.     Kerin Perna, M.D.     PV/MEDQ  D:  01/24/2014  T:  01/24/2014  Job:  [id number]

## 2014-01-24 NOTE — Progress Notes (Signed)
Increased restlessness, agitation.  Patient awake, agitated with increased peak inspiratory pressures/asynchronous with increased minutes ventilation.  Dr. Donata ClayVan Trigt notified of all parameters.  Sedation orders rec'd.

## 2014-01-25 ENCOUNTER — Inpatient Hospital Stay (HOSPITAL_COMMUNITY): Payer: BC Managed Care – PPO

## 2014-01-25 LAB — GLUCOSE, CAPILLARY
Glucose-Capillary: 103 mg/dL — ABNORMAL HIGH (ref 70–99)
Glucose-Capillary: 105 mg/dL — ABNORMAL HIGH (ref 70–99)
Glucose-Capillary: 107 mg/dL — ABNORMAL HIGH (ref 70–99)
Glucose-Capillary: 112 mg/dL — ABNORMAL HIGH (ref 70–99)
Glucose-Capillary: 116 mg/dL — ABNORMAL HIGH (ref 70–99)
Glucose-Capillary: 141 mg/dL — ABNORMAL HIGH (ref 70–99)
Glucose-Capillary: 155 mg/dL — ABNORMAL HIGH (ref 70–99)
Glucose-Capillary: 167 mg/dL — ABNORMAL HIGH (ref 70–99)
Glucose-Capillary: 176 mg/dL — ABNORMAL HIGH (ref 70–99)
Glucose-Capillary: 180 mg/dL — ABNORMAL HIGH (ref 70–99)
Glucose-Capillary: 98 mg/dL (ref 70–99)
Glucose-Capillary: 99 mg/dL (ref 70–99)
Glucose-Capillary: 101 mg/dL — ABNORMAL HIGH (ref 70–99)
Glucose-Capillary: 101 mg/dL — ABNORMAL HIGH (ref 70–99)
Glucose-Capillary: 108 mg/dL — ABNORMAL HIGH (ref 70–99)
Glucose-Capillary: 110 mg/dL — ABNORMAL HIGH (ref 70–99)
Glucose-Capillary: 111 mg/dL — ABNORMAL HIGH (ref 70–99)
Glucose-Capillary: 113 mg/dL — ABNORMAL HIGH (ref 70–99)
Glucose-Capillary: 114 mg/dL — ABNORMAL HIGH (ref 70–99)
Glucose-Capillary: 115 mg/dL — ABNORMAL HIGH (ref 70–99)
Glucose-Capillary: 117 mg/dL — ABNORMAL HIGH (ref 70–99)
Glucose-Capillary: 119 mg/dL — ABNORMAL HIGH (ref 70–99)
Glucose-Capillary: 124 mg/dL — ABNORMAL HIGH (ref 70–99)
Glucose-Capillary: 141 mg/dL — ABNORMAL HIGH (ref 70–99)
Glucose-Capillary: 144 mg/dL — ABNORMAL HIGH (ref 70–99)

## 2014-01-25 LAB — BASIC METABOLIC PANEL
Anion gap: 12 (ref 5–15)
BUN: 17 mg/dL (ref 6–23)
CHLORIDE: 100 meq/L (ref 96–112)
CO2: 27 mEq/L (ref 19–32)
Calcium: 8.5 mg/dL (ref 8.4–10.5)
Creatinine, Ser: 0.76 mg/dL (ref 0.50–1.35)
GFR calc non Af Amer: >90 mL/min (ref >90)
GLUCOSE: 106 mg/dL — AB (ref 70–99)
Potassium: 3.8 mEq/L (ref 3.7–5.3)
Sodium: 139 mEq/L (ref 137–147)

## 2014-01-25 LAB — CBC
HCT: 29.5 % — ABNORMAL LOW (ref 39.0–52.0)
Hemoglobin: 10.1 g/dL — ABNORMAL LOW (ref 13.0–17.0)
MCH: 28.5 pg (ref 26.0–34.0)
MCHC: 34.2 g/dL (ref 30.0–36.0)
MCV: 83.3 fL (ref 78.0–100.0)
PLATELETS: 85 10*3/uL — AB (ref 150–400)
RBC: 3.54 MIL/uL — ABNORMAL LOW (ref 4.22–5.81)
RDW: 16 % — AB (ref 11.5–15.5)
WBC: 15.7 10*3/uL — ABNORMAL HIGH (ref 4.0–10.5)

## 2014-01-25 LAB — CARBOXYHEMOGLOBIN
Carboxyhemoglobin: 1.5 % (ref 0.5–1.5)
Methemoglobin: 0.5 % (ref 0.0–1.5)
O2 Saturation: 67.6 %
Total hemoglobin: 9.9 g/dL — ABNORMAL LOW (ref 13.5–18.0)

## 2014-01-25 LAB — PROTIME-INR
INR: 1.32 (ref 0.00–1.49)
Prothrombin Time: 16.4 seconds — ABNORMAL HIGH (ref 11.6–15.2)

## 2014-01-25 MED ORDER — FUROSEMIDE 10 MG/ML IJ SOLN: 40.0000 mg | Freq: Once | INTRAMUSCULAR | Status: DC

## 2014-01-25 MED ORDER — INSULIN DETEMIR 100 UNIT/ML ~~LOC~~ SOLN: 15.0000 [IU] | Freq: Every day | SUBCUTANEOUS | Status: DC

## 2014-01-25 MED ORDER — METOCLOPRAMIDE HCL 5 MG/ML IJ SOLN
5.0000 mg | Freq: Three times a day (TID) | INTRAMUSCULAR | Status: DC
  Administered 2014-01-25 – 2014-01-26 (×2): 5 mg via INTRAVENOUS
  Filled 2014-01-25 (×2): qty 2
  Filled 2014-01-25: qty 1

## 2014-01-25 MED ORDER — FUROSEMIDE 10 MG/ML IJ SOLN
40.0000 mg | Freq: Two times a day (BID) | INTRAMUSCULAR | Status: DC
  Administered 2014-01-25 – 2014-01-26 (×3): 40 mg via INTRAVENOUS
  Filled 2014-01-25 (×4): qty 4

## 2014-01-25 MED ORDER — METOPROLOL TARTRATE 25 MG/10 ML ORAL SUSPENSION
25.0000 mg | Freq: Two times a day (BID) | ORAL | Status: DC
  Filled 2014-01-25 (×3): qty 10

## 2014-01-25 MED ORDER — LACTULOSE 10 GM/15ML PO SOLN
20.0000 g | Freq: Every day | ORAL | Status: DC | PRN
  Administered 2014-01-25: 20 g via ORAL
  Filled 2014-01-25 (×2): qty 30

## 2014-01-25 MED ORDER — METOPROLOL TARTRATE 25 MG PO TABS
25.0000 mg | ORAL_TABLET | Freq: Two times a day (BID) | ORAL | Status: DC
  Administered 2014-01-25 – 2014-01-26 (×2): 25 mg via ORAL
  Filled 2014-01-25 (×3): qty 1

## 2014-01-25 MED ORDER — ALPRAZOLAM 0.25 MG PO TABS
0.2500 mg | ORAL_TABLET | Freq: Every evening | ORAL | Status: DC | PRN
  Administered 2014-01-25: 0.25 mg via ORAL
  Filled 2014-01-25: qty 1

## 2014-01-25 MED ORDER — WARFARIN - PHYSICIAN DOSING INPATIENT
Freq: Every day | Status: DC
  Administered 2014-01-25: 2.5

## 2014-01-25 MED ORDER — INSULIN ASPART 100 UNIT/ML ~~LOC~~ SOLN
0.0000 [IU] | SUBCUTANEOUS | Status: DC
Start: 2014-01-25 — End: 2014-01-26
  Administered 2014-01-25 – 2014-01-26 (×3): 2 [IU] via SUBCUTANEOUS

## 2014-01-25 MED ORDER — INSULIN DETEMIR 100 UNIT/ML ~~LOC~~ SOLN
15.0000 [IU] | Freq: Every day | SUBCUTANEOUS | Status: DC
  Administered 2014-01-25: 15 [IU] via SUBCUTANEOUS
  Filled 2014-01-25 (×2): qty 0.15

## 2014-01-25 MED ORDER — INSULIN DETEMIR 100 UNIT/ML ~~LOC~~ SOLN: 15.0000 [IU] | Freq: Once | SUBCUTANEOUS | Status: DC

## 2014-01-25 MED ORDER — WARFARIN SODIUM 2.5 MG PO TABS
2.5000 mg | ORAL_TABLET | Freq: Once | ORAL | Status: AC
  Administered 2014-01-25: 2.5 mg via ORAL
  Filled 2014-01-25: qty 1

## 2014-01-25 NOTE — Progress Notes (Signed)
Patient ID: [name] [name], male   DOB: 07/11/1955, 58 y.o.   MRN: [id number] EVENING ROUNDS NOTE :     [address]       [name]             [phone]                 2 Days Post-Op Procedure(s) (LRB): BENTALL PROCEDURE (N/A) REDO AORTIC VALVE REPLACEMENT (AVR) with 23 St. Jude Mechanical Aortic Valved Graft (N/A) REDO STERNOTOMY (N/A) INTRAOPERATIVE TRANSESOPHAGEAL ECHOCARDIOGRAM (N/A)  Total Length of Stay:  LOS: 2 days  BP 126/70  Pulse 82  Temp(Src) 98.5 F (36.9 C) (Oral)  Resp 22  Ht 5\' 10"  (1.778 m)  Wt 211 lb 10.3 oz (96 kg)  BMI 30.37 kg/m2  SpO2 94%  .Intake/Output     10/03 0701 - 10/04 0700   P.O.    I.V. (mL/kg) 199.1 (2.1)   NG/GT    IV Piggyback    Total Intake(mL/kg) 199.1 (2.1)   Urine (mL/kg/hr) 2060 (1.7)   Emesis/NG output 20 (0)   Chest Tube 60 (0)   Total Output 2140   Net -1940.9       Urine Occurrence 1 x     . sodium chloride    . sodium chloride    . dexmedetomidine Stopped (01/24/14 1513)  . DOPamine 3 mcg/kg/min (01/25/14 1900)  . lactated ringers    . milrinone 0.1 mcg/kg/min (01/25/14 1200)  . nitroGLYCERIN 30 mcg/min (01/25/14 1900)  . phenylephrine (NEO-SYNEPHRINE) Adult infusion Stopped (01/24/14 1524)     Lab Results  Component Value Date   WBC 15.7* 01/25/2014   HGB 10.1* 01/25/2014   HCT 29.5* 01/25/2014   PLT 85* 01/25/2014   GLUCOSE 106* 01/25/2014   CHOL 127 04/05/2013   TRIG 64.0 04/05/2013   HDL 50.90 04/05/2013   LDLDIRECT 181.4 11/16/2012   LDLCALC 63 04/05/2013   ALT 21 01/24/2014   AST 64* 01/24/2014   NA 139 01/25/2014   K 3.8 01/25/2014   CL 100 01/25/2014   CREATININE 0.76 01/25/2014   BUN 17 01/25/2014   CO2 27 01/25/2014   INR 1.32 01/25/2014   HGBA1C 6.1* 01/20/2014   Stable day, still some nausea   Delight OvensEdward B Sritha Chauncey MD  Beeper [phone] Office [phone] 01/25/2014 7:32 PM

## 2014-01-25 NOTE — Progress Notes (Signed)
Patient C/O insomnia.  Patient has been awake for more than 20 hours.  States he takes Xanax 0.25 mg PO Q HS for sleep.  Dr. Tyrone SageGerhardt notified.  No orders rec'd. Continue comfort measures.

## 2014-01-25 NOTE — Progress Notes (Signed)
Patient ID: [name] [name], male   DOB: Dec 02, 1955, 58 y.o.   MRN: [id number] [facility] DAILY ICU PROGRESS NOTE                   [address]            [place] [address]          [phone]   2 Days Post-Op Procedure(s) (LRB): BENTALL PROCEDURE (N/A) REDO AORTIC VALVE REPLACEMENT (AVR) with 23 St. Jude Mechanical Aortic Valved Graft (N/A) REDO STERNOTOMY (N/A) INTRAOPERATIVE TRANSESOPHAGEAL ECHOCARDIOGRAM (N/A)  Total Length of Stay:  LOS: 2 days   Subjective: Awake and alert neuro inact, complaint of constipation  Objective: Vital signs in last 24 hours: Temp:  [96.6 F (35.9 C)-99 F (37.2 C)] 98.4 F (36.9 C) (10/03 0700) Pulse Rate:  [77-99] 86 (10/03 1000) Cardiac Rhythm:  [-] Normal sinus rhythm (10/03 0800) Resp:  [0-32] 23 (10/03 1000) BP: (100-146)/(41-70) 120/58 mmHg (10/03 1000) SpO2:  [91 %-100 %] 94 % (10/03 1000) Arterial Line BP: (96-173)/(47-73) 135/55 mmHg (10/03 1000) FiO2 (%):  [40 %-50 %] 40 % (10/02 1357) Weight:  [211 lb 10.3 oz (96 kg)] 211 lb 10.3 oz (96 kg) (10/03 0500)  Filed Weights   01/23/14 1745 01/24/14 0500 01/25/14 0500  Weight: 192 lb 14.4 oz (87.5 kg) 213 lb 13.5 oz (97 kg) 211 lb 10.3 oz (96 kg)    Weight change: 18 lb 11.8 oz (8.5 kg)   Hemodynamic parameters for last 24 hours: PAP: (20-42)/(11-27) 22/12 mmHg CVP:  [11 mmHg-14 mmHg] 12 mmHg CO:  [2.6 L/min-8.5 L/min] 8.5 L/min CI:  [2.6 L/min/m2-4.2 L/min/m2] 4.2 L/min/m2  Intake/Output from previous day: 10/02 0701 - 10/03 0700 In: 2434.1 [P.O.:240; I.V.:1674.1; NG/GT:100; IV Piggyback:420] Out: 3745 [Urine:2730; Emesis/NG output:495; Chest Tube:520]  Intake/Output this shift: Total I/O In: 44.3 [I.V.:44.3] Out: 1285 [Urine:1235; Emesis/NG output:20; Chest Tube:30]  Current Meds: Scheduled Meds: . acetaminophen  1,000 mg Oral 4 times per day   Or  . acetaminophen (TYLENOL) oral liquid 160 mg/5 mL  1,000 mg Per Tube 4 times per day  . acetaminophen  (TYLENOL) oral liquid 160 mg/5 mL  650 mg Per Tube Once   Or  . acetaminophen  650 mg Rectal Once  . antiseptic oral rinse  7 mL Mouth Rinse q12n4p  . aspirin EC  325 mg Oral Daily   Or  . aspirin  324 mg Per Tube Daily  . bisacodyl  10 mg Oral Daily   Or  . bisacodyl  10 mg Rectal Daily  . budesonide-formoterol  2 puff Inhalation BID  . chlorhexidine  15 mL Mouth Rinse BID  . Chlorhexidine Gluconate Cloth  6 each Topical Q0600  . docusate sodium  200 mg Oral Daily  . ezetimibe-simvastatin  1 tablet Oral QHS  . furosemide  40 mg Intravenous BID  . furosemide  40 mg Intravenous Once  . insulin aspart  0-24 Units Subcutaneous 6 times per day  . insulin detemir  15 Units Subcutaneous Daily  . insulin detemir  15 Units Subcutaneous Once  . [START ON 01/26/2014] insulin detemir  15 Units Subcutaneous Daily  . insulin regular  0-10 Units Intravenous TID WC  . metoprolol tartrate  12.5 mg Oral BID   Or  . metoprolol tartrate  12.5 mg Per Tube BID  . mupirocin ointment  1 application Nasal BID  . pantoprazole  40 mg Oral Daily  . sodium chloride  10-40 mL Intracatheter  Q12H  . sodium chloride  3 mL Intravenous Q12H  . warfarin  2.5 mg Oral ONCE-1800   Continuous Infusions: . sodium chloride    . sodium chloride    . dexmedetomidine Stopped (01/24/14 1513)  . DOPamine 2.987 mcg/kg/min (01/25/14 0900)  . insulin (NOVOLIN-R) infusion 2.8 Units/hr (01/25/14 0900)  . lactated ringers    . milrinone 0.3 mcg/kg/min (01/25/14 0926)  . nitroGLYCERIN 30 mcg/min (01/25/14 0900)  . phenylephrine (NEO-SYNEPHRINE) Adult infusion Stopped (01/24/14 1524)   PRN Meds:.levalbuterol, metoprolol, midazolam, morphine injection, ondansetron (ZOFRAN) IV, oxyCODONE, sodium chloride, sodium chloride, traMADol  General appearance: alert, cooperative and no distress Neurologic: intact Heart: regular rate and rhythm, S1, S2 normal, no murmur, click, rub or gallop Lungs: diminished breath sounds  bibasilar Abdomen: soft, non-tender; bowel sounds normal; no masses,  no organomegaly Extremities: extremities normal, atraumatic, no cyanosis or edema and Homans sign is negative, no sign of DVT Wound: sternum stable dressing in place, rt infrclavicular incision intact  Lab Results: CBC: Recent Labs  01/24/14 1550 01/25/14 0018  WBC 20.5* 15.7*  HGB 10.9* 10.1*  HCT 32.1* 29.5*  PLT 104* 85*   BMET:  Recent Labs  01/24/14 1615 01/25/14 0323  NA 138 139  K 3.4* 3.8  CL 98 100  CO2 18* 27  GLUCOSE 186* 106*  BUN 17 17  CREATININE 0.75 0.76  CALCIUM 8.2* 8.5    PT/INR:  Recent Labs  01/25/14 0323  LABPROT 16.4*  INR 1.32   Radiology: Dg Chest Port 1 View  01/25/2014   CLINICAL DATA:  Atelectasis. Left-sided chest tube. Hypertension. Asthma. Ascending aortic aneurysm.  EXAM: PORTABLE CHEST - 1 VIEW  COMPARISON:  1 day prior  FINDINGS: Swan-Ganz catheter removed with right IJ Cordis sheath remaining. Extubation. Removal of nasogastric tube. Bilateral chest tubes remain in place. Removal of mediastinal drain.  Cardiomegaly accentuated by AP portable technique. Possible small left pleural effusion, similar. No pneumothorax. Low lung volumes with resultant pulmonary interstitial prominence. Similar left and slight increase in right base atelectasis.  IMPRESSION: Extubation with slight decreased lung volumes and worsened right base aeration. Left base atelectasis remains.  Bilateral chest tubes remain in place, without pneumothorax.   Electronically Signed   By: Jeronimo GreavesKyle  Talbot M.D.   On: 01/25/2014 10:00   Dg Chest Portable 1 View In Am  01/24/2014   CLINICAL DATA:  Postop from aortic valve replacement. On ventilator.  EXAM: PORTABLE CHEST - 1 VIEW  COMPARISON:  01/23/2014  FINDINGS: Support lines and tubes in appropriate position. No pneumothorax identified on today's exam.  Mild increased atelectasis noted in both lung bases since prior exam. Cardiomegaly remains stable.  IMPRESSION:  Mild increase in bibasilar atelectasis since prior exam. No definite pneumothorax visualized.   Electronically Signed   By: Myles RosenthalJohn  Stahl M.D.   On: 01/24/2014 08:10   Dg Chest Portable 1 View  01/23/2014   CLINICAL DATA:  Postop heart surgery  EXAM: PORTABLE CHEST - 1 VIEW  COMPARISON:  01/20/2014.  FINDINGS: Endotracheal tube terminates 5.5 cm above the carina.  Right IJ Swan-Ganz catheter terminates distally in the right lower lobe pulmonary artery. Withdrawal approximately 3 cm is suggested.  Bilateral chest tubes and mediastinal drain.  No pneumothorax.  Left basilar atelectasis.  Cardiomegaly.  Prosthetic valve.  Median sternotomy.  Surgical clips overlying the right upper chest wall/axilla.  Enteric tube courses into the stomach.  IMPRESSION: Endotracheal tube terminates 5.5 cm above the carina.  Right IJ Swan-Ganz catheter terminates distally  in the right lower lobe pulmonary artery. Withdrawal approximately 3 cm is suggested.  Bilateral chest tubes and mediastinal drain.  No pneumothorax.   Electronically Signed   By: Charline Bills M.D.   On: 01/23/2014 18:12     Assessment/Plan: S/P Procedure(s) (LRB): BENTALL PROCEDURE (N/A) REDO AORTIC VALVE REPLACEMENT (AVR) with 23 St. Jude Mechanical Aortic Valved Graft (N/A) REDO STERNOTOMY (N/A) INTRAOPERATIVE TRANSESOPHAGEAL ECHOCARDIOGRAM (N/A) Mobilize Diuresis Diabetes control d/c tubes/lines See progression orders Expected Acute  Blood - loss Anemia Mild thrombocytopenia  Start coumadin today    Andera Cranmer B 01/25/2014 10:34 AM

## 2014-01-26 ENCOUNTER — Inpatient Hospital Stay (HOSPITAL_COMMUNITY): Payer: BC Managed Care – PPO

## 2014-01-26 LAB — GLUCOSE, CAPILLARY
Glucose-Capillary: 106 mg/dL — ABNORMAL HIGH (ref 70–99)
Glucose-Capillary: 118 mg/dL — ABNORMAL HIGH (ref 70–99)
Glucose-Capillary: 119 mg/dL — ABNORMAL HIGH (ref 70–99)
Glucose-Capillary: 122 mg/dL — ABNORMAL HIGH (ref 70–99)
Glucose-Capillary: 91 mg/dL (ref 70–99)
Glucose-Capillary: 99 mg/dL (ref 70–99)

## 2014-01-26 LAB — BASIC METABOLIC PANEL
Anion gap: 8 (ref 5–15)
BUN: 24 mg/dL — ABNORMAL HIGH (ref 6–23)
CO2: 31 mEq/L (ref 19–32)
Calcium: 8.8 mg/dL (ref 8.4–10.5)
Chloride: 99 mEq/L (ref 96–112)
Creatinine, Ser: 0.73 mg/dL (ref 0.50–1.35)
GFR calc Af Amer: >90 mL/min (ref >90)
GFR calc non Af Amer: >90 mL/min (ref >90)
Glucose, Bld: 105 mg/dL — ABNORMAL HIGH (ref 70–99)
Potassium: 3.6 mEq/L — ABNORMAL LOW (ref 3.7–5.3)
Sodium: 138 mEq/L (ref 137–147)

## 2014-01-26 LAB — CBC
HCT: 30.3 % — ABNORMAL LOW (ref 39.0–52.0)
Hemoglobin: 10.1 g/dL — ABNORMAL LOW (ref 13.0–17.0)
MCH: 28.2 pg (ref 26.0–34.0)
MCHC: 33.3 g/dL (ref 30.0–36.0)
MCV: 84.6 fL (ref 78.0–100.0)
Platelets: 93 10*3/uL — ABNORMAL LOW (ref 150–400)
RBC: 3.58 MIL/uL — ABNORMAL LOW (ref 4.22–5.81)
RDW: 16.5 % — ABNORMAL HIGH (ref 11.5–15.5)
WBC: 12.8 10*3/uL — ABNORMAL HIGH (ref 4.0–10.5)

## 2014-01-26 LAB — PROTIME-INR
INR: 1.38 (ref 0.00–1.49)
Prothrombin Time: 17 seconds — ABNORMAL HIGH (ref 11.6–15.2)

## 2014-01-26 LAB — CARBOXYHEMOGLOBIN
Carboxyhemoglobin: 1.2 % (ref 0.5–1.5)
Methemoglobin: 0.9 % (ref 0.0–1.5)
O2 Saturation: 53.1 %
Total hemoglobin: 10 g/dL — ABNORMAL LOW (ref 13.5–18.0)

## 2014-01-26 MED ORDER — SODIUM CHLORIDE 0.9 % IJ SOLN: 3.0000 mL | INTRAMUSCULAR | Status: DC | PRN

## 2014-01-26 MED ORDER — WARFARIN SODIUM 5 MG PO TABS
5.0000 mg | ORAL_TABLET | ORAL | Status: DC
  Administered 2014-01-26 – 2014-01-28 (×3): 5 mg via ORAL
  Filled 2014-01-26 (×4): qty 1

## 2014-01-26 MED ORDER — INSULIN DETEMIR 100 UNIT/ML ~~LOC~~ SOLN
10.0000 [IU] | Freq: Every day | SUBCUTANEOUS | Status: DC
  Administered 2014-01-26 – 2014-01-28 (×3): 10 [IU] via SUBCUTANEOUS
  Filled 2014-01-26 (×4): qty 0.1

## 2014-01-26 MED ORDER — WARFARIN - PHYSICIAN DOSING INPATIENT
Freq: Every day | Status: DC
  Administered 2014-01-27: 18:00:00

## 2014-01-26 MED ORDER — BISACODYL 10 MG RE SUPP: 10.0000 mg | Freq: Every day | RECTAL | Status: DC | PRN

## 2014-01-26 MED ORDER — SODIUM CHLORIDE 0.9 % IJ SOLN
3.0000 mL | Freq: Two times a day (BID) | INTRAMUSCULAR | Status: DC
  Administered 2014-01-26: 3 mL via INTRAVENOUS

## 2014-01-26 MED ORDER — MOVING RIGHT ALONG BOOK
Freq: Once | Status: AC
Start: 2014-01-26 — End: 2014-01-26
  Administered 2014-01-26: 10:00:00
  Filled 2014-01-26: qty 1

## 2014-01-26 MED ORDER — MAGNESIUM HYDROXIDE 400 MG/5ML PO SUSP: 30.0000 mL | Freq: Every day | ORAL | Status: DC | PRN

## 2014-01-26 MED ORDER — POTASSIUM CHLORIDE CRYS ER 20 MEQ PO TBCR
20.0000 meq | EXTENDED_RELEASE_TABLET | Freq: Every day | ORAL | Status: DC
  Administered 2014-01-26: 20 meq via ORAL
  Filled 2014-01-26 (×2): qty 1

## 2014-01-26 MED ORDER — TRAMADOL HCL 50 MG PO TABS
50.0000 mg | ORAL_TABLET | ORAL | Status: DC | PRN
  Administered 2014-01-27 – 2014-01-28 (×2): 50 mg via ORAL
  Filled 2014-01-26: qty 1
  Filled 2014-01-26: qty 2

## 2014-01-26 MED ORDER — FUROSEMIDE 10 MG/ML IJ SOLN: 40.0000 mg | Freq: Once | INTRAMUSCULAR | Status: DC

## 2014-01-26 MED ORDER — PANTOPRAZOLE SODIUM 40 MG PO TBEC
40.0000 mg | DELAYED_RELEASE_TABLET | Freq: Every day | ORAL | Status: DC
  Administered 2014-01-27 – 2014-01-29 (×3): 40 mg via ORAL
  Filled 2014-01-26 (×2): qty 1

## 2014-01-26 MED ORDER — SODIUM CHLORIDE 0.9 % IV SOLN: 250.0000 mL | INTRAVENOUS | Status: DC | PRN

## 2014-01-26 MED ORDER — ALPRAZOLAM 0.25 MG PO TABS
0.2500 mg | ORAL_TABLET | Freq: Every evening | ORAL | Status: DC | PRN
  Administered 2014-01-26 – 2014-01-28 (×3): 0.25 mg via ORAL
  Filled 2014-01-26 (×3): qty 1

## 2014-01-26 MED ORDER — BISACODYL 5 MG PO TBEC: 10.0000 mg | DELAYED_RELEASE_TABLET | Freq: Every day | ORAL | Status: DC | PRN

## 2014-01-26 MED ORDER — DOCUSATE SODIUM 100 MG PO CAPS
200.0000 mg | ORAL_CAPSULE | Freq: Every day | ORAL | Status: DC
  Administered 2014-01-27: 200 mg via ORAL
  Filled 2014-01-26 (×3): qty 2

## 2014-01-26 MED ORDER — ASPIRIN EC 81 MG PO TBEC
81.0000 mg | DELAYED_RELEASE_TABLET | Freq: Every day | ORAL | Status: DC
  Administered 2014-01-27 – 2014-01-29 (×3): 81 mg via ORAL
  Filled 2014-01-26 (×3): qty 1

## 2014-01-26 MED ORDER — POTASSIUM CHLORIDE 10 MEQ/50ML IV SOLN
10.0000 meq | INTRAVENOUS | Status: AC | PRN
  Administered 2014-01-26 (×3): 10 meq via INTRAVENOUS
  Filled 2014-01-26 (×3): qty 50

## 2014-01-26 MED ORDER — INSULIN ASPART 100 UNIT/ML ~~LOC~~ SOLN
0.0000 [IU] | Freq: Three times a day (TID) | SUBCUTANEOUS | Status: DC
  Administered 2014-01-27: 1 [IU] via SUBCUTANEOUS

## 2014-01-26 MED ORDER — WARFARIN SODIUM 2.5 MG PO TABS: 2.5000 mg | ORAL_TABLET | ORAL | Status: DC

## 2014-01-26 MED ORDER — FUROSEMIDE 40 MG PO TABS
40.0000 mg | ORAL_TABLET | Freq: Every day | ORAL | Status: DC
  Administered 2014-01-27: 40 mg via ORAL
  Filled 2014-01-26 (×2): qty 1

## 2014-01-26 MED ORDER — OXYCODONE HCL 5 MG PO TABS: 5.0000 mg | ORAL_TABLET | ORAL | Status: DC | PRN

## 2014-01-26 MED ORDER — WARFARIN SODIUM 2.5 MG PO TABS
2.5000 mg | ORAL_TABLET | Freq: Every day | ORAL | Status: DC
Start: 2014-01-26 — End: 2014-01-26

## 2014-01-26 NOTE — Plan of Care (Signed)
Problem: Phase II - Intermediate Post-Op Goal: Wean to Extubate Outcome: Completed/Met Date Met:  01/24/14 Extubated POD 1 after Nitric Oxide weaned down Goal: Advance Diet Outcome: Progressing Pt with some persistent nausea and distension, new medications orders, different foods tried

## 2014-01-26 NOTE — Progress Notes (Signed)
Patient ID: [name] [name], male   DOB: 11-Oct-1955, 58 y.o.   MRN: [id number] TCTS DAILY ICU PROGRESS NOTE                   [address]            [place] [id number]          [phone]   3 Days Post-Op Procedure(s) (LRB): BENTALL PROCEDURE (N/A) REDO AORTIC VALVE REPLACEMENT (AVR) with 23 St. Jude Mechanical Aortic Valved Graft (N/A) REDO STERNOTOMY (N/A) INTRAOPERATIVE TRANSESOPHAGEAL ECHOCARDIOGRAM (N/A)  Total Length of Stay:  LOS: 3 days   Subjective: Feels well, neuro intact  Objective: Vital signs in last 24 hours: Temp:  [97.4 F (36.3 C)-98.7 F (37.1 C)] 97.4 F (36.3 C) (10/04 0700) Pulse Rate:  [64-86] 73 (10/04 0800) Cardiac Rhythm:  [-] Normal sinus rhythm (10/04 0800) Resp:  [11-24] 23 (10/04 0800) BP: (98-130)/(55-75) 124/66 mmHg (10/04 0800) SpO2:  [90 %-95 %] 90 % (10/04 0800) Arterial Line BP: (135-150)/(55-63) 140/57 mmHg (10/03 1700) Weight:  [204 lb 9.4 oz (92.8 kg)] 204 lb 9.4 oz (92.8 kg) (10/04 0500)  Filed Weights   01/24/14 0500 01/25/14 0500 01/26/14 0500  Weight: 213 lb 13.5 oz (97 kg) 211 lb 10.3 oz (96 kg) 204 lb 9.4 oz (92.8 kg)    Weight change: -7 lb 0.9 oz (-3.2 kg)   Hemodynamic parameters for last 24 hours:    Intake/Output from previous day: 10/03 0701 - 10/04 0700 In: 636.3 [I.V.:536.3; IV Piggyback:100] Out: 3040 [Urine:2960; Emesis/NG output:20; Chest Tube:60]  Intake/Output this shift: Total I/O In: 60 [I.V.:10; IV Piggyback:50] Out: 600 [Urine:600]  Current Meds: Scheduled Meds: . acetaminophen  1,000 mg Oral 4 times per day   Or  . acetaminophen (TYLENOL) oral liquid 160 mg/5 mL  1,000 mg Per Tube 4 times per day  . acetaminophen (TYLENOL) oral liquid 160 mg/5 mL  650 mg Per Tube Once   Or  . acetaminophen  650 mg Rectal Once  . antiseptic oral rinse  7 mL Mouth Rinse q12n4p  . aspirin EC  325 mg Oral Daily   Or  . aspirin  324 mg Per Tube Daily  . bisacodyl  10 mg Oral Daily   Or  .  bisacodyl  10 mg Rectal Daily  . budesonide-formoterol  2 puff Inhalation BID  . chlorhexidine  15 mL Mouth Rinse BID  . Chlorhexidine Gluconate Cloth  6 each Topical Q0600  . docusate sodium  200 mg Oral Daily  . ezetimibe-simvastatin  1 tablet Oral QHS  . furosemide  40 mg Intravenous BID  . furosemide  40 mg Intravenous Once  . insulin aspart  0-24 Units Subcutaneous 6 times per day  . insulin detemir  15 Units Subcutaneous Daily  . metoCLOPramide (REGLAN) injection  5 mg Intravenous 3 times per day  . metoprolol tartrate  25 mg Oral BID   Or  . metoprolol tartrate  25 mg Per Tube BID  . mupirocin ointment  1 application Nasal BID  . pantoprazole  40 mg Oral Daily  . sodium chloride  10-40 mL Intracatheter Q12H  . sodium chloride  3 mL Intravenous Q12H  . Warfarin - Physician Dosing Inpatient   Does not apply q1800   Continuous Infusions: . sodium chloride 10 mL/hr at 01/26/14 0500  . sodium chloride    . dexmedetomidine Stopped (01/24/14 1513)  . DOPamine Stopped (01/26/14 0400)  . lactated ringers Stopped (01/26/14  0600)  . milrinone 0.1 mcg/kg/min (01/25/14 1200)  . nitroGLYCERIN Stopped (01/25/14 2200)  . phenylephrine (NEO-SYNEPHRINE) Adult infusion Stopped (01/24/14 1524)   PRN Meds:.ALPRAZolam, lactulose, levalbuterol, metoprolol, midazolam, morphine injection, ondansetron (ZOFRAN) IV, oxyCODONE, sodium chloride, sodium chloride, traMADol  General appearance: alert and cooperative Neurologic: intact Heart: regular rate and rhythm, S1, S2 normal, no murmur, click, rub or gallop Lungs: clear to auscultation bilaterally Abdomen: soft, non-tender; bowel sounds normal; no masses,  no organomegaly Extremities: extremities normal, atraumatic, no cyanosis or edema and Homans sign is negative, no sign of DVT Wound: sternum stable  Lab Results: CBC: Recent Labs  01/25/14 0018 01/26/14 0400  WBC 15.7* 12.8*  HGB 10.1* 10.1*  HCT 29.5* 30.3*  PLT 85* 93*   BMET:    Recent Labs  01/25/14 0323 01/26/14 0400  NA 139 138  K 3.8 3.6*  CL 100 99  CO2 27 31  GLUCOSE 106* 105*  BUN 17 24*  CREATININE 0.76 0.73  CALCIUM 8.5 8.8    PT/INR:  Recent Labs  01/26/14 0400  LABPROT 17.0*  INR 1.38   Radiology: Dg Chest Port 1 View  01/26/2014   CLINICAL DATA:  postop cardiac surgery AVR  EXAM: PORTABLE CHEST - 1 VIEW  COMPARISON:  the previous day's study  FINDINGS: Left chest tube and mediastinal drain have been removed. No pneumothorax. Right IJ venous sheath stable. Previous median sternotomy and AVR. Persistent patchy bibasilar atelectasis or infiltrates. Heart size upper limits normal for technique. Blunting of the right lateral costophrenic angle suggesting small effusion. No effusion. Visualized skeletal structures are unremarkable.  IMPRESSION: 1. Removal of left chest tube and mediastinal drain with no pneumothorax. 2. Possible small right pleural effusion   Electronically Signed   By: Oley Balm M.D.   On: 01/26/2014 08:38   Dg Chest Port 1 View  01/25/2014   CLINICAL DATA:  Atelectasis. Left-sided chest tube. Hypertension. Asthma. Ascending aortic aneurysm.  EXAM: PORTABLE CHEST - 1 VIEW  COMPARISON:  1 day prior  FINDINGS: Swan-Ganz catheter removed with right IJ Cordis sheath remaining. Extubation. Removal of nasogastric tube. Bilateral chest tubes remain in place. Removal of mediastinal drain.  Cardiomegaly accentuated by AP portable technique. Possible small left pleural effusion, similar. No pneumothorax. Low lung volumes with resultant pulmonary interstitial prominence. Similar left and slight increase in right base atelectasis.  IMPRESSION: Extubation with slight decreased lung volumes and worsened right base aeration. Left base atelectasis remains.  Bilateral chest tubes remain in place, without pneumothorax.   Electronically Signed   By: Jeronimo Greaves M.D.   On: 01/25/2014 10:00   Lab Results  Component Value Date   INR 1.38 01/26/2014    INR 1.32 01/25/2014   INR 1.46 01/23/2014     Assessment/Plan: S/P Procedure(s) (LRB): BENTALL PROCEDURE (N/A) REDO AORTIC VALVE REPLACEMENT (AVR) with 23 St. Jude Mechanical Aortic Valved Graft (N/A) REDO STERNOTOMY (N/A) INTRAOPERATIVE TRANSESOPHAGEAL ECHOCARDIOGRAM (N/A) Mobilize Diuresis Plan for transfer to step-down: see transfer orders Started on coumadin    Kinston Magnan B 01/26/2014 9:09 AM

## 2014-01-27 ENCOUNTER — Telehealth: Payer: Self-pay | Admitting: Pharmacist Clinician (PhC)/ Clinical Pharmacy Specialist

## 2014-01-27 ENCOUNTER — Encounter (HOSPITAL_COMMUNITY): Payer: Self-pay | Admitting: Cardiothoracic Surgery

## 2014-01-27 ENCOUNTER — Inpatient Hospital Stay (HOSPITAL_COMMUNITY): Payer: BC Managed Care – PPO

## 2014-01-27 LAB — TYPE AND SCREEN
ABO/RH(D): B POS
Antibody Screen: NEGATIVE
Unit division: 0
Unit division: 0
Unit division: 0
Unit division: 0

## 2014-01-27 LAB — GLUCOSE, CAPILLARY
Glucose-Capillary: 96 mg/dL (ref 70–99)
Glucose-Capillary: 121 mg/dL — ABNORMAL HIGH (ref 70–99)
Glucose-Capillary: 113 mg/dL — ABNORMAL HIGH (ref 70–99)
Glucose-Capillary: 116 mg/dL — ABNORMAL HIGH (ref 70–99)

## 2014-01-27 LAB — BASIC METABOLIC PANEL
Anion gap: 13 (ref 5–15)
BUN: 23 mg/dL (ref 6–23)
CO2: 28 mEq/L (ref 19–32)
Calcium: 8.6 mg/dL (ref 8.4–10.5)
Chloride: 98 mEq/L (ref 96–112)
Creatinine, Ser: 0.88 mg/dL (ref 0.50–1.35)
GFR calc Af Amer: >90 mL/min (ref >90)
GFR calc non Af Amer: >90 mL/min (ref >90)
Glucose, Bld: 109 mg/dL — ABNORMAL HIGH (ref 70–99)
Potassium: 3.2 mEq/L — ABNORMAL LOW (ref 3.7–5.3)
Sodium: 139 mEq/L (ref 137–147)

## 2014-01-27 LAB — PROTIME-INR
INR: 1.43 (ref 0.00–1.49)
Prothrombin Time: 17.5 seconds — ABNORMAL HIGH (ref 11.6–15.2)

## 2014-01-27 LAB — CBC
HCT: 32.4 % — ABNORMAL LOW (ref 39.0–52.0)
Hemoglobin: 10.5 g/dL — ABNORMAL LOW (ref 13.0–17.0)
MCH: 27.9 pg (ref 26.0–34.0)
MCHC: 32.4 g/dL (ref 30.0–36.0)
MCV: 85.9 fL (ref 78.0–100.0)
Platelets: 139 10*3/uL — ABNORMAL LOW (ref 150–400)
RBC: 3.77 MIL/uL — ABNORMAL LOW (ref 4.22–5.81)
RDW: 16.5 % — ABNORMAL HIGH (ref 11.5–15.5)
WBC: 13.4 10*3/uL — ABNORMAL HIGH (ref 4.0–10.5)

## 2014-01-27 LAB — BLOOD PRODUCT ORDER (VERBAL) VERIFICATION

## 2014-01-27 MED ORDER — METOPROLOL TARTRATE 12.5 MG HALF TABLET
12.5000 mg | ORAL_TABLET | Freq: Two times a day (BID) | ORAL | Status: DC
  Administered 2014-01-27 – 2014-01-29 (×5): 12.5 mg via ORAL
  Filled 2014-01-27 (×6): qty 1

## 2014-01-27 MED ORDER — POTASSIUM CHLORIDE CRYS ER 20 MEQ PO TBCR
40.0000 meq | EXTENDED_RELEASE_TABLET | Freq: Once | ORAL | Status: AC
  Administered 2014-01-27: 40 meq via ORAL
  Filled 2014-01-27: qty 2

## 2014-01-27 MED ORDER — SORBITOL 70 % SOLN
30.0000 mL | Freq: Every day | Status: AC
  Administered 2014-01-27 – 2014-01-28 (×2): 30 mL via ORAL
  Filled 2014-01-27 (×2): qty 30

## 2014-01-27 MED ORDER — AMIODARONE HCL 200 MG PO TABS
400.0000 mg | ORAL_TABLET | Freq: Two times a day (BID) | ORAL | Status: DC
  Administered 2014-01-27 – 2014-01-29 (×5): 400 mg via ORAL
  Filled 2014-01-27 (×7): qty 2

## 2014-01-27 MED ORDER — POTASSIUM CHLORIDE CRYS ER 20 MEQ PO TBCR
40.0000 meq | EXTENDED_RELEASE_TABLET | Freq: Every day | ORAL | Status: AC
  Administered 2014-01-27 – 2014-01-28 (×2): 40 meq via ORAL
  Filled 2014-01-27: qty 2

## 2014-01-27 MED ORDER — ACETAMINOPHEN 325 MG PO TABS
650.0000 mg | ORAL_TABLET | Freq: Four times a day (QID) | ORAL | Status: DC | PRN
  Administered 2014-01-27 – 2014-01-28 (×2): 650 mg via ORAL
  Filled 2014-01-27 (×2): qty 2

## 2014-01-27 MED ORDER — LACTULOSE 10 GM/15ML PO SOLN
20.0000 g | Freq: Once | ORAL | Status: AC
  Administered 2014-01-27: 20 g via ORAL
  Filled 2014-01-27: qty 30

## 2014-01-27 NOTE — Progress Notes (Addendum)
      [address]       [place] [id number]             [phone]        4 Days Post-Op Procedure(s) (LRB): BENTALL PROCEDURE (N/A) REDO AORTIC VALVE REPLACEMENT (AVR) with 23 St. Jude Mechanical Aortic Valved Graft (N/A) REDO STERNOTOMY (N/A) INTRAOPERATIVE TRANSESOPHAGEAL ECHOCARDIOGRAM (N/A)  Subjective: Patient has not had a bowel movement yet.  Objective: Vital signs in last 24 hours: Temp:  [97.6 F (36.4 C)-98.2 F (36.8 C)] 97.6 F (36.4 C) (10/05 0606) Pulse Rate:  [70-88] 82 (10/05 0606) Cardiac Rhythm:  [-] Normal sinus rhythm (10/04 1940) Resp:  [12-23] 18 (10/05 0606) BP: (104-145)/(55-78) 145/78 mmHg (10/05 0606) SpO2:  [88 %-95 %] 95 % (10/05 0606) Weight:  [200 lb 9.9 oz (91 kg)] 200 lb 9.9 oz (91 kg) (10/05 0606)  Pre op weight 88 kg Current Weight  01/27/14 200 lb 9.9 oz (91 kg)      Intake/Output from previous day: 10/04 0701 - 10/05 0700 In: 190 [P.O.:120; I.V.:20; IV Piggyback:50] Out: 1140 [Urine:1140]   Physical Exam:  Cardiovascular: RRR, no murmur Pulmonary: Some crackles at bases. Abdomen: Soft, non tender, bowel sounds present. Extremities: Trace bilateral lower extremity edema. Wounds: Clean and dry.  No erythema or signs of infection.  Lab Results: CBC: Recent Labs  01/26/14 0400 01/27/14 0335  WBC 12.8* 13.4*  HGB 10.1* 10.5*  HCT 30.3* 32.4*  PLT 93* 139*   BMET:  Recent Labs  01/26/14 0400 01/27/14 0335  NA 138 139  K 3.6* 3.2*  CL 99 98  CO2 31 28  GLUCOSE 105* 109*  BUN 24* 23  CREATININE 0.73 0.88  CALCIUM 8.8 8.6    PT/INR:  Lab Results  Component Value Date   INR 1.43 01/27/2014   INR 1.38 01/26/2014   INR 1.32 01/25/2014   ABG:  INR: Will add last result for INR, ABG once components are confirmed Will add last 4 CBG results once components are confirmed  Assessment/Plan:  1. CV - SR in the 80's. On Coumadin. INR slightly increased from 1.38 to 1.43. Will start  Lopressor. 2.  Pulmonary - CXR this am appears to show no pneumothorax, small bilateral pleural effusions and atelectasis, cardiomegaly. Encourage incentive spirometer. 3. Volume Overload - On Lasix 40 daily 4.  Acute blood loss anemia - H and H stable at 10.5 and 32.4 5. Thrombocytopenia-platelets up to 139,000 6. Supplement potassium 7. DM-CBGs 119/118/99. On Insulin. Pre op HGA1C 6.1. 8. Remove EPW in am 9. LOC constipation 10. Will be ready for discharge once INR closer to being therapeutic  ZIMMERMAN,DONIELLE MPA-C 01/27/2014,7:39 AM  Patient having runs A-fib w/ ambulation-- HR 180/min Start po amiodarone and monitor interaction with coumadin Will need SNF as he lives alone F/U CXR 10-7 to assess pleural effusions  patient examined and medical record reviewed,agree with above note. VAN TRIGT III,Adrina Armijo 01/27/2014

## 2014-01-27 NOTE — Telephone Encounter (Signed)
Closed encounter °

## 2014-01-27 NOTE — Progress Notes (Signed)
Pt ambulated in hallway independently without complication. Pt ambulated approximately 36550ft. Heart rate and rhythm remain stable.

## 2014-01-27 NOTE — Progress Notes (Signed)
CARDIAC REHAB PHASE I   PRE:  Rate/Rhythm: 90 SR    BP: sitting 122/70    SaO2: 86 RA lying flat, 90 EOB  MODE:  Ambulation: 550 ft   POST:  Rate/Rhythm: 108 ST then 200 afib, back to 108 ST with rest    BP: sitting 141/70     SaO2: 94 RA  Pt c/o HA. Moving well, d/c'd RW 1/2 way through walk, continued steady. Toward end of walk pt went into Afib, rate hitting 200 at times. Resolved with rest in recliner, back to 108 ST. Pt admits to SOB. Will f/u, encouraged x2 more walks. Does not need RW. Prob needs O2 when lying flat. [phone]   Elissa LovettReeve, Hassie Mandt AltusKristan CES, ACSM 01/27/2014 10:33 AM

## 2014-01-27 NOTE — Progress Notes (Signed)
Pt ambulated in hallway 33000ft without complication. Monitoring will continue.

## 2014-01-27 NOTE — Discharge Summary (Signed)
Physician Discharge Summary       [address]       [name] [id number]             [phone]    Patient ID: [name] [name] MRN: [id number] DOB/AGE: [birth year] 58 y.o.  Admit date: 01/23/2014 Discharge date: 01/29/2014  Admission Diagnoses: 1. 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 History of hypertension 3. History of hyperlipidemia 4. History of tobacco abuse  Discharge Diagnoses:  1. 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 History of hypertension 3. History of hyperlipidemia 4. History of tobacco abuse 5. ABL anemia 6. Thrombocytopenia 7. Post op a fib (converted to sinus rhythm)  Procedure (s):  1. Redo sternotomy, right axillary artery cannulation for arterial inflow.  2. Redo AVR with total aortic root replacement using a mechanical valve-conduit (St. Jude 23 mm aortic valve and 25 mm Hemashield conduit, serial #[id number]).  3. Placement of femoral A-line for blood pressure monitoring by Dr. Donata ClayVan Trigt on 01/23/2014.  History of Presenting Illness: This is a 58 year old Caucasian male who was seen and evaluated for aortic root replacement for a 5.2 cm ascending aneurysm at the sino-tubular junction . The patient has history of bicuspid aortic valve and is status post aortic valve replacement(with a 23 mm St. 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 The patient is asymptomatic. He underwent left heart cath by Dr. Nicholaus BloomKelley which demonstrated normal coronaries, normal right heart pressures, and normal cardiac output. The patient takes Coumadin daily.  The patient has normal LV function by echocardiogram.  Pre operative carotid duplex US showed no significant carotid artery stenosis bilaterally. Potential  risks, benefits, and complications were discussed with the patient and he agreed to proceed with surgery. He underwent a redo sternotomy, Bentall, and placement of femoral a line on 01/23/2014.  Brief Hospital Course:  The patient was extubated the morning of surgery without difficulty. He remained afebrile and hemodynamically stable. He was weaned off of Nitric Oxide, dopamine, Milrinone, and epinephrine drips. Theone MurdochSwan Ganz, a line, chest tubes, and foley were removed early in the post operative course. He had ABL anemia. He did not require a post op transfusion. His last H and H was 10.5 and 32.4. He also had thrombocytopenia. His platelets were up to 139,000. The patient's HGA1C pre op was 6.1. He was started on Coumadin. His PT and INR were monitored daily. His last INR was 1.87. Lopressor was started and titrated accordingly. He was volume over loaded and diuresed. He  was weaned off the insulin drip. He was restarted on Insulin. Glucose remained under good control. The patient was felt surgically stable for transfer from the ICU to [facility] for further convalescence on 01/26/2013. He continues to progress with cardiac rehab. He was ambulating on room air. He has been tolerating a diet and has had a bowel movement. He went into a fib with RVR on 10/5 in the early afternoon. He was given Amiodarone. He converted and has been maintaining sinus rhythm.  Epicardial pacing wires and chest tube sutures have been removed. Dr. Donata ClayVan Trigt has seen and evaluated the patient and he is felt surgically stable for discharge today. Home health will be arranged. We will ask that they remove the remaining  right axilla staples on  Monday 02/03/2014. Also, draw a PT and INR on Friday 01/31/2014 and call or fax results to Dr. Elvis Coil office.   Latest Vital Signs: Blood pressure 127/85, pulse 65, temperature 97.7 F (36.5 C), temperature source Oral, resp. rate 18, height 5\' 10"  (1.778 m), weight 194 lb 0.1 oz (88 kg), SpO2  97.00%.  Physical Exam: Cardiovascular: RRR, no murmur. Sharp valve click. Pulmonary: Some crackles at bases.  Abdomen: Soft, non tender, bowel sounds present.  Extremities: Trace bilateral lower extremity edema.  Wounds: Clean and dry. No erythema or signs of infection.   Discharge Condition:Stable  Recent laboratory studies:  Lab Results  Component Value Date   WBC 10.6* 01/29/2014   HGB 9.6* 01/29/2014   HCT 28.8* 01/29/2014   MCV 84.2 01/29/2014   PLT 175 01/29/2014   Lab Results  Component Value Date   NA 140 01/29/2014   K 3.7 01/29/2014   CL 102 01/29/2014   CO2 25 01/29/2014   CREATININE 0.89 01/29/2014   GLUCOSE 116* 01/29/2014      Diagnostic Studies: Dg Chest 2 View  01/27/2014   CLINICAL DATA:  Atypical chest pain. Patient is status post aortic valve replacement for aortic regurgitation  EXAM: CHEST  2 VIEW  COMPARISON:  January 26, 2014  FINDINGS: Temporary pacemaker wires remain attached to the right heart. Cordis has been removed. No pneumothorax. There are small pleural effusions bilaterally with mild left base atelectatic change. Elsewhere lungs are clear. Heart is enlarged with pulmonary vascularity within normal limits. Patient is status post aortic valve replacement. Skin staples remain over the right upper hemithorax.  IMPRESSION: No pneumothorax. Small pleural effusions bilaterally with mild left base atelectasis. Mild cardiomegaly stable.   Electronically Signed   By: Bretta Bang M.D.   On: 01/27/2014 07:42      Discharge Instructions   Amb Referral to Cardiac Rehabilitation    Complete by:  As directed            Discharge Medications:   Medication List    STOP taking these medications       hydrochlorothiazide 25 MG tablet  Commonly known as:  HYDRODIURIL     ibuprofen 200 MG tablet  Commonly known as:  ADVIL,MOTRIN      TAKE these medications       albuterol 108 (90 BASE) MCG/ACT inhaler  Commonly known as:  PROVENTIL HFA;VENTOLIN HFA   Inhale 2 puffs into the lungs every 6 (six) hours as needed for wheezing or shortness of breath.     ALPRAZolam 0.5 MG tablet  Commonly known as:  XANAX  Take 0.25 mg by mouth daily as needed for anxiety.     amiodarone 200 MG tablet  Commonly known as:  PACERONE  Take 1 tablet (200 mg total) by mouth 2 (two) times daily. For 7 days then take Amiodarone 200 mg by mouth daily thereafter.     aspirin 81 MG EC tablet  Take 1 tablet (81 mg total) by mouth daily.     diphenhydramine-acetaminophen 25-500 MG Tabs  Commonly known as:  TYLENOL PM  Take 1 tablet by mouth at bedtime as needed (sleep).     ezetimibe-simvastatin 10-20 MG per tablet  Commonly known as:  VYTORIN  Take 1 tablet by mouth at bedtime.     furosemide 40 MG tablet  Commonly known as:  LASIX  Take 1 tablet (40 mg total) by mouth daily. For 5 days then stop.  metoprolol tartrate 25 MG tablet  Commonly known as:  LOPRESSOR  Take 0.5 tablets (12.5 mg total) by mouth 2 (two) times daily.     OVER THE COUNTER MEDICATION  Take 3 tablets by mouth daily as needed (stomach issues). Medication from [facility] with Lactace enzymes     oxyCODONE 5 MG immediate release tablet  Commonly known as:  Oxy IR/ROXICODONE  Take 1-2 tablets (5-10 mg total) by mouth every 4 (four) hours as needed for severe pain.     Potassium Chloride ER 20 MEQ Tbcr  Take 20 mEq by mouth daily. For 5 days then stop.     warfarin 5 MG tablet  Commonly known as:  COUMADIN  Take 2.5-5 mg by mouth daily. 2.5mg  on Friday and 5mg  all other days          1.Beta Blocker:  Yes [ x  ]                              No   [   ]                              If No, reason:  2.Ace Inhibitor/ARB: Yes [   ]                                     No  [ x   ]                                     If No, reason:Labile blood pressure  3.Statin:   Yes [ x  ]                  No  [   ]                  If No, reason:  4.Ecasa:  Yes  [ x  ]                  No   [    ]                  If No, reason:  Follow Up Appointments: Follow-up Information   Follow up with [facility] R, NP On 02/10/2014. (Appointment time is at 9:30 am)    Specialty:  Cardiology   Contact information:   [address] [place] [address] [phone]       Follow up with Home Health On 01/31/2014. (Please draw PT and INR on 01/31/2014. Call or fax results to Dr. Elvis Coil office [phone];fax [phone]))       Follow up with VAN Dinah Beers, MD On 02/26/2014. (PA/LAT CXR to be taken (at [facility] which is in the same building as Dr. Zenaida Niece Trigt's office) on 02/26/2014 at 11:30 am;Appointment with Dr. Donata Clay is at 12:30 pm)    Specialty:  Cardiothoracic Surgery   Contact information:   [address] [place] [address] [phone]       Follow up with Home Health. (Please remove remaining staples in right axilla on 02/03/2014)       Signed: ZIMMERMAN,DONIELLE MPA-C 01/29/2014, 11:34 AM

## 2014-01-28 ENCOUNTER — Inpatient Hospital Stay (HOSPITAL_COMMUNITY): Payer: BC Managed Care – PPO

## 2014-01-28 LAB — PROTIME-INR
INR: 1.56 — ABNORMAL HIGH (ref 0.00–1.49)
Prothrombin Time: 18.7 seconds — ABNORMAL HIGH (ref 11.6–15.2)

## 2014-01-28 LAB — GLUCOSE, CAPILLARY
Glucose-Capillary: 96 mg/dL (ref 70–99)
Glucose-Capillary: 119 mg/dL — ABNORMAL HIGH (ref 70–99)
Glucose-Capillary: 102 mg/dL — ABNORMAL HIGH (ref 70–99)
Glucose-Capillary: 104 mg/dL — ABNORMAL HIGH (ref 70–99)

## 2014-01-28 MED ORDER — FUROSEMIDE 10 MG/ML IJ SOLN
40.0000 mg | Freq: Every day | INTRAMUSCULAR | Status: DC
  Administered 2014-01-28 – 2014-01-29 (×2): 40 mg via INTRAVENOUS
  Filled 2014-01-28 (×2): qty 4

## 2014-01-28 NOTE — Progress Notes (Signed)
Pacing wires removed per order and protocol, wires intact upon removal, pt tolerated well, pt made aware of bedrest for 1 hour and stated understanding, will continue to monitor closely Archie BalboaStein, Runette Scifres G, RN

## 2014-01-28 NOTE — Progress Notes (Addendum)
      [address]       [place] [id number]             [phone]        5 Days Post-Op Procedure(s) (LRB): BENTALL PROCEDURE (N/A) REDO AORTIC VALVE REPLACEMENT (AVR) with 23 St. Jude Mechanical Aortic Valved Graft (N/A) REDO STERNOTOMY (N/A) INTRAOPERATIVE TRANSESOPHAGEAL ECHOCARDIOGRAM (N/A)  Subjective: Patient with a bowel movement. Feels he is sleep deprived.  Objective: Vital signs in last 24 hours: Temp:  [97.7 F (36.5 C)-99.5 F (37.5 C)] 98.2 F (36.8 C) (10/06 0642) Pulse Rate:  [74-87] 74 (10/06 0642) Cardiac Rhythm:  [-] Normal sinus rhythm (10/05 1930) Resp:  [18-20] 18 (10/06 0642) BP: (108-140)/(50-71) 108/50 mmHg (10/06 0642) SpO2:  [91 %-94 %] 91 % (10/06 0642) Weight:  [199 lb 1.2 oz (90.3 kg)] 199 lb 1.2 oz (90.3 kg) (10/06 0500)  Pre op weight 88 kg Current Weight  01/28/14 199 lb 1.2 oz (90.3 kg)      Intake/Output from previous day: 10/05 0701 - 10/06 0700 In: 360 [P.O.:360] Out: 150 [Urine:150]   Physical Exam:  Cardiovascular: RRR, no murmur Pulmonary: Slightly diminished at bases Abdomen: Soft, non tender, bowel sounds present. Extremities: Trace bilateral lower extremity edema. Wounds: Clean and dry.  No erythema or signs of infection.  Lab Results: CBC:  Recent Labs  01/26/14 0400 01/27/14 0335  WBC 12.8* 13.4*  HGB 10.1* 10.5*  HCT 30.3* 32.4*  PLT 93* 139*   BMET:   Recent Labs  01/26/14 0400 01/27/14 0335  NA 138 139  K 3.6* 3.2*  CL 99 98  CO2 31 28  GLUCOSE 105* 109*  BUN 24* 23  CREATININE 0.73 0.88  CALCIUM 8.8 8.6    PT/INR:  Lab Results  Component Value Date   INR 1.56* 01/28/2014   INR 1.43 01/27/2014   INR 1.38 01/26/2014   ABG:  INR: Will add last result for INR, ABG once components are confirmed Will add last 4 CBG results once components are confirmed  Assessment/Plan:  1. CV - A fib with RVR yesterday.SR in the 70's this am. On Amiodarone 400 bid, Lopressor 12.5  bid, and Coumadin. INR slightly increased from 1.43 to 1.56. 2.  Pulmonary - Will check CXR in am to further evaluate effusoins. Encourage incentive spirometer. 3. Volume Overload - On Lasix 40 daily 4.  Acute blood loss anemia - H and H stable at 10.5 and 32.4 5. Thrombocytopenia-platelets up to 139,000 6. DM-CBGs 113/116/96. On Insulin. Pre op HGA1C 6.1. 7. Remove EPW  8. Will remove every other axillary staple day of discharge 9. Will be ready for discharge to SNF once INR closer to being therapeutic (mechanical valve), likely Friday  ZIMMERMAN,DONIELLE MPA-C 01/28/2014,7:27 AM  Add prn Ambien IV lasix for mild interstitial edema, min effusions Cont coumadin load patient examined and medical record reviewed,agree with above note. VAN TRIGT III,PETER 01/28/2014

## 2014-01-28 NOTE — Clinical Social Work Note (Signed)
This Education officer, museum met with patient to discuss SNF placement, however PT was finishing treatment and informed this Education officer, museum that she is not recommending SNF placement due to patient's progress.  Patient agreeable to plans for discharge back home, RN case manager aware CSW will sign off.  Jones Broom. [place], MSW, [place] 01/28/2014 11:49 AM

## 2014-01-28 NOTE — Evaluation (Addendum)
Physical Therapy Evaluation Patient Details Name: [name] [name] MRN: [id number] DOB: [birth year] Today's Date: 01/28/2014   History of Present Illness  Pt admitted with thoracic ascending aortic aneurysm and is now s/p BENTALL PROCEDURE, REDO AORTIC VALVE REPLACEMENT (AVR) with 23 St. Jude Mechanical Aortic Valved Graft ,REDO STERNOTOMY , and INTRAOPERATIVE TRANSESOPHAGEAL ECHOCARDIOGRAM .  Clinical Impression  Pt admitted with above. Pt currently with functional limitations due to the deficits listed below (see PT Problem List).  Pt will benefit from skilled PT to increase their independence and safety with mobility to allow discharge to the venue listed below. Pt able to ambulate 400' without AD and with S.  He was able to perform bed mobility and transfers while following sternal precautions with cueing. Pt did report fatigue, with o2 95% on RA and HR 86.  Pt will need stair training and activity tolerance training in acute care, but do not feel he will need any PT post acute care.  Pt educated on MD recommendation to have 24 hour S after d/c.  He states that his brother lives across the road and can be there some, but not 24 hours.      Follow Up Recommendations No PT follow up    Equipment Recommendations  None recommended by PT    Recommendations for Other Services       Precautions / Restrictions Precautions Precautions: Sternal Restrictions Weight Bearing Restrictions: Yes (sternal precautions)      Mobility  Bed Mobility Overal bed mobility: Modified Independent             General bed mobility comments: Able to perform with bed flat without use of UE   Transfers Overall transfer level: Modified independent               General transfer comment: able to stand without pushing up with UE  Ambulation/Gait Ambulation/Gait assistance: Supervision Ambulation Distance (Feet): 400 Feet Assistive device: None Gait Pattern/deviations: Step-through pattern      General Gait Details: Able to perform items from DGI with no LOB, but DGI not formally performed.  Pt able to start/stop, change head position, and do quick turns without LOB.   Stairs            Wheelchair Mobility    Modified Rankin (Stroke Patients Only)       Balance Overall balance assessment: No apparent balance deficits (not formally assessed)                                           Pertinent Vitals/Pain Pain Assessment: Faces Faces Pain Scale: No hurt o2 95% RA and HR 84-86 bpm after gait    Home Living Family/patient expects to be discharged to:: Private residence Living Arrangements: Alone Available Help at Discharge: Family (brother lives across the road) Type of Home: House Home Access: Stairs to enter   Secretary/administratorntrance Stairs-Number of Steps: 3 Home Layout: One level Home Equipment: None      Prior Function Level of Independence: Independent         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     Hand Dominance        Extremity/Trunk Assessment   Upper Extremity Assessment: Overall WFL for tasks assessed           Lower Extremity Assessment: Overall WFL for tasks assessed      Cervical / Trunk Assessment: Normal  Communication   Communication: No difficulties  Cognition Arousal/Alertness: Awake/alert Behavior During Therapy: WFL for tasks assessed/performed Overall Cognitive Status: Within Functional Limits for tasks assessed                      General Comments      Exercises        Assessment/Plan    PT Assessment Patient needs continued PT services  PT Diagnosis Difficulty walking   PT Problem List Decreased activity tolerance;Decreased mobility  PT Treatment Interventions Stair training;Gait training;Functional mobility training;Therapeutic activities;Therapeutic exercise   PT Goals (Current goals can be found in the Care Plan section) Acute Rehab PT Goals Patient Stated Goal: Go home PT Goal  Formulation: With patient Time For Goal Achievement: 02/11/14 Potential to Achieve Goals: Good    Frequency Min 3X/week   Barriers to discharge        Co-evaluation               End of Session   Activity Tolerance: Patient tolerated treatment well Patient left: in chair Nurse Communication: Mobility status         Time: 1610-9604 PT Time Calculation (min): 18 min   Charges:   PT Evaluation $Initial PT Evaluation Tier I: 1 Procedure PT Treatments $Gait Training: 8-22 mins   PT G Codes:          Reyan Helle LUBECK 01/28/2014, 11:23 AM

## 2014-01-28 NOTE — Progress Notes (Signed)
CARDIAC REHAB PHASE I   PRE:  Rate/Rhythm: 73 SR    BP: sitting 143/78    SaO2: 97 RA  MODE:  Ambulation: 840 ft   POST:  Rate/Rhythm: 87 SR    BP: sitting 137/80     SaO2: 97 RA  Tolerated well. No c/o except general fatigue. Sts he slept better last night. VSS, to recliner.  [id number]   Elissa LovettReeve, Fatma Rutten Butte MeadowsKristan CES, ACSM 01/28/2014 2:43 PM

## 2014-01-28 NOTE — Care Management Note (Signed)
    Page 1 of 2   01/29/2014     4:59:19 PM CARE MANAGEMENT NOTE 01/29/2014  Patient:  [name] [name],[name] [name]   Account Number:  [id number]  Date Initiated:  01/28/2014  Documentation initiated by:  Baily Hovanec  Subjective/Objective Assessment:   Pt s/p redo AVR with aortic root replacement on 01/23/14. PTA, pt lives alone and is independent.  Pt's brother lives across the street from him.     Action/Plan:   PT recommending no follow up at dc.  MD mentioned SNF at dc, but insurance will not pay, as pt has no skilled need for SNF.   Anticipated DC Date:  01/29/2014   Anticipated DC Plan:  HOME/SELF CARE      DC Planning Services  CM consult      [facility]  [facility]   Choice offered to / List presented to:  C-1 Patient        HH arranged  HH-1 RN      [facility]  [facility]   Status of service:  Completed, signed off Medicare Important Message given?   (If response is "NO", the following Medicare IM given date fields will be blank) Date Medicare IM given:   Medicare IM given by:   Date Additional Medicare IM given:   Additional Medicare IM given by:    Discharge Disposition:  HOME W HOME HEALTH SERVICES  Per UR Regulation:  Reviewed for med. necessity/level of care/duration of stay  If discussed at Long Length of Stay Meetings, dates discussed:    Comments:  01/29/14 Sidney AceJulie Taunya Goral, RN, BSN [phone] Pt for dc home today with brother to assist.  Pt needs HHRN to follow up for PT/INR draws.  [facility].  Start of care 24-48h post dc date.   01/28/14 Sidney AceJulie Patricia Perales, RN, BSN [phone] Pt walking independently, and without assistive device. Will need to encourage pt to have brother stay with him as much as possible at dc.  Will discuss with pt.

## 2014-01-28 NOTE — Progress Notes (Signed)
Pt transferred into room 2w11, tele notified, pt's belongings transferred with him Archie BalboaStein, Tareq Dwan G, RN

## 2014-01-29 ENCOUNTER — Ambulatory Visit: Payer: BC Managed Care – PPO | Admitting: Pharmacist Clinician (PhC)/ Clinical Pharmacy Specialist

## 2014-01-29 LAB — GLUCOSE, CAPILLARY
Glucose-Capillary: 96 mg/dL (ref 70–99)
Glucose-Capillary: 110 mg/dL — ABNORMAL HIGH (ref 70–99)

## 2014-01-29 LAB — BASIC METABOLIC PANEL
Anion gap: 13 (ref 5–15)
BUN: 18 mg/dL (ref 6–23)
CO2: 25 mEq/L (ref 19–32)
Calcium: 8.7 mg/dL (ref 8.4–10.5)
Chloride: 102 mEq/L (ref 96–112)
Creatinine, Ser: 0.89 mg/dL (ref 0.50–1.35)
GFR calc Af Amer: >90 mL/min (ref >90)
GFR calc non Af Amer: >90 mL/min (ref >90)
Glucose, Bld: 116 mg/dL — ABNORMAL HIGH (ref 70–99)
Potassium: 3.7 mEq/L (ref 3.7–5.3)
Sodium: 140 mEq/L (ref 137–147)

## 2014-01-29 LAB — CBC
HCT: 28.8 % — ABNORMAL LOW (ref 39.0–52.0)
Hemoglobin: 9.6 g/dL — ABNORMAL LOW (ref 13.0–17.0)
MCH: 28.1 pg (ref 26.0–34.0)
MCHC: 33.3 g/dL (ref 30.0–36.0)
MCV: 84.2 fL (ref 78.0–100.0)
Platelets: 175 10*3/uL (ref 150–400)
RBC: 3.42 MIL/uL — ABNORMAL LOW (ref 4.22–5.81)
RDW: 16.5 % — ABNORMAL HIGH (ref 11.5–15.5)
WBC: 10.6 10*3/uL — ABNORMAL HIGH (ref 4.0–10.5)

## 2014-01-29 LAB — PROTIME-INR
INR: 1.87 — ABNORMAL HIGH (ref 0.00–1.49)
Prothrombin Time: 21.5 seconds — ABNORMAL HIGH (ref 11.6–15.2)

## 2014-01-29 MED ORDER — AMIODARONE HCL 200 MG PO TABS: 200.0000 mg | ORAL_TABLET | Freq: Two times a day (BID) | ORAL | Status: DC

## 2014-01-29 MED ORDER — POTASSIUM CHLORIDE ER 20 MEQ PO TBCR: 20.0000 meq | EXTENDED_RELEASE_TABLET | Freq: Every day | ORAL | Status: DC

## 2014-01-29 MED ORDER — POTASSIUM CHLORIDE CRYS ER 20 MEQ PO TBCR
30.0000 meq | EXTENDED_RELEASE_TABLET | Freq: Once | ORAL | Status: AC
  Administered 2014-01-29: 30 meq via ORAL
  Filled 2014-01-29: qty 1

## 2014-01-29 MED ORDER — FUROSEMIDE 40 MG PO TABS: 40.0000 mg | ORAL_TABLET | Freq: Every day | ORAL | Status: DC

## 2014-01-29 MED ORDER — METOPROLOL TARTRATE 25 MG PO TABS: 12.5000 mg | ORAL_TABLET | Freq: Two times a day (BID) | ORAL | Status: DC

## 2014-01-29 MED ORDER — OXYCODONE HCL 5 MG PO TABS: 5.0000 mg | ORAL_TABLET | ORAL | Status: DC | PRN

## 2014-01-29 MED ORDER — POTASSIUM CHLORIDE CRYS ER 20 MEQ PO TBCR
20.0000 meq | EXTENDED_RELEASE_TABLET | Freq: Every day | ORAL | Status: DC
  Administered 2014-01-29: 20 meq via ORAL
  Filled 2014-01-29: qty 1

## 2014-01-29 MED ORDER — ASPIRIN 81 MG PO TBEC: 81.0000 mg | DELAYED_RELEASE_TABLET | Freq: Every day | ORAL | Status: DC

## 2014-01-29 NOTE — Discharge Instructions (Signed)
Aortic Valve Replacement, Care After °Refer to this sheet in the next few weeks. These instructions provide you with information on caring for yourself after your procedure. Your health care provider may also give you specific instructions. Your treatment has been planned according to current medical practices, but problems sometimes occur. Call your health care provider if you have any problems or questions after your procedure. °HOME CARE INSTRUCTIONS  °· Take medicines only as directed by your health care provider. °· If your health care provider has prescribed elastic stockings, wear them as directed. °· Take frequent naps or rest often throughout the day. °· Avoid lifting over 10 lbs (4.5 kg) or pushing or pulling things with your arms for 6-8 weeks or as directed by your health care provider. °· Avoid driving or airplane travel for 4-6 weeks after surgery or as directed by your health care provider. If you are riding in a car for an extended period, stop every 1-2 hours to stretch your legs. Keep a record of your medicines and medical history with you when traveling. °· Do not drive or operate heavy machinery while taking pain medicine. (narcotics). °· Do not cross your legs. °· Do not use any tobacco products including cigarettes, chewing tobacco, or electronic cigarettes. If you need help quitting, ask your health care provider. °· Do not take baths, swim, or use a hot tub until your health care provider approves. Take showers once your health care provider approves. Pat incisions dry. Do not rub incisions with a washcloth or towel. °· Avoid climbing stairs and using the handrail to pull yourself up for the first 2-3 weeks after surgery. °· Return to work as directed by your health care provider. °· Drink enough fluid to keep your urine clear or pale yellow. °· Do not strain to have a bowel movement. Eat high-fiber foods if you become constipated. You may also take a medicine to help you have a bowel  movement (laxative) as directed by your health care provider. °· Resume sexual activity as directed by your health care provider. Men should not use medicines for erectile dysfunction until their doctor says it is okay. °· If you had a certain type of heart condition in the past, you may need to take antibiotic medicine before having dental work or surgery. Let your dentist and health care providers know if you had one or more of the following: °¨ Previous endocarditis. °¨ An artificial (prosthetic) heart valve. °¨ Congenital heart disease. °SEEK MEDICAL CARE IF: °· You develop a skin rash.   °· You experience sudden changes in your weight. °· You have a fever. °SEEK IMMEDIATE MEDICAL CARE IF:  °· You develop chest pain that is not coming from your incision. °· You have drainage (pus), redness, swelling, or pain at your incision site.   °· You develop shortness of breath or have difficulty breathing.   °· You have increased bleeding from your incision site.   °· You develop light-headedness.   °MAKE SURE YOU:  °· Understand these directions. °· Will watch your condition. °· Will get help right away if you are not doing well or get worse. °Document Released: 10/28/2004 Document Revised: 08/26/2013 Document Reviewed: 01/24/2012 °ExitCare® Patient Information ©2015 [employer]. This information is not intended to replace advice given to you by your health care provider. Make sure you discuss any questions you have with your health care provider. ° °

## 2014-01-29 NOTE — Progress Notes (Signed)
Ed completed with pt. Voiced understanding and set up video. [facility] and will send referral to [place]. [phone] Ethelda ChickKristan Lakina Mcintire CES, ACSM 10:17 AM 01/29/2014

## 2014-01-29 NOTE — Progress Notes (Addendum)
      [address]       [place] [id number]             [phone]        6 Days Post-Op Procedure(s) (LRB): BENTALL PROCEDURE (N/A) REDO AORTIC VALVE REPLACEMENT (AVR) with 23 St. Jude Mechanical Aortic Valved Graft (N/A) REDO STERNOTOMY (N/A) INTRAOPERATIVE TRANSESOPHAGEAL ECHOCARDIOGRAM (N/A)  Subjective: Patient feels well and wants to go home.  Objective: Vital signs in last 24 hours: Temp:  [97.7 F (36.5 C)-98.9 F (37.2 C)] 97.7 F (36.5 C) (10/07 0624) Pulse Rate:  [65-84] 65 (10/07 0624) Cardiac Rhythm:  [-] Normal sinus rhythm (10/06 2032) Resp:  [18] 18 (10/07 0624) BP: (117-143)/(56-85) 127/85 mmHg (10/07 0624) SpO2:  [94 %-97 %] 97 % (10/07 0624) Weight:  [194 lb 0.1 oz (88 kg)] 194 lb 0.1 oz (88 kg) (10/07 0624)  Pre op weight 88 kg Current Weight  01/29/14 194 lb 0.1 oz (88 kg)      Intake/Output from previous day: 10/06 0701 - 10/07 0700 In: 720 [P.O.:720] Out: -    Physical Exam:  Cardiovascular: RRR, no murmur Pulmonary: Slightly diminished at bases Abdomen: Soft, non tender, bowel sounds present. Extremities: Trace bilateral lower extremity edema. Wounds: Clean and dry.  No erythema or signs of infection.  Lab Results: CBC:  Recent Labs  01/27/14 0335 01/29/14 0348  WBC 13.4* 10.6*  HGB 10.5* 9.6*  HCT 32.4* 28.8*  PLT 139* 175   BMET:   Recent Labs  01/27/14 0335 01/29/14 0348  NA 139 140  K 3.2* 3.7  CL 98 102  CO2 28 25  GLUCOSE 109* 116*  BUN 23 18  CREATININE 0.88 0.89  CALCIUM 8.6 8.7    PT/INR:  Lab Results  Component Value Date   INR 1.87* 01/29/2014   INR 1.56* 01/28/2014   INR 1.43 01/27/2014   ABG:  INR: Will add last result for INR, ABG once components are confirmed Will add last 4 CBG results once components are confirmed  Assessment/Plan:  1. CV - A fib with RVR yesterday.SR in the 70's this am. On Amiodarone 400 bid, Lopressor 12.5 bid, and Coumadin. INR slightly increased  from 1.56 to 1.87. 2.  Pulmonary - CXR yesterday showed no pneumothorax, small bilateral pleural effusions and atelectasis, and pulmonary vascular congestion. Encourage incentive spirometer. 3. Volume Overload - Given IV Lasix yesterday. Continue oral today. 4.  Acute blood loss anemia - H and H stable at 9.6 and 28.8 5. Thrombocytopenia-platelets up to 139,000 6. DM-CBGs 102/104/96. On Insulin. Pre op HGA1C 6.1. 7. Will remove every other axillary staple day of discharge 8. Supplement potassium 9. PT not recommending SNF. [facility] will discuss disposition with surgeon.  ZIMMERMAN,DONIELLE MPA-C 01/29/2014,8:01 AM  DC home with HHN INR draw mon to cards coumadin clinic  patient examined and medical record reviewed,agree with above note. VAN TRIGT III,Morayma [place] 01/29/2014

## 2014-01-31 ENCOUNTER — Ambulatory Visit (INDEPENDENT_AMBULATORY_CARE_PROVIDER_SITE_OTHER): Payer: BC Managed Care – PPO | Admitting: Interventional Cardiology

## 2014-01-31 LAB — POCT INR: INR: 4.1

## 2014-02-04 NOTE — Discharge Summary (Signed)
patient examined and medical record reviewed,agree with above note. VAN TRIGT III,Aayra Hornbaker 02/04/2014   

## 2014-02-05 ENCOUNTER — Telehealth: Payer: Self-pay | Admitting: Cardiology

## 2014-02-05 NOTE — Telephone Encounter (Signed)
Close encounter 

## 2014-02-06 ENCOUNTER — Ambulatory Visit (INDEPENDENT_AMBULATORY_CARE_PROVIDER_SITE_OTHER): Payer: BC Managed Care – PPO | Admitting: Internal Medicine

## 2014-02-06 LAB — POCT INR: INR: 4.7

## 2014-02-10 ENCOUNTER — Encounter: Payer: Self-pay | Admitting: Cardiology

## 2014-02-10 ENCOUNTER — Ambulatory Visit (INDEPENDENT_AMBULATORY_CARE_PROVIDER_SITE_OTHER): Payer: BC Managed Care – PPO | Admitting: Cardiology

## 2014-02-10 ENCOUNTER — Ambulatory Visit
Admission: RE | Admit: 2014-02-10 | Discharge: 2014-02-10 | Disposition: A | Discharge status: Home / Self Care | Payer: BC Managed Care – PPO | Source: Ambulatory Visit | Attending: Cardiology | Admitting: Cardiology

## 2014-02-10 VITALS — BP 110/60 | HR 71 | Ht 70.0 in | Wt 188.0 lb

## 2014-02-10 DIAGNOSIS — Z7901 Long term (current) use of anticoagulants: Secondary | ICD-10-CM

## 2014-02-10 DIAGNOSIS — I359 Nonrheumatic aortic valve disorder, unspecified: Secondary | ICD-10-CM

## 2014-02-10 DIAGNOSIS — Z9889 Other specified postprocedural states: Secondary | ICD-10-CM

## 2014-02-10 DIAGNOSIS — Z952 Presence of prosthetic heart valve: Secondary | ICD-10-CM

## 2014-02-10 DIAGNOSIS — Z954 Presence of other heart-valve replacement: Secondary | ICD-10-CM

## 2014-02-10 DIAGNOSIS — I480 Paroxysmal atrial fibrillation: Secondary | ICD-10-CM

## 2014-02-10 DIAGNOSIS — I10 Essential (primary) hypertension: Secondary | ICD-10-CM

## 2014-02-10 MED ORDER — OXYCODONE HCL 5 MG PO TABS: 5.0000 mg | ORAL_TABLET | ORAL | Status: DC | PRN

## 2014-02-10 NOTE — Assessment & Plan Note (Signed)
Followed by coumadin clinic 

## 2014-02-10 NOTE — Progress Notes (Signed)
02/10/2014   PCP: Lupe CarneyMITCHELL,DEAN, MD   Chief Complaint  Patient presents with  . Follow-up    post hospital for aortic valver replacement.    Primary Cardiologist: Dr. P. Swazil[place]Jordan   HPI:  58 year old Caucasian male who was seen and evaluated for aortic root replacement for a 5.2 cm ascending aneurysm at the sino-tubular junction . The patient has history of bicuspid aortic valve and is status post aortic valve replacement(with a 23 mm St. 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 The patient is asymptomatic. He underwent left heart cath by Dr. Tresa EndoKelly which demonstrated normal coronaries, normal right heart pressures, and normal cardiac output. The patient takes Coumadin daily. The patient has normal LV function by echocardiogram. Pre operative carotid duplex US showed no significant carotid artery stenosis bilaterally. Potential risks, benefits, and complications were discussed with the patient and he agreed to proceed with surgery. He underwent a redo sternotomy, Redo AVR with total aortic root replacement using a mechanical valve-conduit (St. Jude 23 mm aortic valve and 25 mm Hemashield conduit, serial #[id number]).  Did require transfusion, + a fib with RVR, converted to SR, now on amiodarone at 200 mg daily maintaining SR.  Does complain of hot flash last pm and pain on his rt side when lying there.  Also out of pain meds and continues with chest incisional pain.  Feels, popping at times in sternum. Otherwise is active and walking.  His INR was elevated at 4 and is to have rechecked on Thursday.         No Known Allergies  Current Outpatient Prescriptions  Medication Sig Dispense Refill  . albuterol (PROVENTIL HFA;VENTOLIN HFA) 108 (90 BASE) MCG/ACT inhaler Inhale 2 puffs into the lungs every  6 (six) hours as needed for wheezing or shortness of breath.      . ALPRAZolam (XANAX) 0.5 MG tablet Take 0.25 mg by mouth daily as needed for anxiety.       Marland Kitchen. amiodarone (PACERONE) 200 MG tablet Take 1 tablet (200 mg total) by mouth 2 (two) times daily. For 7 days then take Amiodarone 200 mg by mouth daily thereafter.  30 tablet  1  . aspirin EC 81 MG EC tablet Take 1 tablet (81 mg total) by mouth daily.      . diphenhydramine-acetaminophen (TYLENOL PM) 25-500 MG TABS Take 1 tablet by mouth at bedtime as needed (sleep).      . ezetimibe-simvastatin (VYTORIN) 10-20 MG per tablet Take 1 tablet by mouth at bedtime.       . metoprolol tartrate (LOPRESSOR) 25 MG tablet Take 0.5 tablets (12.5 mg total) by mouth 2 (two) times daily.  30 tablet  1  . OVER THE COUNTER MEDICATION Take 3 tablets by mouth daily as needed (stomach issues). [facility] with Lactace enzymes      . oxyCODONE (OXY IR/ROXICODONE) 5 MG immediate release tablet Take 1-2 tablets (5-10 mg total) by mouth every 4 (four) hours as needed for severe pain.  30 tablet  0  . warfarin (COUMADIN) 5 MG tablet Take 2.5-5 mg by mouth daily. 2.5mg  on Friday and 5mg  all other days       No current facility-administered medications for this visit.    Past Medical History  Diagnosis Date  .  Chest pain, atypical   . Hypertension   . Hyperlipidemia   . Chronic anticoagulation   . S/P AVR     for endocarditis  . Ascending aortic aneurysm 01/07/2014  . Anxiety   . Depression   . Asthma     Past Surgical History  Procedure Laterality Date  . Aortic valve replacement  1988    23mm St. Jude valve  . Doppler echocardiography  06/12/1996    EF 55-60%  . Cardiac catheterization    . Hand surgery    . Bentall procedure N/A 01/23/2014    Procedure: BENTALL PROCEDURE;  Surgeon: Kerin PernaPeter Van Trigt, MD;  [facility];  Service: Open Heart Surgery;  Laterality: N/A;  . Aortic valve replacement N/A 01/23/2014    Procedure: REDO AORTIC VALVE  REPLACEMENT (AVR) with 23 St. Jude Mechanical Aortic Valved Graft;  Surgeon: Kerin PernaPeter Van Trigt, MD;  [facility];  Service: Open Heart Surgery;  Laterality: N/A;  . Intraoperative transesophageal echocardiogram N/A 01/23/2014    Procedure: INTRAOPERATIVE TRANSESOPHAGEAL ECHOCARDIOGRAM;  Surgeon: Kerin PernaPeter Van Trigt, MD;  [facility];  Service: Open Heart Surgery;  Laterality: N/A;    WUJ:WJXBJYN:WGROS:General:no colds or fevers, no weight changes Skin:no rashes or ulcers HEENT:no blurred vision, no congestion CV:see HPI- no awareness of rapid HR or palpitations  PUL:see HPI GI:no diarrhea constipation or melena, no indigestion GU:no hematuria, no dysuria MS:no joint pain, no claudication Neuro:no syncope, no lightheadedness Endo:no diabetes, no thyroid disease  Wt Readings from Last 3 Encounters:  02/10/14 188 lb (85.276 kg)  01/29/14 194 lb 0.1 oz (88 kg)  01/29/14 194 lb 0.1 oz (88 kg)    PHYSICAL EXAM BP 110/60  Pulse 71  Ht 5\' 10"  (1.778 m)  Wt 188 lb (85.276 kg)  BMI 26.98 kg/m2 General:Pleasant affect, NAD Skin:Warm and dry, brisk capillary refill HEENT:normocephalic, sclera clear, mucus membranes moist Neck:supple, no JVD, no bruits  Heart:S1S2 RRR without murmur, + click of valve, no gallup, rub or click Lungs:clear with crackles at bases, no rhonchi, or wheezes NFA:OZHYAbd:soft, non tender, + BS, do not palpate liver spleen or masses Ext:no lower ext edema, 2+ pedal pulses, 2+ radial pulses Neuro:alert and oriented, MAE, follows commands, + facial symmetry  EKG:SR 1st degree AV block, improved ST elevation   ASSESSMENT AND PLAN S/P AVR (aortic valve replacement)-redo with total aortic root replacement using a mechanical valve-conduit (St. Jude 23 mm aortic valve and 25 mm Hemashield conduit, serial #[id number]). Stable and progressing.  Will check CXR with crackles and his pain.  PAF (paroxysmal atrial fibrillation) Post op a fib on amiodarone daily 200 mg. He will follow up with Dr.  P. Sw[place]Jordan in 6 weeks.      HTN (hypertension) controlled  Chronic anticoagulation with coumadin  Followed by coumadin clinic

## 2014-02-10 NOTE — Patient Instructions (Signed)
A chest x-ray takes a picture of the organs and structures inside the chest, including the heart, lungs, and blood vessels. This test can show several things, including, whether the heart is enlarges; whether fluid is building up in the lungs; and whether pacemaker / defibrillator leads are still in place. This will done @ [address]  Your physician recommends that you schedule a follow-up appointment in: 3-4 weeks with Dr. SwazilandJordan.

## 2014-02-10 NOTE — Assessment & Plan Note (Signed)
Post op a fib on amiodarone daily 200 mg. He will follow up with Dr. P. SwazilandJordan in 6 weeks.

## 2014-02-10 NOTE — Assessment & Plan Note (Signed)
controlled 

## 2014-02-10 NOTE — Assessment & Plan Note (Addendum)
Stable and progressing.  Will check CXR with crackles and his pain.

## 2014-02-12 DIAGNOSIS — Z48812 Encounter for surgical aftercare following surgery on the circulatory system: Secondary | ICD-10-CM

## 2014-02-13 LAB — PROTIME-INR: INR: 2 — AB (ref 0.9–1.1)

## 2014-02-14 ENCOUNTER — Encounter (HOSPITAL_COMMUNITY): Payer: Self-pay | Admitting: Emergency Medicine

## 2014-02-14 ENCOUNTER — Emergency Department (HOSPITAL_COMMUNITY): Payer: BC Managed Care – PPO

## 2014-02-14 ENCOUNTER — Ambulatory Visit (INDEPENDENT_AMBULATORY_CARE_PROVIDER_SITE_OTHER): Payer: BC Managed Care – PPO | Admitting: Interventional Cardiology

## 2014-02-14 ENCOUNTER — Emergency Department (HOSPITAL_COMMUNITY)
Admission: EM | Admit: 2014-02-14 | Discharge: 2014-02-14 | Disposition: A | Discharge status: Home / Self Care | Payer: BC Managed Care – PPO | Attending: Emergency Medicine | Admitting: Emergency Medicine

## 2014-02-14 DIAGNOSIS — Z79899 Other long term (current) drug therapy: Secondary | ICD-10-CM | POA: Diagnosis not present

## 2014-02-14 DIAGNOSIS — Z7901 Long term (current) use of anticoagulants: Secondary | ICD-10-CM | POA: Diagnosis not present

## 2014-02-14 DIAGNOSIS — I10 Essential (primary) hypertension: Secondary | ICD-10-CM | POA: Insufficient documentation

## 2014-02-14 DIAGNOSIS — F419 Anxiety disorder, unspecified: Secondary | ICD-10-CM | POA: Diagnosis not present

## 2014-02-14 DIAGNOSIS — Z9889 Other specified postprocedural states: Secondary | ICD-10-CM | POA: Insufficient documentation

## 2014-02-14 DIAGNOSIS — Z954 Presence of other heart-valve replacement: Secondary | ICD-10-CM | POA: Diagnosis not present

## 2014-02-14 DIAGNOSIS — J45909 Unspecified asthma, uncomplicated: Secondary | ICD-10-CM | POA: Diagnosis not present

## 2014-02-14 DIAGNOSIS — E785 Hyperlipidemia, unspecified: Secondary | ICD-10-CM | POA: Diagnosis not present

## 2014-02-14 DIAGNOSIS — Z87891 Personal history of nicotine dependence: Secondary | ICD-10-CM | POA: Insufficient documentation

## 2014-02-14 DIAGNOSIS — F329 Major depressive disorder, single episode, unspecified: Secondary | ICD-10-CM | POA: Diagnosis not present

## 2014-02-14 DIAGNOSIS — R079 Chest pain, unspecified: Secondary | ICD-10-CM

## 2014-02-14 DIAGNOSIS — I712 Thoracic aortic aneurysm, without rupture: Secondary | ICD-10-CM | POA: Insufficient documentation

## 2014-02-14 DIAGNOSIS — R0789 Other chest pain: Secondary | ICD-10-CM | POA: Insufficient documentation

## 2014-02-14 DIAGNOSIS — R071 Chest pain on breathing: Secondary | ICD-10-CM

## 2014-02-14 LAB — BASIC METABOLIC PANEL
Anion gap: 15 (ref 5–15)
BUN: 21 mg/dL (ref 6–23)
CO2: 26 mEq/L (ref 19–32)
Calcium: 10 mg/dL (ref 8.4–10.5)
Chloride: 96 mEq/L (ref 96–112)
Creatinine, Ser: 1.48 mg/dL — ABNORMAL HIGH (ref 0.50–1.35)
GFR calc Af Amer: 59 mL/min — ABNORMAL LOW (ref >90)
GFR, EST NON AFRICAN AMERICAN: 51 mL/min — AB (ref >90)
Glucose, Bld: 166 mg/dL — ABNORMAL HIGH (ref 70–99)
POTASSIUM: 4.9 meq/L (ref 3.7–5.3)
SODIUM: 137 meq/L (ref 137–147)

## 2014-02-14 LAB — CBC
HCT: 34.1 % — ABNORMAL LOW (ref 39.0–52.0)
Hemoglobin: 11.1 g/dL — ABNORMAL LOW (ref 13.0–17.0)
MCH: 27.1 pg (ref 26.0–34.0)
MCHC: 32.6 g/dL (ref 30.0–36.0)
MCV: 83.2 fL (ref 78.0–100.0)
Platelets: 443 10*3/uL — ABNORMAL HIGH (ref 150–400)
RBC: 4.1 MIL/uL — ABNORMAL LOW (ref 4.22–5.81)
RDW: 15.3 % (ref 11.5–15.5)
WBC: 10.5 10*3/uL (ref 4.0–10.5)

## 2014-02-14 LAB — PROTIME-INR
INR: 2.03 — ABNORMAL HIGH (ref 0.00–1.49)
Prothrombin Time: 23.1 seconds — ABNORMAL HIGH (ref 11.6–15.2)

## 2014-02-14 LAB — I-STAT TROPONIN, ED: TROPONIN I, POC: 0 ng/mL (ref 0.00–0.08)

## 2014-02-14 MED ORDER — HYDROMORPHONE HCL 4 MG PO TABS: 4.0000 mg | ORAL_TABLET | ORAL | Status: DC | PRN

## 2014-02-14 MED ORDER — FENTANYL CITRATE 0.05 MG/ML IJ SOLN
100.0000 ug | Freq: Once | INTRAMUSCULAR | Status: AC
Start: 2014-02-14 — End: 2014-02-14
  Administered 2014-02-14: 100 ug via INTRAVENOUS
  Filled 2014-02-14: qty 2

## 2014-02-14 MED ORDER — IOHEXOL 350 MG/ML SOLN
80.0000 mL | Freq: Once | INTRAVENOUS | Status: AC | PRN
  Administered 2014-02-14: 80 mL via INTRAVENOUS

## 2014-02-14 MED ORDER — POLYETHYLENE GLYCOL 3350 17 G PO PACK: 17.0000 g | PACK | Freq: Two times a day (BID) | ORAL | Status: DC

## 2014-02-14 NOTE — ED Notes (Signed)
Pt states he began having R CP under R breast described as sharp/shooting on Monday.  Nothing makes it better and nothing makes it worse.  Pt saw cardiologist Monday, but it was before he began having pain.

## 2014-02-14 NOTE — ED Provider Notes (Signed)
CSN: [id number]     Arrival date & time 02/14/14  1427 History   First MD Initiated Contact with Patient 02/14/14 1512     Chief Complaint  Patient presents with  . Chest Pain     (Consider location/radiation/quality/duration/timing/severity/associated sxs/prior Treatment) HPI 58 year old male 3 weeks postoperative from aortic valve replacement on Coumadin presents with 4 days of constant sharp stabbing well localized nonradiating pleuritic right sided anterior chest pain with no shortness of breath no cough no fever no abdominal pain no other concerns. His pain is sharp stabbing severe constant well localized somewhat improved with oxycodone. Past Medical History  Diagnosis Date  . Chest pain, atypical   . Hypertension   . Hyperlipidemia   . Chronic anticoagulation   . S/P AVR     for endocarditis  . Ascending aortic aneurysm 01/07/2014  . Anxiety   . Depression   . Asthma    Past Surgical History  Procedure Laterality Date  . Aortic valve replacement  1988    23mm St. Jude valve  . Doppler echocardiography  06/12/1996    EF 55-60%  . Cardiac catheterization    . Hand surgery    . Bentall procedure N/A 01/23/2014    Procedure: BENTALL PROCEDURE;  Surgeon: Kerin Perna, MD;  Location: [facility] OR;  Service: Open Heart Surgery;  Laterality: N/A;  . Aortic valve replacement N/A 01/23/2014    Procedure: REDO AORTIC VALVE REPLACEMENT (AVR) with 23 St. Jude Mechanical Aortic Valved Graft;  Surgeon: Kerin Perna, MD;  Location: [facility] OR;  Service: Open Heart Surgery;  Laterality: N/A;  . Intraoperative transesophageal echocardiogram N/A 01/23/2014    Procedure: INTRAOPERATIVE TRANSESOPHAGEAL ECHOCARDIOGRAM;  Surgeon: Kerin Perna, MD;  Location: [facility] OR;  Service: Open Heart Surgery;  Laterality: N/A;   Family History  Problem Relation Age of Onset  . Cancer Mother   . Healthy Brother   . Healthy Brother    History  Substance Use Topics  . Smoking status: Former Smoker    Quit  date: 05/04/1986  . Smokeless tobacco: Never Used  . Alcohol Use: 1.8 oz/week    3 Shots of liquor per week     Comment: daily    Review of Systems 10 Systems reviewed and are negative for acute change except as noted in the HPI.   Allergies  Review of patient's allergies indicates no known allergies.  Home Medications   Prior to Admission medications   Medication Sig Start Date End Date Taking? Authorizing Provider  albuterol (PROVENTIL HFA;VENTOLIN HFA) 108 (90 BASE) MCG/ACT inhaler Inhale 2 puffs into the lungs every 6 (six) hours as needed for wheezing or shortness of breath.   Yes Historical Provider, MD  ALPRAZolam Prudy Feeler) 0.5 MG tablet Take 0.5 mg by mouth daily as needed for anxiety.    Yes Historical Provider, MD  amiodarone (PACERONE) 200 MG tablet Take 200 mg by mouth daily.   Yes Historical Provider, MD  diphenhydramine-acetaminophen (TYLENOL PM) 25-500 MG TABS Take 1 tablet by mouth at bedtime as needed (sleep).   Yes Historical Provider, MD  ezetimibe-simvastatin (VYTORIN) 10-20 MG per tablet Take 1 tablet by mouth at bedtime.  03/11/11  Yes Peter M [place], MD  metoprolol tartrate (LOPRESSOR) 25 MG tablet Take 12.5 mg by mouth 2 (two) times daily.   Yes Historical Provider, MD  warfarin (COUMADIN) 5 MG tablet Take 2.5-5 mg by mouth daily. Take 2.5mg  on all other days ( Mon, Tues, Wed, Fri, & Sat) Take 5mg   on Sunday & Thursday ONLY   Yes Historical Provider, MD  amoxicillin (AMOXIL) 500 MG capsule  02/18/14   Historical Provider, MD  oxyCODONE (OXY IR/ROXICODONE) 5 MG immediate release tablet  02/10/14   Historical Provider, MD  polyethylene glycol (MIRALAX / GLYCOLAX) packet Take 17 g by mouth 2 (two) times daily. 02/14/14   Hurman Horn, MD   BP 114/58 mmHg  Pulse 73  Temp(Src) 98.5 F (36.9 C) (Oral)  Resp 20  SpO2 92% Physical Exam  Nursing note and vitals reviewed. Constitutional:  Awake, alert, nontoxic appearance.  HENT:  Head: Atraumatic.  Eyes: Right  eye exhibits no discharge. Left eye exhibits no discharge.  Neck: Neck supple.  Cardiovascular: Normal rate and regular rhythm.   No murmur heard. Positive mechanical click  Pulmonary/Chest: Effort normal and breath sounds normal. No respiratory distress. He has no wheezes. He has no rales. He exhibits tenderness.  Reproducible right lower chest wall tenderness without rash noted without deformity noted his sternal incision is healing well with no dehiscence no erythema no fluctuance no purulent drainage  Abdominal: Soft. He exhibits no distension. There is no tenderness. There is no rebound and no guarding.  Musculoskeletal: He exhibits no edema and no tenderness.  Baseline ROM, no obvious new focal weakness.  Neurological:  Mental status and motor strength appears baseline for patient and situation.  Skin: No rash noted.  Psychiatric: He has a normal mood and affect.    ED Course  Procedures (including critical care time) Patient low to moderate Wells PE criteria since recent post-op d-dimer will likely be positive thus CTA ordered. Patient / Family / Caregiver informed of clinical course, understand medical decision-making process, and agree with plan. Labs Review Labs Reviewed  CBC - Abnormal; Notable for the following:    RBC 4.10 (*)    Hemoglobin 11.1 (*)    HCT 34.1 (*)    Platelets 443 (*)    All other components within normal limits  BASIC METABOLIC PANEL - Abnormal; Notable for the following:    Glucose, Bld 166 (*)    Creatinine, Ser 1.48 (*)    GFR calc non Af Amer 51 (*)    GFR calc Af Amer 59 (*)    All other components within normal limits  PROTIME-INR - Abnormal; Notable for the following:    Prothrombin Time 23.1 (*)    INR 2.03 (*)    All other components within normal limits  I-STAT TROPOININ, ED    Imaging Review Dg Chest Port 1 View  02/23/2014   CLINICAL DATA:  Postop from aortic valve replacement.  EXAM: PORTABLE CHEST - 1 VIEW  COMPARISON:   02/22/2014  FINDINGS: Mild cardiomegaly is stable. Mild atelectasis in left lung base shows no significant change. No evidence of pneumothorax or pleural effusion. Patient has undergone aortic valve replacement.  IMPRESSION: Mild left basilar atelectasis, without significant change. No pneumothorax visualized.   Electronically Signed   By: Myles Rosenthal M.D.   On: 02/23/2014 11:12   Dg Chest Port 1 View  02/22/2014   CLINICAL DATA:  Status post heart valve replacement  EXAM: PORTABLE CHEST - 1 VIEW  COMPARISON:  Radiograph 02/21/2014  FINDINGS: Sternotomy wires and prosthetic valve overlie normal cardiac silhouette. Small left effusion and basilar atelectasis is slightly increased from compared to prior. No pulmonary edema. No pneumothorax.  IMPRESSION: Mild increase in left basilar atelectasis and effusion.   Electronically Signed   By: Loura Halt.D.  On: 02/22/2014 11:06   Dg Chest 2 View  02/21/2014   CLINICAL DATA:  Aortic valve replacement  EXAM: CHEST  2 VIEW  COMPARISON:  02/14/2014  FINDINGS: Mild cardiomegaly. Aortic valve prosthetic stable. Tiny pleural effusions and bibasilar atelectasis versus airspace disease, best seen on the lateral view are all improved. Normal vascularity. No pneumothorax. Upper lungs are clear.  IMPRESSION: Minimal bibasilar atelectasis versus airspace disease and small pleural effusions have improved. No evidence of cardiac decompensation.   Electronically Signed   By: Maryclare Bean M.D.   On: 02/21/2014 13:10   Dg Chest 2 View  02/14/2014   CLINICAL DATA:  Right-sided chest pain.  EXAM: CHEST  2 VIEW  COMPARISON:  02/10/2014.  FINDINGS: Mediastinum and hilar structures are normal. Prior cardiac valve replacement. Mild cardiomegaly with normal pulmonary vascularity. Minimal patchy infiltrates in the upper lobes cannot be excluded. Follow-up chest x-ray recommended to demonstrate clearing. Small bilateral pleural effusions. No pneumothorax. No acute bony abnormality.   IMPRESSION: 1. Prior cardiac valve replacement. There is cardiomegaly. No evidence of pulmonary venous congestion. 2. Minimal patchy upper lobe pulmonary infiltrates, pneumonia cannot be excluded. 3. Tiny bilateral pleural effusions.   Electronically Signed   By: Maisie Fus  Register   On: 02/14/2014 15:14   Dg Chest 2 View  02/10/2014   CLINICAL DATA:  Post aortic valve replacement  EXAM: CHEST  2 VIEW  COMPARISON:  01/28/2014  FINDINGS: Cardiomediastinal silhouette is stable. The patient is status post median sternotomy and aortic valve replacement. No acute infiltrate or pleural effusion. No pulmonary edema. Mild left basilar atelectasis.  IMPRESSION: Status post median sternotomy and aortic valve replacement. No acute infiltrate or pulmonary edema. Mild left basilar atelectasis.   Electronically Signed   By: Natasha Mead M.D.   On: 02/10/2014 12:08   Dg Chest 2 View  01/28/2014   CLINICAL DATA:  Pleural effusions, intermittent shortness of breath, personal history of hypertension, asthma, hyperlipidemia, ascending aortic aneurysm, prior AVR, former smoker  EXAM: CHEST  2 VIEW  COMPARISON:  01/27/2014  FINDINGS: Enlargement of cardiac silhouette post median sternotomy and AVR.  Pulmonary vascular congestion.  Mediastinal contour stable.  Probable mild perihilar edema, slightly increased at RIGHT base.  Small bibasilar effusions and atelectasis.  No pneumothorax.  Skin clips projecting over RIGHT infraclavicular region.  No acute osseous findings.  IMPRESSION: Enlargement of cardiac silhouette with pulmonary vascular congestion and probable mild edema post AVR.  Bibasilar atelectasis and small pleural effusions.   Electronically Signed   By: Ulyses Southward M.D.   On: 01/28/2014 10:18   Dg Chest 2 View  01/27/2014   CLINICAL DATA:  Atypical chest pain. Patient is status post aortic valve replacement for aortic regurgitation  EXAM: CHEST  2 VIEW  COMPARISON:  January 26, 2014  FINDINGS: Temporary pacemaker wires  remain attached to the right heart. Cordis has been removed. No pneumothorax. There are small pleural effusions bilaterally with mild left base atelectatic change. Elsewhere lungs are clear. Heart is enlarged with pulmonary vascularity within normal limits. Patient is status post aortic valve replacement. Skin staples remain over the right upper hemithorax.  IMPRESSION: No pneumothorax. Small pleural effusions bilaterally with mild left base atelectasis. Mild cardiomegaly stable.   Electronically Signed   By: Bretta Bang M.D.   On: 01/27/2014 07:42   Ct Angio Chest Pe W/cm &/or Wo Cm  02/14/2014   CLINICAL DATA:  Open heart surgery 3 weeks ago. Chest pain a few days ago. Atypical  RIGHT-sided chest pain. Thoracic aneurysm.  EXAM: CT ANGIOGRAPHY CHEST WITH CONTRAST  TECHNIQUE: Multidetector CT imaging of the chest was performed using the standard protocol during bolus administration of intravenous contrast. Multiplanar CT image reconstructions and MIPs were obtained to evaluate the vascular anatomy.  CONTRAST:  80mL OMNIPAQUE IOHEXOL 350 MG/ML SOLN  COMPARISON:  None.  FINDINGS: Bones: Recent median sternotomy.  No aggressive osseous lesions.  Cardiovascular: Negative for pulmonary embolism. Technically adequate study. No acute aortic abnormality. Aortic root replacement is present. Heart is mildly enlarged.  Lungs: Dependent atelectasis.  No airspace disease.  Central airways: Patent.  Effusions: Small bilateral dependently layering pleural effusions. Small pericardial effusion is present. There is also a small retrosternal fluid collection measuring 3 cm transverse by 19 mm AP. This probably represents postoperative seroma or hematoma. This is expected following median sternotomy.  Lymphadenopathy: Mildly enlarged RIGHT hilar lymph node is probably reactive/congestive. Precarinal lymph node appears similar, measuring 11 mm short axis.  Esophagus: Normal.  Upper abdomen: Within normal limits.  Other: There  appears to be a remnant of a vascular graft in the RIGHT axilla.  Review of the MIP images confirms the above findings.  IMPRESSION: 1. Technically adequate study without pulmonary embolism. 2. Small bilateral pleural effusions are probably residual from recent operation. 3. Expected appearance of the heart and anterior mediastinum following reason median sternotomy. Aortic root replacement and aortic valve replacement.   Electronically Signed   By: Andreas NewportGeoffrey  Lamke M.D.   On: 02/14/2014 16:50   Dg Chest Port 1 View  02/23/2014   CLINICAL DATA:  Postop from aortic valve replacement.  EXAM: PORTABLE CHEST - 1 VIEW  COMPARISON:  02/22/2014  FINDINGS: Mild cardiomegaly is stable. Mild atelectasis in left lung base shows no significant change. No evidence of pneumothorax or pleural effusion. Patient has undergone aortic valve replacement.  IMPRESSION: Mild left basilar atelectasis, without significant change. No pneumothorax visualized.   Electronically Signed   By: Myles RosenthalJohn  Stahl M.D.   On: 02/23/2014 11:12   Dg Chest Port 1 View  02/22/2014   CLINICAL DATA:  Status post heart valve replacement  EXAM: PORTABLE CHEST - 1 VIEW  COMPARISON:  Radiograph 02/21/2014  FINDINGS: Sternotomy wires and prosthetic valve overlie normal cardiac silhouette. Small left effusion and basilar atelectasis is slightly increased from compared to prior. No pulmonary edema. No pneumothorax.  IMPRESSION: Mild increase in left basilar atelectasis and effusion.   Electronically Signed   By: Genevive BiStewart  Edmunds M.D.   On: 02/22/2014 11:06   Dg Chest Port 1 View  01/26/2014   CLINICAL DATA:  postop cardiac surgery AVR  EXAM: PORTABLE CHEST - 1 VIEW  COMPARISON:  the previous day's study  FINDINGS: Left chest tube and mediastinal drain have been removed. No pneumothorax. Right IJ venous sheath stable. Previous median sternotomy and AVR. Persistent patchy bibasilar atelectasis or infiltrates. Heart size upper limits normal for technique. Blunting  of the right lateral costophrenic angle suggesting small effusion. No effusion. Visualized skeletal structures are unremarkable.  IMPRESSION: 1. Removal of left chest tube and mediastinal drain with no pneumothorax. 2. Possible small right pleural effusion   Electronically Signed   By: Oley Balmaniel  Hassell M.D.   On: 01/26/2014 08:38   Dg Chest Port 1 View  01/25/2014   CLINICAL DATA:  Atelectasis. Left-sided chest tube. Hypertension. Asthma. Ascending aortic aneurysm.  EXAM: PORTABLE CHEST - 1 VIEW  COMPARISON:  1 day prior  FINDINGS: Swan-Ganz catheter removed with right IJ Cordis sheath remaining.  Extubation. Removal of nasogastric tube. Bilateral chest tubes remain in place. Removal of mediastinal drain.  Cardiomegaly accentuated by AP portable technique. Possible small left pleural effusion, similar. No pneumothorax. Low lung volumes with resultant pulmonary interstitial prominence. Similar left and slight increase in right base atelectasis.  IMPRESSION: Extubation with slight decreased lung volumes and worsened right base aeration. Left base atelectasis remains.  Bilateral chest tubes remain in place, without pneumothorax.   Electronically Signed   By: Jeronimo GreavesKyle  Talbot M.D.   On: 01/25/2014 10:00    EKG Interpretation   Date/Time:  Friday February 14 2014 14:36:00 EDT Ventricular Rate:  84 PR Interval:  176 QRS Duration: 84 QT Interval:  404 QTC Calculation: 477 R Axis:   88 Text Interpretation:  Normal sinus rhythm Septal infarct , age  undetermined Abnormal ECG ED PHYSICIAN INTERPRETATION AVAILABLE IN [facility] Confirmed by TEST, Record ([id number]) on 02/16/2014 12:28:23 PM      MDM   Final diagnoses:  Chest pain on breathing  Chest wall pain    I doubt any other EMC precluding discharge at this time including, but not necessarily limited to the following:PE.    Hurman HornJohn M Mahaila Tischer, MD 02/23/14 [phone]

## 2014-02-14 NOTE — Discharge Instructions (Signed)
You have been diagnosed by your caregiver as having chest wall pain. °SEEK IMMEDIATE MEDICAL ATTENTION IF: °You develop a fever.  °Your chest pains become severe or intolerable.  °You develop new, unexplained symptoms (problems).  °You develop shortness of breath, nausea, vomiting, sweating or feel light headed.  °You develop a new cough or you cough up blood. ° °

## 2014-02-19 ENCOUNTER — Other Ambulatory Visit: Payer: Self-pay | Admitting: Cardiothoracic Surgery

## 2014-02-19 ENCOUNTER — Encounter: Payer: Self-pay | Admitting: Cardiothoracic Surgery

## 2014-02-19 ENCOUNTER — Ambulatory Visit (INDEPENDENT_AMBULATORY_CARE_PROVIDER_SITE_OTHER): Payer: Self-pay | Admitting: Cardiothoracic Surgery

## 2014-02-19 VITALS — BP 117/71 | HR 81 | Temp 100.6°F | Resp 16 | Ht 70.0 in | Wt 188.0 lb

## 2014-02-19 DIAGNOSIS — I712 Thoracic aortic aneurysm, without rupture: Secondary | ICD-10-CM

## 2014-02-19 DIAGNOSIS — I7121 Aneurysm of the ascending aorta, without rupture: Secondary | ICD-10-CM

## 2014-02-19 NOTE — Progress Notes (Signed)
PCP is Lupe CarneyMITCHELL,DEAN, MD Referring Provider is [place]azilandJordan, Normand Damron M, MD  Chief Complaint  Patient presents with  . OFFICE VISIT    REOCCURING PAIN SINCE SURGERY    HPI: 4 week followup after review aVR-Bentall procedure with a mechanical aortic valve-conduit. Patient has been having pain in his chest and right side after undergoing spinal manipulation a chiropractor shortly after discharge from the hospital. His chest was hyper- extended at the chiropractor intervention. He also has some swelling of the sternal incision without drainage. He is been to the emergency department for extra pain medication. CTA at that time was performed to rule out PE. The aortic repair is intact. Lungs are clear with out pleural effusion on CT scan.  He complains chest pain is nervelike with sharp shooting pains extending under his right breast line. Past Medical History  Diagnosis Date  . Chest pain, atypical   . Hypertension   . Hyperlipidemia   . Chronic anticoagulation   . S/P AVR     for endocarditis  . Ascending aortic aneurysm 01/07/2014  . Anxiety   . Depression   . Asthma     Past Surgical History  Procedure Laterality Date  . Aortic valve replacement  1988    23mm St. Jude valve  . Doppler echocardiography  06/12/1996    EF 55-60%  . Cardiac catheterization    . Hand surgery    . Bentall procedure N/A 01/23/2014    Procedure: BENTALL PROCEDURE;  Surgeon: Kerin PernaPeter Van Trigt, MD;  [facility];  Service: Open Heart Surgery;  Laterality: N/A;  . Aortic valve replacement N/A 01/23/2014    Procedure: REDO AORTIC VALVE REPLACEMENT (AVR) with 23 St. Jude Mechanical Aortic Valved Graft;  Surgeon: Kerin PernaPeter Van Trigt, MD;  [facility];  Service: Open Heart Surgery;  Laterality: N/A;  . Intraoperative transesophageal echocardiogram N/A 01/23/2014    Procedure: INTRAOPERATIVE TRANSESOPHAGEAL ECHOCARDIOGRAM;  Surgeon: Kerin PernaPeter Van Trigt, MD;  [facility];  Service: Open Heart Surgery;  Laterality: N/A;     Family History  Problem Relation Age of Onset  . Cancer Mother   . Healthy Brother   . Healthy Brother     Social History History  Substance Use Topics  . Smoking status: Former Smoker    Quit date: 05/04/1986  . Smokeless tobacco: Not on file  . Alcohol Use: 1.8 oz/week    3 Shots of liquor per week     Comment: daily    Current Outpatient Prescriptions  Medication Sig Dispense Refill  . ALPRAZolam (XANAX) 0.5 MG tablet Take 0.5 mg by mouth daily as needed for anxiety.       Marland Kitchen. amiodarone (PACERONE) 200 MG tablet Take 200 mg by mouth daily.      . diphenhydramine-acetaminophen (TYLENOL PM) 25-500 MG TABS Take 1 tablet by mouth at bedtime as needed (sleep).      . ezetimibe-simvastatin (VYTORIN) 10-20 MG per tablet Take 1 tablet by mouth at bedtime.       . metoprolol tartrate (LOPRESSOR) 25 MG tablet Take 12.5 mg by mouth 2 (two) times daily.      . polyethylene glycol (MIRALAX / GLYCOLAX) packet Take 17 g by mouth 2 (two) times daily.  24 each  0  . warfarin (COUMADIN) 5 MG tablet Take 2.5-5 mg by mouth daily. Take 2.5mg  on all other days ( Mon, Tues, Wed, Fri, & Sat) Take 5mg  on Sunday & Thursday ONLY      . albuterol (PROVENTIL HFA;VENTOLIN HFA)  108 (90 BASE) MCG/ACT inhaler Inhale 2 puffs into the lungs every 6 (six) hours as needed for wheezing or shortness of breath.       No current facility-administered medications for this visit.    No Known Allergies  Review of Systems denies fever but Today temperature is 100.6   no symptoms of CHF. Patient due to check his INR in 2 days.  BP 117/71  Pulse 81  Temp(Src) 100.6 F (38.1 C) (Oral)  Resp 16  Ht 5\' 10"  (1.778 m)  Wt 188 lb (85.276 kg)  BMI 26.98 kg/m2  SpO2 96% Physical Exam Alert and appropriate Lungs clear Heart rhythm regular Sharp closure sound of mechanical aortic valve Sternal incision with area of fascial disruption and fluid collection along the manubrium. This was aspirated under sterile prep  with a needle of 3-4 cc of fluid which appears to be fat necrosis-cultures are taken. No peripheral edema  Diagnostic Tests: CT scan from ED and reviewed showing intact aortic repair  Impression: Sternal and right pain following chiropractic manipulation spine. Possible superficial sternal wound infection-results of wound aspirate pending  Plan: Keflex started 500 mg by mouth 3 times a day Neurontin 300 mg by mouth twice a day for neuritic-type pain Refill for prescription oxycodone 10 mg-325 one by mouth 3 times a day as needed for pain Return for wound check in 48 hours and to review results of wound aspirate culture

## 2014-02-21 ENCOUNTER — Ambulatory Visit (INDEPENDENT_AMBULATORY_CARE_PROVIDER_SITE_OTHER): Payer: Self-pay | Admitting: Cardiothoracic Surgery

## 2014-02-21 ENCOUNTER — Encounter: Payer: Self-pay | Admitting: Cardiothoracic Surgery

## 2014-02-21 ENCOUNTER — Ambulatory Visit
Admission: RE | Admit: 2014-02-21 | Discharge: 2014-02-21 | Disposition: A | Discharge status: Home / Self Care | Payer: BC Managed Care – PPO | Source: Ambulatory Visit | Attending: Cardiothoracic Surgery | Admitting: Cardiothoracic Surgery

## 2014-02-21 ENCOUNTER — Other Ambulatory Visit: Payer: Self-pay | Admitting: Cardiothoracic Surgery

## 2014-02-21 ENCOUNTER — Inpatient Hospital Stay (HOSPITAL_COMMUNITY)
Admission: AD | Admit: 2014-02-21 | Discharge: 2014-03-05 | DRG: 857 | Disposition: A | Discharge status: Home Health | Payer: BC Managed Care – PPO | Source: Ambulatory Visit | Attending: Cardiothoracic Surgery | Admitting: Cardiothoracic Surgery

## 2014-02-21 ENCOUNTER — Encounter (HOSPITAL_COMMUNITY): Payer: Self-pay | Admitting: General Practice

## 2014-02-21 VITALS — BP 132/80 | HR 86 | Temp 99.0°F | Resp 19 | Ht 70.0 in | Wt 184.0 lb

## 2014-02-21 DIAGNOSIS — T814XXA Infection following a procedure, initial encounter: Principal | ICD-10-CM | POA: Diagnosis present

## 2014-02-21 DIAGNOSIS — Z7901 Long term (current) use of anticoagulants: Secondary | ICD-10-CM

## 2014-02-21 DIAGNOSIS — K5900 Constipation, unspecified: Secondary | ICD-10-CM | POA: Diagnosis not present

## 2014-02-21 DIAGNOSIS — Z87891 Personal history of nicotine dependence: Secondary | ICD-10-CM

## 2014-02-21 DIAGNOSIS — Y831 Surgical operation with implant of artificial internal device as the cause of abnormal reaction of the patient, or of later complication, without mention of misadventure at the time of the procedure: Secondary | ICD-10-CM | POA: Diagnosis present

## 2014-02-21 DIAGNOSIS — IMO0001 Reserved for inherently not codable concepts without codable children: Secondary | ICD-10-CM

## 2014-02-21 DIAGNOSIS — T368X5A Adverse effect of other systemic antibiotics, initial encounter: Secondary | ICD-10-CM | POA: Diagnosis not present

## 2014-02-21 DIAGNOSIS — E785 Hyperlipidemia, unspecified: Secondary | ICD-10-CM | POA: Diagnosis not present

## 2014-02-21 DIAGNOSIS — I4891 Unspecified atrial fibrillation: Secondary | ICD-10-CM | POA: Diagnosis present

## 2014-02-21 DIAGNOSIS — N289 Disorder of kidney and ureter, unspecified: Secondary | ICD-10-CM | POA: Diagnosis not present

## 2014-02-21 DIAGNOSIS — Y92230 Patient room in hospital as the place of occurrence of the external cause: Secondary | ICD-10-CM

## 2014-02-21 DIAGNOSIS — Z7982 Long term (current) use of aspirin: Secondary | ICD-10-CM | POA: Diagnosis not present

## 2014-02-21 DIAGNOSIS — F1290 Cannabis use, unspecified, uncomplicated: Secondary | ICD-10-CM | POA: Diagnosis present

## 2014-02-21 DIAGNOSIS — I712 Thoracic aortic aneurysm, without rupture: Secondary | ICD-10-CM

## 2014-02-21 DIAGNOSIS — I350 Nonrheumatic aortic (valve) stenosis: Secondary | ICD-10-CM

## 2014-02-21 DIAGNOSIS — F419 Anxiety disorder, unspecified: Secondary | ICD-10-CM | POA: Diagnosis present

## 2014-02-21 DIAGNOSIS — I7121 Aneurysm of the ascending aorta, without rupture: Secondary | ICD-10-CM

## 2014-02-21 DIAGNOSIS — F329 Major depressive disorder, single episode, unspecified: Secondary | ICD-10-CM | POA: Diagnosis present

## 2014-02-21 DIAGNOSIS — J45909 Unspecified asthma, uncomplicated: Secondary | ICD-10-CM | POA: Diagnosis present

## 2014-02-21 DIAGNOSIS — Z952 Presence of prosthetic heart valve: Secondary | ICD-10-CM

## 2014-02-21 DIAGNOSIS — Z5189 Encounter for other specified aftercare: Secondary | ICD-10-CM

## 2014-02-21 DIAGNOSIS — D62 Acute posthemorrhagic anemia: Secondary | ICD-10-CM | POA: Diagnosis not present

## 2014-02-21 DIAGNOSIS — I10 Essential (primary) hypertension: Secondary | ICD-10-CM | POA: Diagnosis present

## 2014-02-21 DIAGNOSIS — T8149XA Infection following a procedure, other surgical site, initial encounter: Secondary | ICD-10-CM | POA: Diagnosis present

## 2014-02-21 LAB — COMPREHENSIVE METABOLIC PANEL
ALK PHOS: 73 U/L (ref 39–117)
ALT: 38 U/L (ref 0–53)
ALT: 38 U/L (ref 0–53)
AST: 25 U/L (ref 0–37)
AST: 25 U/L (ref 0–37)
Albumin: 3.1 g/dL — ABNORMAL LOW (ref 3.5–5.2)
Albumin: 3.1 g/dL — ABNORMAL LOW (ref 3.5–5.2)
Alkaline Phosphatase: 78 U/L (ref 39–117)
Anion gap: 13 (ref 5–15)
Anion gap: 13 (ref 5–15)
BUN: 17 mg/dL (ref 6–23)
BUN: 18 mg/dL (ref 6–23)
CO2: 28 mEq/L (ref 19–32)
CO2: 28 mEq/L (ref 19–32)
Calcium: 9.8 mg/dL (ref 8.4–10.5)
Calcium: 9.6 mg/dL (ref 8.4–10.5)
Chloride: 99 mEq/L (ref 96–112)
Chloride: 98 mEq/L (ref 96–112)
Creatinine, Ser: 1.04 mg/dL (ref 0.50–1.35)
Creatinine, Ser: 1.16 mg/dL (ref 0.50–1.35)
GFR calc Af Amer: 79 mL/min — ABNORMAL LOW (ref >90)
GFR calc non Af Amer: 78 mL/min — ABNORMAL LOW (ref >90)
GFR calc non Af Amer: 68 mL/min — ABNORMAL LOW (ref >90)
GLUCOSE: 119 mg/dL — AB (ref 70–99)
Glucose, Bld: 101 mg/dL — ABNORMAL HIGH (ref 70–99)
POTASSIUM: 4.5 meq/L (ref 3.7–5.3)
Potassium: 4.3 mEq/L (ref 3.7–5.3)
Sodium: 140 mEq/L (ref 137–147)
Sodium: 139 mEq/L (ref 137–147)
TOTAL PROTEIN: 7.2 g/dL (ref 6.0–8.3)
Total Bilirubin: <0.2 mg/dL — ABNORMAL LOW (ref 0.3–1.2)
Total Bilirubin: <0.2 mg/dL — ABNORMAL LOW (ref 0.3–1.2)
Total Protein: 7.1 g/dL (ref 6.0–8.3)

## 2014-02-21 LAB — PROTIME-INR
INR: 2.68 — ABNORMAL HIGH (ref 0.00–1.49)
INR: 2.69 — ABNORMAL HIGH (ref 0.00–1.49)
PROTHROMBIN TIME: 28.7 s — AB (ref 11.6–15.2)
Prothrombin Time: 28.8 seconds — ABNORMAL HIGH (ref 11.6–15.2)

## 2014-02-21 LAB — BLOOD GAS, ARTERIAL
Acid-Base Excess: 2.9 mmol/L — ABNORMAL HIGH (ref 0.0–2.0)
Bicarbonate: 26.3 mEq/L — ABNORMAL HIGH (ref 20.0–24.0)
Drawn by: 246861
FIO2: 0.21 %
O2 Saturation: 94 %
Patient temperature: 98.6
TCO2: 27.5 mmol/L (ref 0–100)
pCO2 arterial: 36.4 mmHg (ref 35.0–45.0)
pH, Arterial: 7.473 — ABNORMAL HIGH (ref 7.350–7.450)
pO2, Arterial: 65.9 mmHg — ABNORMAL LOW (ref 80.0–100.0)

## 2014-02-21 LAB — URINALYSIS, ROUTINE W REFLEX MICROSCOPIC
BILIRUBIN URINE: NEGATIVE
GLUCOSE, UA: NEGATIVE mg/dL
HGB URINE DIPSTICK: NEGATIVE
Ketones, ur: NEGATIVE mg/dL
Leukocytes, UA: NEGATIVE
Nitrite: NEGATIVE
PH: 7 (ref 5.0–8.0)
Protein, ur: NEGATIVE mg/dL
SPECIFIC GRAVITY, URINE: 1.018 (ref 1.005–1.030)
UROBILINOGEN UA: 0.2 mg/dL (ref 0.0–1.0)

## 2014-02-21 LAB — CBC
HCT: 33.8 % — ABNORMAL LOW (ref 39.0–52.0)
HCT: 34.3 % — ABNORMAL LOW (ref 39.0–52.0)
HEMOGLOBIN: 10.9 g/dL — AB (ref 13.0–17.0)
Hemoglobin: 11 g/dL — ABNORMAL LOW (ref 13.0–17.0)
MCH: 26.5 pg (ref 26.0–34.0)
MCH: 26.4 pg (ref 26.0–34.0)
MCHC: 32.2 g/dL (ref 30.0–36.0)
MCHC: 32.1 g/dL (ref 30.0–36.0)
MCV: 82 fL (ref 78.0–100.0)
MCV: 82.5 fL (ref 78.0–100.0)
PLATELETS: 301 10*3/uL (ref 150–400)
Platelets: 295 10*3/uL (ref 150–400)
RBC: 4.12 MIL/uL — AB (ref 4.22–5.81)
RBC: 4.16 MIL/uL — ABNORMAL LOW (ref 4.22–5.81)
RDW: 15.7 % — ABNORMAL HIGH (ref 11.5–15.5)
RDW: 15.7 % — ABNORMAL HIGH (ref 11.5–15.5)
WBC: 12.2 10*3/uL — ABNORMAL HIGH (ref 4.0–10.5)
WBC: 13.3 10*3/uL — ABNORMAL HIGH (ref 4.0–10.5)

## 2014-02-21 LAB — SURGICAL PCR SCREEN
MRSA, PCR: NEGATIVE
Staphylococcus aureus: NEGATIVE

## 2014-02-21 LAB — PREPARE RBC (CROSSMATCH)

## 2014-02-21 LAB — APTT: aPTT: 59 seconds — ABNORMAL HIGH (ref 24–37)

## 2014-02-21 MED ORDER — PIPERACILLIN-TAZOBACTAM 3.375 G IVPB: 3.3750 g | Freq: Four times a day (QID) | INTRAVENOUS | Status: DC

## 2014-02-21 MED ORDER — SODIUM CHLORIDE 0.9 % IV SOLN
INTRAVENOUS | Status: DC
  Administered 2014-02-21: via INTRAVENOUS

## 2014-02-21 MED ORDER — ALBUTEROL SULFATE HFA 108 (90 BASE) MCG/ACT IN AERS: 2.0000 | INHALATION_SPRAY | Freq: Four times a day (QID) | RESPIRATORY_TRACT | Status: DC | PRN

## 2014-02-21 MED ORDER — SODIUM CHLORIDE 0.9 % IJ SOLN
3.0000 mL | Freq: Two times a day (BID) | INTRAMUSCULAR | Status: DC
  Administered 2014-02-21 – 2014-02-23 (×3): 3 mL via INTRAVENOUS

## 2014-02-21 MED ORDER — VITAMIN K1 10 MG/ML IJ SOLN
2.0000 mg | Freq: Once | INTRAVENOUS | Status: AC
  Administered 2014-02-21: 2 mg via INTRAVENOUS
  Filled 2014-02-21: qty 0.2

## 2014-02-21 MED ORDER — ONDANSETRON HCL 4 MG/2ML IJ SOLN: 4.0000 mg | Freq: Four times a day (QID) | INTRAMUSCULAR | Status: DC | PRN

## 2014-02-21 MED ORDER — AMIODARONE HCL 200 MG PO TABS
200.0000 mg | ORAL_TABLET | Freq: Every day | ORAL | Status: DC
  Administered 2014-02-23 – 2014-03-05 (×11): 200 mg via ORAL
  Filled 2014-02-21 (×12): qty 1

## 2014-02-21 MED ORDER — ALPRAZOLAM 0.5 MG PO TABS
0.5000 mg | ORAL_TABLET | Freq: Every day | ORAL | Status: DC | PRN
  Administered 2014-02-21 – 2014-03-04 (×12): 0.5 mg via ORAL
  Filled 2014-02-21 (×12): qty 1

## 2014-02-21 MED ORDER — ALBUTEROL SULFATE (2.5 MG/3ML) 0.083% IN NEBU: 2.5000 mg | INHALATION_SOLUTION | Freq: Four times a day (QID) | RESPIRATORY_TRACT | Status: DC | PRN

## 2014-02-21 MED ORDER — PIPERACILLIN-TAZOBACTAM 3.375 G IVPB
3.3750 g | Freq: Three times a day (TID) | INTRAVENOUS | Status: DC
  Administered 2014-02-22 – 2014-03-05 (×35): 3.375 g via INTRAVENOUS
  Filled 2014-02-21 (×39): qty 50

## 2014-02-21 MED ORDER — METOPROLOL TARTRATE 12.5 MG HALF TABLET
12.5000 mg | ORAL_TABLET | Freq: Two times a day (BID) | ORAL | Status: DC
  Administered 2014-02-21 – 2014-03-05 (×22): 12.5 mg via ORAL
  Filled 2014-02-21 (×25): qty 1

## 2014-02-21 MED ORDER — SODIUM CHLORIDE 0.9 % IJ SOLN: 3.0000 mL | Freq: Two times a day (BID) | INTRAMUSCULAR | Status: DC

## 2014-02-21 MED ORDER — ONDANSETRON HCL 4 MG PO TABS: 4.0000 mg | ORAL_TABLET | Freq: Four times a day (QID) | ORAL | Status: DC | PRN

## 2014-02-21 MED ORDER — DEXTROSE 5 % IV SOLN
1.5000 g | INTRAVENOUS | Status: AC
  Administered 2014-02-22: 1.5 g via INTRAVENOUS
  Filled 2014-02-21 (×2): qty 1.5

## 2014-02-21 MED ORDER — ACETAMINOPHEN 325 MG PO TABS
650.0000 mg | ORAL_TABLET | Freq: Four times a day (QID) | ORAL | Status: DC | PRN
  Administered 2014-02-21 – 2014-02-27 (×3): 650 mg via ORAL
  Administered 2014-03-01: 325 mg via ORAL
  Administered 2014-03-02 – 2014-03-03 (×2): 650 mg via ORAL
  Filled 2014-02-21 (×6): qty 2

## 2014-02-21 MED ORDER — MUPIROCIN CALCIUM 2 % EX CREA
TOPICAL_CREAM | Freq: Two times a day (BID) | CUTANEOUS | Status: AC
  Administered 2014-02-21 – 2014-02-23 (×2): via TOPICAL
  Administered 2014-02-23: 1 via TOPICAL
  Administered 2014-02-23: 22:00:00 via TOPICAL
  Administered 2014-02-24: 1 via TOPICAL
  Administered 2014-02-24 – 2014-02-25 (×3): via TOPICAL
  Filled 2014-02-21 (×2): qty 15

## 2014-02-21 MED ORDER — OXYCODONE HCL 5 MG PO TABS
5.0000 mg | ORAL_TABLET | ORAL | Status: DC | PRN
  Administered 2014-02-21: 5 mg via ORAL
  Filled 2014-02-21: qty 1

## 2014-02-21 MED ORDER — VANCOMYCIN HCL IN DEXTROSE 1-5 GM/200ML-% IV SOLN
1000.0000 mg | Freq: Two times a day (BID) | INTRAVENOUS | Status: DC
  Administered 2014-02-21 – 2014-02-23 (×4): 1000 mg via INTRAVENOUS
  Filled 2014-02-21 (×7): qty 200

## 2014-02-21 MED ORDER — ASPIRIN EC 81 MG PO TBEC
81.0000 mg | DELAYED_RELEASE_TABLET | Freq: Every day | ORAL | Status: DC
  Filled 2014-02-21: qty 1

## 2014-02-21 MED ORDER — ASPIRIN EC 81 MG PO TBEC
81.0000 mg | DELAYED_RELEASE_TABLET | Freq: Every day | ORAL | Status: DC
  Administered 2014-02-23 – 2014-03-05 (×11): 81 mg via ORAL
  Filled 2014-02-21 (×12): qty 1

## 2014-02-21 MED ORDER — POLYETHYLENE GLYCOL 3350 17 G PO PACK
17.0000 g | PACK | Freq: Every day | ORAL | Status: DC
  Administered 2014-02-21 – 2014-02-27 (×5): 17 g via ORAL
  Filled 2014-02-21 (×8): qty 1

## 2014-02-21 MED ORDER — SODIUM CHLORIDE 0.9 % IJ SOLN
3.0000 mL | INTRAMUSCULAR | Status: DC | PRN
  Administered 2014-02-23: 3 mL via INTRAVENOUS
  Filled 2014-02-21: qty 3

## 2014-02-21 MED ORDER — SODIUM CHLORIDE 0.9 % IV SOLN: 250.0000 mL | INTRAVENOUS | Status: DC | PRN

## 2014-02-21 MED ORDER — EZETIMIBE-SIMVASTATIN 10-20 MG PO TABS
1.0000 | ORAL_TABLET | Freq: Every day | ORAL | Status: DC
  Administered 2014-02-21 – 2014-03-04 (×12): 1 via ORAL
  Filled 2014-02-21 (×15): qty 1

## 2014-02-21 NOTE — H&P (Signed)
[address]            [place] [id number]          [phone]     [name] [name] is an 58 y.o. male. Jun 16, 1955 [id number]   Chief Complaint: Superficial sternal wound infection  History of Presenting Illness:  This is a 58 year old Caucasian male status post redo Bentall procedure (with a mechanical AVR-conduit). He initially presented to the office to see Dr. Donata ClayVan Trigt on 02/19/2014 for follow up regarding sternal pain. According to the patient, he had been having sternal pain and along the right side of his chest since undergoing chiropractic manipulation shortly after [facility]. He also had complaints of swelling of the sternal incision but denied any drainage. He had been to the emergency room on 02/14/2014. A CTA of the chest was done. It showed no PE, the arotic repair to be intact, small bilateral pleural effusions, and a small retrosternal fluid collection (seroma vs hematoma). The sternal wound was aspirated and he was placed on Keflex 500 mg by mouth tid as well as Neurontin 300 mg by mouth bid (for neuropathic pain). He was also given a refill for Oxycodone to PRN for pain. He then returned to the office today 02/21/2014 for a follow up and wound check. The patient had some subcutaneous fluid aspirated from the upper sternal incision 48 hours ago. Aspirate showed no bacteria but with WBCs present. No growth to present.  The upper sternal incision is now red and tender and starting to drain spontaneously. The sternum is stable. Chest x-ray is clear. Sternal wires are intact. Temperature is 99.0 degrees. Patient was MRSA positive for nasal swab pre op. Under sterile prep and local 1% lidocaine the upper portion of the sternal wound was incised and drained of. Material which was recultured. The wound was packed with saline wet-to-dry dressings. A smaller incision and drainage was performed more inferiorly and packed with a 2 x  2 gauze. He was instructed that [facility] would be [facility] for [facility] antibiotics, as well as I&D of the sternal wound with wound vac placement in the OR on 02/22/2014.  Past Medical History: Past Medical History  Diagnosis Date  . Chest pain, atypical   . Hypertension   . Hyperlipidemia   . Chronic anticoagulation   . S/P AVR     for endocarditis  . Ascending aortic aneurysm 01/07/2014  . Anxiety   . Depression   . Asthma     Past Surgical History: Past Surgical History  Procedure Laterality Date  . Aortic valve replacement  1988    23mm St. Jude valve  . Doppler echocardiography  06/12/1996    EF 55-60%  . Cardiac catheterization    . Hand surgery    . Bentall procedure N/A 01/23/2014    Procedure: BENTALL PROCEDURE;  Surgeon: Kerin PernaPeter Van Trigt, MD;  [facility];  Service: Open Heart Surgery;  Laterality: N/A;  . Aortic valve replacement N/A 01/23/2014    Procedure: REDO AORTIC VALVE REPLACEMENT (AVR) with 23 St. Jude Mechanical Aortic Valved Graft;  Surgeon: Kerin PernaPeter Van Trigt, MD;  [facility];  Service: Open Heart Surgery;  Laterality: N/A;  . Intraoperative transesophageal echocardiogram N/A 01/23/2014    Procedure: INTRAOPERATIVE TRANSESOPHAGEAL ECHOCARDIOGRAM;  Surgeon: Kerin PernaPeter Van Trigt, MD;  [facility];  Service: Open Heart Surgery;  Laterality: N/A;    Family History: Family History  Problem Relation Age of Onset  . Cancer Mother   . Healthy Brother   . Healthy Brother      Social History:   reports that he quit smoking about 27 years ago. He does not have any smokeless tobacco history on file. He reports that he drinks about 1.8 ounces of alcohol per week. He reports that he uses illicit drugs (Marijuana).  Allergies:  No Known Allergies  Medications Prior to Admission: Albuterol (PROVENTIL HFA;VENTOLIN HFA) 108 (90 BASE) MCG/ACT inhaler  Inhale 2 puffs into the lungs every 6 (six) hours as needed for wheezing or shortness of breath. ALPRAZolam  (XANAX) 0.5 MG tablet  Take 0.5 mg by mouth daily as needed for anxiety. Amiodarone (PACERONE) 200 MG tablet  Take 200 mg by mouth daily.  Enteric coated aspirin 81 mg Take 1 (one) table daily  diphenhydramine-acetaminophen (TYLENOL PM) 25-500 MG TABS  Take 1 tablet by mouth at bedtime as needed (sleep). ezetimibe-simvastatin (VYTORIN) 10-20 MG per tablet  Take 1 tablet by mouth at bedtime.  Keflex 500 mg Take 1 tablet by mouth 3 (three) times daily Metoprolol tartrate (LOPRESSOR) 25 MG tablet  Take 12.5 mg by mouth 2 (two) times daily.  Neurontin 300 mg Take 1 tablet by mouth 2 (two) times daily OxyCODONE (OXY IR/ROXICODONE) 5 MG immediate release tablet  Polyethylene glycol (MIRALAX / GLYCOLAX) packet  Take 17 g by mouth 2 (two) times daily.  Warfarin (COUMADIN) 5 MG tablet Take 2.5-5 mg by mouth daily. Take 2.5mg  on all other days ( Mon, Tues, Wed, Fri, & Sat)  Take 5mg  on Sunday & Thursday ONLY    Review of Systems:  Cardiac Review of Systems: Y or N  Chest Pain (incisional) [ Y ] Resting SOB [ N ] Exertional SOB [ N ] Orthopnea [ N]  Pedal Edema Klaus.Mock[N ] Palpitations Klaus.Mock[N ] Syncope [ N] Presyncope [ N ]  General Review of Systems: [Y] = yes [ ] =no  Constitional: anorexia Klaus.Mock[N ]; nausea Klaus.Mock[N ]; night sweats [ N]; fever [ N]; or chills [ N];  Eye : blurred vision Klaus.Mock[N ]; diplopia Klaus.Mock[N ]; vision changes Klaus.Mock[N ]; Amaurosis fugax[N ];  Resp: cough [ N]; wheezing[N ]; hemoptysis[N ] GI: vomiting[N ]; dysphagia[N ]; melena[N ]; hematochezia Klaus.Mock[N ]; heartburn[N ] GU: hematuria[N ]; dysuria Klaus.Mock[N ] Skin: rash [N], swelling[Y ];, hair loss[N ]; peripheral edema[N ]; or itching[ N];  Musculosketetal: myalgias[N ]; joint swelling[ N]; Heme/Lymph: bruising[ N]; bleeding[N ]; anemia[ ] ;  Neuro: TIA[ N]; headaches[N ]; stroke[N ]; vertigo[N ]; seizures[N ]; paresthesias[ ] ; difficulty walking[N ];  Psych:depression[N ] Endocrine: Pre-diabetes[Y ]; thyroid dysfunction[ N]   Vital Signs: Temp 99 (37.2) HR 86  RR 19 BP 132/80  Physical Exam: General appearance: alert, cooperative and no distress Heart: RRR, systolic flow murmur Lungs: clear to auscultation bilaterally Abdomen: soft, non-tender; bowel sounds normal; no masses,  no organomegaly Extremities: extremities normal, atraumatic, no cyanosis or edema Wound: Portion of the sternal wound previously opened. Packing in place. Axillary wound is clean, dry, well healed and no signs of infection. HEENT: Head-atraumatic Eyes-EOMI, PERRLA, sclera are non icteric Neci-supple,no JVD   Diagnostic Studies and Laboratory Results: No results found for this or any previous visit (from the past 48 hour(s)). Dg Chest 2 View  02/21/2014   CLINICAL DATA:  Aortic valve replacement  EXAM: CHEST  2 VIEW  COMPARISON:  02/14/2014  FINDINGS: Mild cardiomegaly. Aortic  valve prosthetic stable. Tiny pleural effusions and bibasilar atelectasis versus airspace disease, best seen on the lateral view are all improved. Normal vascularity. No pneumothorax. Upper lungs are clear.  IMPRESSION: Minimal bibasilar atelectasis versus airspace disease and small pleural effusions have improved. No evidence of cardiac decompensation.   Electronically Signed   By: Maryclare Bean M.D.   On: 02/21/2014 13:10     Assessment/Plan 1. Superficial sternal wound infection, s/p I and D at office. He is being admitted so that he can undergo an operative incision and debridement and wound VAC placement. This will be done on 02/22/2014 by Dr. Donata Clay. Will start IV Vancomycin.  2. S/P redo sternotomy for AVR (St. Jude mechanical valve) By Dr. Donata Clay on 01/23/2014. Check INR as on Coumadin prior to admission. NO coumadin tonight. Check INR.  Ardelle Balls, PA-C 02/21/2014, 4:52 PM     patient examined and medical record reviewed,agree with above note Will reverse elevated INR preop. VAN TRIGT III,[name] [name] 02/21/2014

## 2014-02-21 NOTE — Addendum Note (Signed)
Addended by: Burgess EstelleVAN TRIGT III MD, PETER on: 02/21/2014 02:18 PM   Modules accepted: Orders

## 2014-02-21 NOTE — Progress Notes (Signed)
ANTIBIOTIC CONSULT NOTE - INITIAL  Pharmacy Consult for Vancomycin  Indication: Superficial sternal wound  No Known Allergies  Patient Measurements: Height: 5\' 10"  (177.8 cm) Weight: 185 lb 14.4 oz (84.324 kg) IBW/kg (Calculated) : 73  Vital Signs: Temp: 101.2 F (38.4 C) (10/30 2144) Temp Source: Oral (10/30 2144) BP: 122/71 mmHg (10/30 2144) Pulse Rate: 94 (10/30 2144)  Labs:  Recent Labs  02/21/14 1700 02/21/14 1953  WBC 12.2* 13.3*  HGB 10.9* 11.0*  PLT 301 295  CREATININE 1.04 1.16    Microbiology: Recent Results (from the past 720 hour(s))  CULTURE, ROUTINE-ABSCESS     Status: None   Collection Time    02/19/14  4:15 PM      Result Value Ref Range Status   Gram Stain Abundant   Preliminary   Gram Stain WBC present-predominately PMN   Preliminary   Gram Stain No Squamous Epithelial Cells Seen   Preliminary   Gram Stain No Organisms Seen   Preliminary   Preliminary Report NO GROWTH 1 DAY   Preliminary  SURGICAL PCR SCREEN     Status: None   Collection Time    02/21/14  9:47 PM      Result Value Ref Range Status   MRSA, PCR NEGATIVE  NEGATIVE Final   Staphylococcus aureus NEGATIVE  NEGATIVE Final   Comment:            The Xpert SA Assay (FDA     approved for NASAL specimens     in patients over 921 years of age),     is one component of     a comprehensive surveillance     program.  Test performance has     been validated by [facility] for patients greater     than or equal to 639 year old.     It is not intended     to diagnose infection nor to     guide or monitor treatment.    Medical History: Past Medical History  Diagnosis Date  . Chest pain, atypical   . Hypertension   . Hyperlipidemia   . Chronic anticoagulation   . S/P AVR     for endocarditis  . Ascending aortic aneurysm 01/07/2014  . Anxiety   . Depression   . Asthma    Assessment: Superficial sternal wound, vancomycin already ordered by MD.   Goal of Therapy:  Vancomycin  trough level 10-15 mcg/ml  Plan:  -Continue vancomycin 1000 mg IV q12h as already ordered -F/U after OR tomm for vanco plans -Drug levels as indicated   Abran DukeLedford, Mohogany Toppins 02/21/2014,11:53 PM

## 2014-02-21 NOTE — H&P (Signed)
[address]            [place] [id number]          [phone]     [name] [name] is an 58 y.o. male. Jul 19, 1955 BJY:[id number]   Chief Complaint: Superficial sternal wound infection  History of Presenting Illness:  This is a 58 year old Caucasian male status post redo Bentall procedure (with a mechanical AVR-conduit). He initially presented to the office to see [name] on 02/19/2014 for follow up regarding sternal pain. According to the patient, he had been having sternal pain and along the right side of his chest since undergoing chiropractic manipulation shortly after [name]. He also had complaints of swelling of the sternal incision but denied any drainage. He had been to the emergency room on 02/14/2014. A CTA of the chest was done. It showed no PE, the arotic repair to be intact, small bilateral pleural effusions, and a small retrosternal fluid collection (seroma vs hematoma). The sternal wound was aspirated and he was placed on Keflex 500 mg by mouth tid as well as Neurontin 300 mg by mouth bid (for neuropathic pain). He was also given a refill for Oxycodone to PRN for pain. He then returned to the office today 02/21/2014 for a follow up and wound check. The patient had some subcutaneous fluid aspirated from the upper sternal incision 48 hours ago. Aspirate showed no bacteria but with WBCs present. No growth to present.  The upper sternal incision is now red and tender and starting to drain spontaneously. The sternum is stable. Chest x-ray is clear. Sternal wires are intact. Temperature is 99.0 degrees. Patient was MRSA positive for nasal swab pre op. Under sterile prep and local 1% lidocaine the upper portion of the sternal wound was incised and drained of. Material which was recultured. The wound was packed with saline wet-to-dry dressings. A smaller incision and drainage was performed more inferiorly and packed with a 2 x  2 gauze. He was instructed that he would be [name] for IV antibiotics, as well as I&D of the sternal wound with wound vac placement in the OR on 02/22/2014.  Past Medical History: Past Medical History  Diagnosis Date  . Chest pain, atypical   . Hypertension   . Hyperlipidemia   . Chronic anticoagulation   . S/P AVR     for endocarditis  . Ascending aortic aneurysm 01/07/2014  . Anxiety   . Depression   . Asthma     Past Surgical History: Past Surgical History  Procedure Laterality Date  . Aortic valve replacement  1988    23mm St. Jude valve  . Doppler echocardiography  06/12/1996    EF 55-60%  . Cardiac catheterization    . Hand surgery    . Bentall procedure N/A 01/23/2014    Procedure: BENTALL PROCEDURE;  Surgeon: [name] PernaPeter [name] Trigt, MD;  [facility];  Service: Open Heart Surgery;  Laterality: N/A;  . Aortic valve replacement N/A 01/23/2014    Procedure: REDO AORTIC VALVE REPLACEMENT (AVR) with 23 St. Jude Mechanical Aortic Valved Graft;  Surgeon: [name] PernaPeter [name] Trigt, MD;  [name];  Service: Open Heart Surgery;  Laterality: N/A;  . Intraoperative transesophageal echocardiogram N/A 01/23/2014    Procedure: INTRAOPERATIVE TRANSESOPHAGEAL ECHOCARDIOGRAM;  Surgeon: [name] PernaPeter [name] Trigt, MD;  [facility];  Service: Open Heart Surgery;  Laterality: N/A;    Family History: Family History  Problem Relation Age of Onset  . Cancer Mother   . Healthy Brother   . Healthy Brother      Social History:   reports that he quit smoking about 27 years ago. He does not have any smokeless tobacco history on file. He reports that he drinks about 1.8 ounces of alcohol per week. He reports that he uses illicit drugs (Marijuana).  Allergies:  No Known Allergies  Medications Prior to Admission: Albuterol (PROVENTIL HFA;VENTOLIN HFA) 108 (90 BASE) MCG/ACT inhaler  Inhale 2 puffs into the lungs every 6 (six) hours as needed for wheezing or shortness of breath. ALPRAZolam  (XANAX) 0.5 MG tablet  Take 0.5 mg by mouth daily as needed for anxiety. Amiodarone (PACERONE) 200 MG tablet  Take 200 mg by mouth daily.  Enteric coated aspirin 81 mg Take 1 (one) table daily  diphenhydramine-acetaminophen (TYLENOL PM) 25-500 MG TABS  Take 1 tablet by mouth at bedtime as needed (sleep). ezetimibe-simvastatin (VYTORIN) 10-20 MG per tablet  Take 1 tablet by mouth at bedtime.  Keflex 500 mg Take 1 tablet by mouth 3 (three) times daily Metoprolol tartrate (LOPRESSOR) 25 MG tablet  Take 12.5 mg by mouth 2 (two) times daily.  Neurontin 300 mg Take 1 tablet by mouth 2 (two) times daily OxyCODONE (OXY IR/ROXICODONE) 5 MG immediate release tablet  Polyethylene glycol (MIRALAX / GLYCOLAX) packet  Take 17 g by mouth 2 (two) times daily.  Warfarin (COUMADIN) 5 MG tablet Take 2.5-5 mg by mouth daily. Take 2.5mg  on all other days ( Mon, Tues, Wed, Fri, & Sat)  Take 5mg  on Sunday & Thursday ONLY    Review of Systems:  Cardiac Review of Systems: Y or N  Chest Pain (incisional) [ Y ] Resting SOB [ N ] Exertional SOB [ N ] Orthopnea [ N]  Pedal Edema Klaus.Mock[N ] Palpitations Klaus.Mock[N ] Syncope [ N] Presyncope [ N ]  General Review of Systems: [Y] = yes [ ] =no  Constitional: anorexia Klaus.Mock[N ]; nausea Klaus.Mock[N ]; night sweats [ N]; fever [ N]; or chills [ N];  Eye : blurred vision Klaus.Mock[N ]; diplopia Klaus.Mock[N ]; vision changes Klaus.Mock[N ]; Amaurosis fugax[N ];  Resp: cough [ N]; wheezing[N ]; hemoptysis[N ] GI: vomiting[N ]; dysphagia[N ]; melena[N ]; hematochezia Klaus.Mock[N ]; heartburn[N ] GU: hematuria[N ]; dysuria Klaus.Mock[N ] Skin: rash [N], swelling[Y ];, hair loss[N ]; peripheral edema[N ]; or itching[ N];  Musculosketetal: myalgias[N ]; joint swelling[ N]; Heme/Lymph: bruising[ N]; bleeding[N ]; anemia[ ] ;  Neuro: TIA[ N]; headaches[N ]; stroke[N ]; vertigo[N ]; seizures[N ]; paresthesias[ ] ; difficulty walking[N ];  Psych:depression[N ] Endocrine: Pre-diabetes[Y ]; thyroid dysfunction[ N]   Vital Signs: Temp 99 (37.2) HR 86  RR 19 BP 132/80  Physical Exam: General appearance: alert, cooperative and no distress Heart: RRR, systolic flow murmur Lungs: clear to auscultation bilaterally Abdomen: soft, non-tender; bowel sounds normal; no masses,  no organomegaly Extremities: extremities normal, atraumatic, no cyanosis or edema Wound: Portion of the sternal wound previously opened. Packing in place. Axillary wound is clean, dry, well healed and no signs of infection. HEENT: Head-atraumatic Eyes-EOMI, PERRLA, sclera are non icteric Neci-supple,no JVD   Diagnostic Studies and Laboratory Results: Results for orders placed in visit on 02/19/14 (from the past 48 hour(s))  CULTURE, ROUTINE-ABSCESS     Status: None   Collection Time    02/19/14  4:15 PM      Result Value Ref Range  Gram Stain Abundant     Gram Stain WBC present-predominately PMN     Gram Stain No Squamous Epithelial Cells Seen     Gram Stain No Organisms Seen     Preliminary Report NO GROWTH 1 DAY     Dg Chest 2 View  02/21/2014   CLINICAL DATA:  Aortic valve replacement  EXAM: CHEST  2 VIEW  COMPARISON:  02/14/2014  FINDINGS: Mild cardiomegaly. Aortic valve prosthetic stable. Tiny pleural effusions and bibasilar atelectasis versus airspace disease, best seen on the lateral view are all improved. Normal vascularity. No pneumothorax. Upper lungs are clear.  IMPRESSION: Minimal bibasilar atelectasis versus airspace disease and small pleural effusions have improved. No evidence of cardiac decompensation.   Electronically Signed   By: Maryclare Bean M.D.   On: 02/21/2014 13:10     Assessment/Plan 1. Superficial sternal wound infection, s/p I and D at office. He is being admitted so that he can undergo an operative incision and debridement and wound VAC placement. This will be done on 02/22/2014 by Dr. Donata Clay.  2. S/P redo sternotomy for AVR (St. Jude mechanical valve) By Dr. Donata Clay on 01/23/2014. Check INR as on Coumadin prior to admission. NO  coumadin tonight. Check INR.  Ardelle Balls, PA-C 02/21/2014, 3:58 PM   patient examined and medical record reviewed,agree with above note. [name] [name],[name] [name] 02/28/2014

## 2014-02-21 NOTE — Progress Notes (Signed)
PCP is Lupe CarneyMITCHELL,DEAN, MD Referring Provider is SwazilandJordan, Peter M, MD  Chief Complaint  Patient presents with  . Follow-up    f/u with would check and needle aspiration, same day cxr    HPI: 58 year old Caucasian male status post redo Bentall procedure with a mechanical aVR-conduit. Office followup and wound check. Patient had some subcutaneous fluid aspirated from the upper sternal incision 48 hours ago. Aspirate showed no bacteria but with WBCs present. No growth to present. Patient started on oral Keflex and he returns today. The upper sternal incision is now red and tender and starting to drain spontaneously. The sternum is stable. Chest x-ray is clear. Sternal wires are intact. Temperature is 99.0 degrees. Patient was MRSA positive for nasal swab preop  Under sterile prep and local 1% lidocaine the upper portion of the sternal wound was incised and drained of. Material which was recultured. The wound was packed with  saline wet-to-dry dressings. A smaller incision and drainage was performed more inferiorly and packed with a 2 x 2 gauze.  The incision under the right clavicle for axillary artery cannulation is healing normally. Past Medical History  Diagnosis Date  . Chest pain, atypical   . Hypertension   . Hyperlipidemia   . Chronic anticoagulation   . S/P AVR     for endocarditis  . Ascending aortic aneurysm 01/07/2014  . Anxiety   . Depression   . Asthma     Past Surgical History  Procedure Laterality Date  . Aortic valve replacement  1988    23mm St. Jude valve  . Doppler echocardiography  06/12/1996    EF 55-60%  . Cardiac catheterization    . Hand surgery    . Bentall procedure N/A 01/23/2014    Procedure: BENTALL PROCEDURE;  Surgeon: Kerin PernaPeter Van Trigt, MD;  [facility];  Service: Open Heart Surgery;  Laterality: N/A;  . Aortic valve replacement N/A 01/23/2014    Procedure: REDO AORTIC VALVE REPLACEMENT (AVR) with 23 St. Jude Mechanical Aortic Valved Graft;  Surgeon:  Kerin PernaPeter Van Trigt, MD;  [facility];  Service: Open Heart Surgery;  Laterality: N/A;  . Intraoperative transesophageal echocardiogram N/A 01/23/2014    Procedure: INTRAOPERATIVE TRANSESOPHAGEAL ECHOCARDIOGRAM;  Surgeon: Kerin PernaPeter Van Trigt, MD;  [facility];  Service: Open Heart Surgery;  Laterality: N/A;    Family History  Problem Relation Age of Onset  . Cancer Mother   . Healthy Brother   . Healthy Brother     Social History History  Substance Use Topics  . Smoking status: Former Smoker    Quit date: 05/04/1986  . Smokeless tobacco: Not on file  . Alcohol Use: 1.8 oz/week    3 Shots of liquor per week     Comment: daily    Current Outpatient Prescriptions  Medication Sig Dispense Refill  . albuterol (PROVENTIL HFA;VENTOLIN HFA) 108 (90 BASE) MCG/ACT inhaler Inhale 2 puffs into the lungs every 6 (six) hours as needed for wheezing or shortness of breath.      . ALPRAZolam (XANAX) 0.5 MG tablet Take 0.5 mg by mouth daily as needed for anxiety.       Marland Kitchen. amiodarone (PACERONE) 200 MG tablet Take 200 mg by mouth daily.      Marland Kitchen. amoxicillin (AMOXIL) 500 MG capsule       . diphenhydramine-acetaminophen (TYLENOL PM) 25-500 MG TABS Take 1 tablet by mouth at bedtime as needed (sleep).      . ezetimibe-simvastatin (VYTORIN) 10-20 MG per tablet Take 1  tablet by mouth at bedtime.       . metoprolol tartrate (LOPRESSOR) 25 MG tablet Take 12.5 mg by mouth 2 (two) times daily.      Marland Kitchen. oxyCODONE (OXY IR/ROXICODONE) 5 MG immediate release tablet       . polyethylene glycol (MIRALAX / GLYCOLAX) packet Take 17 g by mouth 2 (two) times daily.  24 each  0  . warfarin (COUMADIN) 5 MG tablet Take 2.5-5 mg by mouth daily. Take 2.5mg  on all other days ( Mon, Tues, Wed, Fri, & Sat) Take 5mg  on Sunday & Thursday ONLY       No current facility-administered medications for this visit.    No Known Allergies  Review of Systems right-sided chest pain Patient presented to the emergency department for chest  pain 1 week ago and CT chest showed no pulmonary embolus and inntact aortic root repair. Sternal closure is intact.  BP 132/80  Pulse 86  Temp(Src) 99 F (37.2 C)  Resp 19  Ht 5\' 10"  (1.778 m)  Wt 184 lb (83.462 kg)  BMI 26.40 kg/m2  SpO2 95% Physical Exam Alert and appropriate Lungs clear Heart rhythm regular without murmur or gallop Sternal incision with superficial infection is noted Abdomen clean and dry Lungs clear Extremities without edema  Diagnostic Tests: Chest x-ray clear Wound culture sent from office  Impression: Superficial sternal infection following redo Bentall,  Plan: Superficial I&D performed an office Patient was admitted for IV antibiotic therapy and formal intraoperative debridement with wound VAC placement scheduled for a.m.

## 2014-02-21 NOTE — Progress Notes (Signed)
Spoke with Dr. Bartle per PALaneta Simmers request, INR > 2.0, MD aware. [name],[name] [name] 6:41 PM

## 2014-02-22 ENCOUNTER — Encounter (HOSPITAL_COMMUNITY)
Admission: AD | Disposition: A | Discharge status: Home Health | Payer: Self-pay | Source: Ambulatory Visit | Attending: Cardiothoracic Surgery

## 2014-02-22 ENCOUNTER — Inpatient Hospital Stay (HOSPITAL_COMMUNITY): Payer: BC Managed Care – PPO | Admitting: Certified Registered"

## 2014-02-22 ENCOUNTER — Inpatient Hospital Stay (HOSPITAL_COMMUNITY): Payer: BC Managed Care – PPO

## 2014-02-22 ENCOUNTER — Inpatient Hospital Stay (HOSPITAL_COMMUNITY)
Admission: RE | Admit: 2014-02-22 | Payer: BC Managed Care – PPO | Source: Ambulatory Visit | Admitting: Cardiothoracic Surgery

## 2014-02-22 DIAGNOSIS — T814XXA Infection following a procedure, initial encounter: Secondary | ICD-10-CM

## 2014-02-22 HISTORY — PX: APPLICATION OF WOUND VAC: SHX5189

## 2014-02-22 HISTORY — PX: I & D EXTREMITY: SHX5045

## 2014-02-22 LAB — PROTIME-INR
INR: 1.91 — AB (ref 0.00–1.49)
PROTHROMBIN TIME: 22.1 s — AB (ref 11.6–15.2)

## 2014-02-22 SURGERY — IRRIGATION AND DEBRIDEMENT EXTREMITY
Anesthesia: General | Site: Chest

## 2014-02-22 MED ORDER — PHENYLEPHRINE HCL 10 MG/ML IJ SOLN
10.0000 mg | INTRAMUSCULAR | Status: DC | PRN
  Administered 2014-02-22: 25 ug/min via INTRAVENOUS

## 2014-02-22 MED ORDER — POTASSIUM CHLORIDE 10 MEQ/50ML IV SOLN
10.0000 meq | Freq: Every day | INTRAVENOUS | Status: DC | PRN
  Filled 2014-02-22: qty 50

## 2014-02-22 MED ORDER — ONDANSETRON HCL 4 MG/2ML IJ SOLN
4.0000 mg | Freq: Four times a day (QID) | INTRAMUSCULAR | Status: DC | PRN
Start: 2014-02-22 — End: 2014-02-22

## 2014-02-22 MED ORDER — ROCURONIUM BROMIDE 100 MG/10ML IV SOLN
INTRAVENOUS | Status: DC | PRN
  Administered 2014-02-22: 10 mg via INTRAVENOUS
  Administered 2014-02-22: 30 mg via INTRAVENOUS
  Administered 2014-02-22: 10 mg via INTRAVENOUS

## 2014-02-22 MED ORDER — ONDANSETRON HCL 4 MG/2ML IJ SOLN
INTRAMUSCULAR | Status: DC | PRN
  Administered 2014-02-22: 4 mg via INTRAVENOUS

## 2014-02-22 MED ORDER — HYDROMORPHONE HCL 1 MG/ML IJ SOLN: 0.2500 mg | INTRAMUSCULAR | Status: DC | PRN

## 2014-02-22 MED ORDER — NALOXONE HCL 0.4 MG/ML IJ SOLN: 0.4000 mg | INTRAMUSCULAR | Status: DC | PRN

## 2014-02-22 MED ORDER — WARFARIN - PHYSICIAN DOSING INPATIENT
Freq: Every day | Status: DC
  Administered 2014-02-23 – 2014-03-04 (×4)

## 2014-02-22 MED ORDER — TRAMADOL HCL 50 MG PO TABS: 50.0000 mg | ORAL_TABLET | Freq: Four times a day (QID) | ORAL | Status: DC | PRN

## 2014-02-22 MED ORDER — FENTANYL 10 MCG/ML IV SOLN
INTRAVENOUS | Status: DC
Start: 2014-02-22 — End: 2014-02-23
  Administered 2014-02-22: 60 ug via INTRAVENOUS
  Administered 2014-02-22: 12:00:00 via INTRAVENOUS
  Filled 2014-02-22: qty 50

## 2014-02-22 MED ORDER — SODIUM CHLORIDE 0.9 % IJ SOLN: 9.0000 mL | INTRAMUSCULAR | Status: DC | PRN

## 2014-02-22 MED ORDER — LIDOCAINE HCL (CARDIAC) 20 MG/ML IV SOLN
INTRAVENOUS | Status: DC | PRN
  Administered 2014-02-22: 80 mg via INTRAVENOUS

## 2014-02-22 MED ORDER — OXYCODONE HCL 5 MG/5ML PO SOLN: 5.0000 mg | Freq: Once | ORAL | Status: DC | PRN

## 2014-02-22 MED ORDER — GLYCOPYRROLATE 0.2 MG/ML IJ SOLN
INTRAMUSCULAR | Status: DC | PRN
  Administered 2014-02-22: 0.6 mg via INTRAVENOUS

## 2014-02-22 MED ORDER — ACETAMINOPHEN 160 MG/5ML PO SOLN: 1000.0000 mg | Freq: Four times a day (QID) | ORAL | Status: DC

## 2014-02-22 MED ORDER — ACETAMINOPHEN 500 MG PO TABS
1000.0000 mg | ORAL_TABLET | Freq: Four times a day (QID) | ORAL | Status: DC
  Administered 2014-02-22 – 2014-02-23 (×6): 1000 mg via ORAL
  Filled 2014-02-22 (×7): qty 2

## 2014-02-22 MED ORDER — DIPHENHYDRAMINE HCL 50 MG/ML IJ SOLN: 12.5000 mg | Freq: Four times a day (QID) | INTRAMUSCULAR | Status: DC | PRN

## 2014-02-22 MED ORDER — SODIUM CHLORIDE 0.9 % IR SOLN
Status: DC | PRN
  Administered 2014-02-22: 1000 mL

## 2014-02-22 MED ORDER — PROMETHAZINE HCL 25 MG/ML IJ SOLN: 6.2500 mg | INTRAMUSCULAR | Status: DC | PRN

## 2014-02-22 MED ORDER — VANCOMYCIN HCL 1000 MG IV SOLR
INTRAVENOUS | Status: DC | PRN
  Administered 2014-02-22: 08:00:00

## 2014-02-22 MED ORDER — PROPOFOL 10 MG/ML IV BOLUS
INTRAVENOUS | Status: DC | PRN
  Administered 2014-02-22: 100 mg via INTRAVENOUS

## 2014-02-22 MED ORDER — SENNOSIDES-DOCUSATE SODIUM 8.6-50 MG PO TABS
1.0000 | ORAL_TABLET | Freq: Every day | ORAL | Status: DC
  Administered 2014-02-22: 1 via ORAL
  Filled 2014-02-22 (×5): qty 1

## 2014-02-22 MED ORDER — PHENYLEPHRINE HCL 10 MG/ML IJ SOLN
INTRAMUSCULAR | Status: DC | PRN
  Administered 2014-02-22: 80 ug via INTRAVENOUS
  Administered 2014-02-22: 40 ug via INTRAVENOUS
  Administered 2014-02-22: 80 ug via INTRAVENOUS
  Administered 2014-02-22: 40 ug via INTRAVENOUS

## 2014-02-22 MED ORDER — WARFARIN SODIUM 2.5 MG PO TABS
2.5000 mg | ORAL_TABLET | Freq: Every day | ORAL | Status: DC
  Filled 2014-02-22: qty 1

## 2014-02-22 MED ORDER — LACTATED RINGERS IV SOLN
INTRAVENOUS | Status: DC | PRN
  Administered 2014-02-22: 07:00:00 via INTRAVENOUS

## 2014-02-22 MED ORDER — BISACODYL 5 MG PO TBEC
10.0000 mg | DELAYED_RELEASE_TABLET | Freq: Every day | ORAL | Status: DC
  Administered 2014-02-27: 10 mg via ORAL
  Filled 2014-02-22 (×4): qty 2

## 2014-02-22 MED ORDER — DIPHENHYDRAMINE HCL 12.5 MG/5ML PO ELIX
12.5000 mg | ORAL_SOLUTION | Freq: Four times a day (QID) | ORAL | Status: DC | PRN
  Filled 2014-02-22: qty 5

## 2014-02-22 MED ORDER — MIDAZOLAM HCL 5 MG/5ML IJ SOLN
INTRAMUSCULAR | Status: DC | PRN
  Administered 2014-02-22: 2 mg via INTRAVENOUS

## 2014-02-22 MED ORDER — VANCOMYCIN HCL 1000 MG IV SOLR
INTRAVENOUS | Status: DC
  Filled 2014-02-22: qty 1000

## 2014-02-22 MED ORDER — OXYCODONE HCL 5 MG PO TABS: 5.0000 mg | ORAL_TABLET | Freq: Once | ORAL | Status: DC | PRN

## 2014-02-22 MED ORDER — NEOSTIGMINE METHYLSULFATE 10 MG/10ML IV SOLN
INTRAVENOUS | Status: DC | PRN
  Administered 2014-02-22: 4 mg via INTRAVENOUS

## 2014-02-22 MED ORDER — FENTANYL CITRATE 0.05 MG/ML IJ SOLN
INTRAMUSCULAR | Status: DC | PRN
  Administered 2014-02-22: 150 ug via INTRAVENOUS
  Administered 2014-02-22 (×2): 50 ug via INTRAVENOUS

## 2014-02-22 MED ORDER — OXYCODONE HCL 5 MG PO TABS
5.0000 mg | ORAL_TABLET | ORAL | Status: DC | PRN
  Administered 2014-02-24 – 2014-02-28 (×4): 10 mg via ORAL
  Filled 2014-02-22 (×4): qty 2

## 2014-02-22 MED ORDER — ONDANSETRON HCL 4 MG/2ML IJ SOLN
4.0000 mg | Freq: Four times a day (QID) | INTRAMUSCULAR | Status: DC | PRN
  Administered 2014-02-23: 4 mg via INTRAVENOUS
  Filled 2014-02-22: qty 2

## 2014-02-22 SURGICAL SUPPLY — 71 items
APL SKNCLS STERI-STRIP NONHPOA (GAUZE/BANDAGES/DRESSINGS)
ATTRACTOMAT 16X20 MAGNETIC DRP (DRAPES) ×2 IMPLANT
BANDAGE ELASTIC 4 VELCRO ST LF (GAUZE/BANDAGES/DRESSINGS) IMPLANT
BANDAGE ELASTIC 6 VELCRO ST LF (GAUZE/BANDAGES/DRESSINGS) IMPLANT
BENZOIN TINCTURE PRP APPL 2/3 (GAUZE/BANDAGES/DRESSINGS) IMPLANT
BLADE SURG 10 STRL SS (BLADE) IMPLANT
BLADE SURG 15 STRL LF DISP TIS (BLADE) IMPLANT
BLADE SURG 15 STRL SS (BLADE)
BNDG GAUZE ELAST 4 BULKY (GAUZE/BANDAGES/DRESSINGS) IMPLANT
CANISTER SUCTION 2500CC (MISCELLANEOUS) ×3 IMPLANT
CANISTER WOUND CARE 500ML ATS (WOUND CARE) ×2 IMPLANT
CATH THORACIC 28FR RT ANG (CATHETERS) IMPLANT
CATH THORACIC 36FR (CATHETERS) IMPLANT
CATH THORACIC 36FR RT ANG (CATHETERS) IMPLANT
CLIP TI WIDE RED SMALL 24 (CLIP) IMPLANT
CONT SPEC 4OZ CLIKSEAL STRL BL (MISCELLANEOUS) IMPLANT
COVER OVERHEAD TABLE (DRAPES) ×2 IMPLANT
COVER SURGICAL LIGHT HANDLE (MISCELLANEOUS) ×4 IMPLANT
DRAPE CARDIOVASCULAR INCISE (DRAPES) ×3
DRAPE LAPAROSCOPIC ABDOMINAL (DRAPES) ×1 IMPLANT
DRAPE PROXIMA HALF (DRAPES) ×2 IMPLANT
DRAPE SLUSH/WARMER DISC (DRAPES) ×2 IMPLANT
DRAPE SRG 135X102X78XABS (DRAPES) IMPLANT
DRSG AQUACEL AG ADV 3.5X14 (GAUZE/BANDAGES/DRESSINGS) ×1 IMPLANT
DRSG PAD ABDOMINAL 8X10 ST (GAUZE/BANDAGES/DRESSINGS) IMPLANT
DRSG VAC ATS LRG SENSATRAC (GAUZE/BANDAGES/DRESSINGS) ×2 IMPLANT
ELECT BLADE 4.0 EZ CLEAN MEGAD (MISCELLANEOUS) ×3
ELECT BLADE 6.5 EXT (BLADE) ×2 IMPLANT
ELECT REM PT RETURN 9FT ADLT (ELECTROSURGICAL) ×3
ELECTRODE BLDE 4.0 EZ CLN MEGD (MISCELLANEOUS) IMPLANT
ELECTRODE REM PT RTRN 9FT ADLT (ELECTROSURGICAL) ×1 IMPLANT
GAUZE SPONGE 4X4 12PLY STRL (GAUZE/BANDAGES/DRESSINGS) ×1 IMPLANT
GAUZE XEROFORM 5X9 LF (GAUZE/BANDAGES/DRESSINGS) IMPLANT
GLOVE BIO SURGEON STRL SZ7.5 (GLOVE) ×6 IMPLANT
GLOVE BIOGEL PI IND STRL 7.0 (GLOVE) IMPLANT
GLOVE BIOGEL PI INDICATOR 7.0 (GLOVE) ×6
GOWN STRL REUS W/ TWL LRG LVL3 (GOWN DISPOSABLE) ×2 IMPLANT
GOWN STRL REUS W/TWL LRG LVL3 (GOWN DISPOSABLE) ×6
HANDPIECE INTERPULSE COAX TIP (DISPOSABLE) ×3
HEMOSTAT POWDER SURGIFOAM 1G (HEMOSTASIS) IMPLANT
HEMOSTAT SURGICEL 2X14 (HEMOSTASIS) IMPLANT
KIT BASIN OR (CUSTOM PROCEDURE TRAY) ×3 IMPLANT
KIT ROOM TURNOVER OR (KITS) ×3 IMPLANT
KIT SUCTION CATH 14FR (SUCTIONS) IMPLANT
NS IRRIG 1000ML POUR BTL (IV SOLUTION) ×3 IMPLANT
PACK CHEST (CUSTOM PROCEDURE TRAY) ×1 IMPLANT
PACK GENERAL/GYN (CUSTOM PROCEDURE TRAY) ×3 IMPLANT
PAD ARMBOARD 7.5X6 YLW CONV (MISCELLANEOUS) ×6 IMPLANT
SET HNDPC FAN SPRY TIP SCT (DISPOSABLE) ×1 IMPLANT
SPONGE LAP 18X18 X RAY DECT (DISPOSABLE) ×3 IMPLANT
STAPLER VISISTAT 35W (STAPLE) IMPLANT
STRAP MONTGOMERY 1.25X11-1/8 (MISCELLANEOUS) IMPLANT
SUT ETHILON 3 0 FSL (SUTURE) IMPLANT
SUT STEEL 6MS V (SUTURE) IMPLANT
SUT STEEL STERNAL CCS#1 18IN (SUTURE) IMPLANT
SUT STEEL SZ 6 DBL 3X14 BALL (SUTURE) IMPLANT
SUT VIC AB 1 CTX 36 (SUTURE)
SUT VIC AB 1 CTX36XBRD ANBCTR (SUTURE) IMPLANT
SUT VIC AB 2-0 CTX 27 (SUTURE) IMPLANT
SUT VIC AB 2-0 CTX 36 (SUTURE) IMPLANT
SUT VIC AB 3-0 SH 27 (SUTURE)
SUT VIC AB 3-0 SH 27X BRD (SUTURE) IMPLANT
SUT VIC AB 3-0 X1 27 (SUTURE) ×3 IMPLANT
SWAB COLLECTION DEVICE MRSA (MISCELLANEOUS) ×2 IMPLANT
SYR 5ML LL (SYRINGE) IMPLANT
TOWEL OR 17X24 6PK STRL BLUE (TOWEL DISPOSABLE) ×3 IMPLANT
TOWEL OR 17X26 10 PK STRL BLUE (TOWEL DISPOSABLE) ×5 IMPLANT
TRAY FOLEY CATH 14FRSI W/METER (CATHETERS) IMPLANT
TUBE ANAEROBIC SPECIMEN COL (MISCELLANEOUS) ×2 IMPLANT
TUBING BULK SUCTION (MISCELLANEOUS) ×4 IMPLANT
WATER STERILE IRR 1000ML POUR (IV SOLUTION) ×3 IMPLANT

## 2014-02-22 NOTE — Brief Op Note (Signed)
02/21/2014 - 02/22/2014  8:53 AM  PATIENT:  [name] [name]  58 y.o. male  PRE-OPERATIVE DIAGNOSIS: Superficial sternal WOUND INFECTION  POST-OPERATIVE DIAGNOSIS: Superficial sternal  WOUND INFECTION  PROCEDURE:  Procedure(s) with comments: IRRIGATION AND DEBRIDEMENT EXTREMITY-CHEST (N/A) - PULSE LAVAGE APPLICATION OF WOUND VAC (N/A)  SURGEON:  Surgeon(s) and Role:    * Kerin PernaPeter Van Trigt, MD - Primary  PHYSICIAN ASSISTANT:   ASSISTANTS: none   ANESTHESIA:   general  EBL:  Total I/O In: 700 [I.V.:700] Out: 300 [Urine:200; Blood:100]  BLOOD ADMINISTERED:none  DRAINS:wound vac above sternal closure  LOCAL MEDICATIONS USED:  NONE  SPECIMEN:  Wound cultures  DISPOSITION OF SPECIMEN: microlab  COUNTS:  YES  TOURNIQUET:  * No tourniquets in log *  DICTATION: .Dragon Dictation  PLAN OF CARE: Admit to inpatient   PATIENT DISPOSITION:  ICU - extubated and stable.   Delay start of Pharmacological VTE agent (>24hrs) due to surgical blood loss or risk of bleeding: yes- hold coumadin this pm

## 2014-02-22 NOTE — Transfer of Care (Signed)
Immediate Anesthesia Transfer of Care Note  Patient: [name] [name]  Procedure(s) Performed: Procedure(s) with comments: IRRIGATION AND DEBRIDEMENT EXTREMITY-CHEST (N/A) - PULSE LAVAGE APPLICATION OF WOUND VAC (N/A)  Patient Location: PACU  Anesthesia Type:General  Level of Consciousness: awake, alert  and oriented  Airway & Oxygen Therapy: Patient connected to face mask oxygen  Post-op Assessment: Report given to PACU RN  Post vital signs: stable  Complications: No apparent anesthesia complications

## 2014-02-22 NOTE — Progress Notes (Signed)
IV team unable to insert PICC today.

## 2014-02-22 NOTE — Progress Notes (Signed)
The patient was examined and preop studies reviewed. There has been no change from the prior exam and the patient is ready for surgery.  Plan sternal debridement and wound VAC on T Hashem

## 2014-02-22 NOTE — Anesthesia Postprocedure Evaluation (Signed)
  Anesthesia Post-op Note  Patient: [name] [name]  Procedure(s) Performed: Procedure(s) with comments: IRRIGATION AND DEBRIDEMENT EXTREMITY-CHEST (N/A) - PULSE LAVAGE APPLICATION OF WOUND VAC (N/A)  Patient Location: PACU  Anesthesia Type:General  Level of Consciousness: awake, alert  and oriented  Airway and Oxygen Therapy: Patient Spontanous Breathing  Post-op Pain: none  Post-op Assessment: Post-op Vital signs reviewed  Post-op Vital Signs: Reviewed  Last Vitals:  Filed Vitals:   02/22/14 1000  BP: 100/60  Pulse: 72  Temp:   Resp: 17    Complications: No apparent anesthesia complications

## 2014-02-22 NOTE — Anesthesia Procedure Notes (Signed)
Procedure Name: Intubation Date/Time: 02/22/2014 9:46 AM Performed by: Ellin GoodieWEAVER, Yates Weisgerber M Pre-anesthesia Checklist: Patient identified, Emergency Drugs available, Suction available, Patient being monitored and Timeout performed Patient Re-evaluated:Patient Re-evaluated prior to inductionOxygen Delivery Method: Circle system utilized Preoxygenation: Pre-oxygenation with 100% oxygen Intubation Type: IV induction Ventilation: Mask ventilation without difficulty Laryngoscope Size: Mac and 3 Grade View: Grade I Tube type: Oral Tube size: 7.5 mm Number of attempts: 1 Airway Equipment and Method: Stylet Placement Confirmation: ETT inserted through vocal cords under direct vision,  positive ETCO2 and breath sounds checked- equal and bilateral Secured at: 23 cm Tube secured with: Tape Dental Injury: Teeth and Oropharynx as per pre-operative assessment  Comments: Easy induction and intubation with MAC 3 blade.  Dr. Sampson GoonFitzgerald verified placement.  Carlynn HeraldH Weaver, CRNA

## 2014-02-22 NOTE — Anesthesia Preprocedure Evaluation (Addendum)
Anesthesia Evaluation  Patient identified by MRN, date of birth, ID band Patient awake    Reviewed: Allergy & Precautions, H&P , NPO status , Patient's Chart, lab work & pertinent test results  Airway Mallampati: II  TM Distance: >3 FB Neck ROM: Full    Dental  (+) Dental Advisory Given   Pulmonary asthma , former smoker,          Cardiovascular hypertension, + Peripheral Vascular Disease + Valvular Problems/Murmurs (s/p mechanical AVR. Normal LV function)     Neuro/Psych Anxiety Depression negative neurological ROS     GI/Hepatic negative GI ROS, Neg liver ROS,   Endo/Other  negative endocrine ROS  Renal/GU negative Renal ROS     Musculoskeletal negative musculoskeletal ROS (+)   Abdominal   Peds  Hematology negative hematology ROS (+)   Anesthesia Other Findings   Reproductive/Obstetrics                            Anesthesia Physical Anesthesia Plan  ASA: III  Anesthesia Plan: General   Post-op Pain Management:    Induction: Intravenous  Airway Management Planned: LMA  Additional Equipment:   Intra-op Plan:   Post-operative Plan: Extubation in OR  Informed Consent: I have reviewed the patients History and Physical, chart, labs and discussed the procedure including the risks, benefits and alternatives for the proposed anesthesia with the patient or authorized representative who has indicated his/her understanding and acceptance.   Dental advisory given  Plan Discussed with: CRNA and Surgeon  Anesthesia Plan Comments:         Anesthesia Quick Evaluation

## 2014-02-23 ENCOUNTER — Inpatient Hospital Stay (HOSPITAL_COMMUNITY): Payer: BC Managed Care – PPO

## 2014-02-23 LAB — BASIC METABOLIC PANEL
Anion gap: 10 (ref 5–15)
BUN: 10 mg/dL (ref 6–23)
CO2: 24 mEq/L (ref 19–32)
Calcium: 9.1 mg/dL (ref 8.4–10.5)
Chloride: 101 mEq/L (ref 96–112)
Creatinine, Ser: 0.96 mg/dL (ref 0.50–1.35)
GFR calc Af Amer: >90 mL/min (ref >90)
GFR calc non Af Amer: >90 mL/min (ref >90)
Glucose, Bld: 140 mg/dL — ABNORMAL HIGH (ref 70–99)
Potassium: 3.9 mEq/L (ref 3.7–5.3)
Sodium: 135 mEq/L — ABNORMAL LOW (ref 137–147)

## 2014-02-23 LAB — CBC
HCT: 32.9 % — ABNORMAL LOW (ref 39.0–52.0)
Hemoglobin: 10.2 g/dL — ABNORMAL LOW (ref 13.0–17.0)
MCH: 25.4 pg — ABNORMAL LOW (ref 26.0–34.0)
MCHC: 31 g/dL (ref 30.0–36.0)
MCV: 81.8 fL (ref 78.0–100.0)
Platelets: 252 10*3/uL (ref 150–400)
RBC: 4.02 MIL/uL — ABNORMAL LOW (ref 4.22–5.81)
RDW: 15.4 % (ref 11.5–15.5)
WBC: 7.8 10*3/uL (ref 4.0–10.5)

## 2014-02-23 LAB — CULTURE, ROUTINE-ABSCESS
Gram Stain: NONE SEEN
Gram Stain: NONE SEEN
Organism ID, Bacteria: NO GROWTH

## 2014-02-23 LAB — VANCOMYCIN, TROUGH: Vancomycin Tr: 6.1 ug/mL — ABNORMAL LOW (ref 10.0–20.0)

## 2014-02-23 LAB — PROTIME-INR
INR: 1.52 — ABNORMAL HIGH (ref 0.00–1.49)
PROTHROMBIN TIME: 18.4 s — AB (ref 11.6–15.2)

## 2014-02-23 MED ORDER — SODIUM CHLORIDE 0.9 % IJ SOLN
10.0000 mL | Freq: Two times a day (BID) | INTRAMUSCULAR | Status: DC
  Administered 2014-02-23 – 2014-03-03 (×6): 10 mL

## 2014-02-23 MED ORDER — WARFARIN SODIUM 5 MG PO TABS
5.0000 mg | ORAL_TABLET | Freq: Once | ORAL | Status: AC
  Administered 2014-02-23: 5 mg via ORAL
  Filled 2014-02-23: qty 1

## 2014-02-23 MED ORDER — SODIUM CHLORIDE 0.9 % IJ SOLN
10.0000 mL | INTRAMUSCULAR | Status: DC | PRN
  Administered 2014-02-25 – 2014-03-05 (×17): 10 mL
  Filled 2014-02-23 (×17): qty 40

## 2014-02-23 MED ORDER — WARFARIN SODIUM 2.5 MG PO TABS
2.5000 mg | ORAL_TABLET | Freq: Every day | ORAL | Status: DC
Start: 2014-02-24 — End: 2014-03-03
  Administered 2014-02-24 – 2014-03-02 (×7): 2.5 mg via ORAL
  Filled 2014-02-23 (×8): qty 1

## 2014-02-23 NOTE — Progress Notes (Signed)
Peripherally Inserted Central Catheter/Midline Placement  The IV Nurse has discussed with the patient and/or persons authorized to consent for the patient, the purpose of this procedure and the potential benefits and risks involved with this procedure.  The benefits include less needle sticks, lab draws from the catheter and patient may be discharged home with the catheter.  Risks include, but not limited to, infection, bleeding, blood clot (thrombus formation), and puncture of an artery; nerve damage and irregular heat beat.  Alternatives to this procedure were also discussed.  PICC/Midline Placement Documentation  PICC / Midline Double Lumen 02/23/14 PICC Right Basilic 42 cm 0 cm (Active)  Indication for Insertion or Continuance of Line Home intravenous therapies (PICC only) 02/23/2014  9:00 AM  Exposed Catheter (cm) 0 cm 02/23/2014  9:00 AM  Lumen #1 Status Flushed;Saline locked;Blood return noted 02/23/2014  9:00 AM  Lumen #2 Status Flushed;Saline locked;Blood return noted 02/23/2014  9:00 AM  Dressing Change Due 03/02/14 02/23/2014  9:00 AM       Ethelda Chickurrie, Becki Mccaskill Robert 02/23/2014, 9:09 AM

## 2014-02-23 NOTE — Progress Notes (Addendum)
1 Day Post-Op Procedure(s) (LRB): IRRIGATION AND DEBRIDEMENT EXTREMITY-CHEST (N/A) APPLICATION OF WOUND VAC (N/A) Subjective:  Mild soreness at upper end of sternotomy  Objective: Vital signs in last 24 hours: Temp:  [97.3 F (36.3 C)-97.9 F (36.6 C)] 97.9 F (36.6 C) (11/01 0800) Pulse Rate:  [68-85] 82 (11/01 0900) Cardiac Rhythm:  [-] Normal sinus rhythm (11/01 0800) Resp:  [6-26] 20 (11/01 0900) BP: (90-124)/(48-74) 120/68 mmHg (11/01 0900) SpO2:  [90 %-98 %] 92 % (11/01 0900) Weight:  [82.328 kg (181 lb 8 oz)] 82.328 kg (181 lb 8 oz) (11/01 0630)  Hemodynamic parameters for last 24 hours:    Intake/Output from previous day: 10/31 0701 - 11/01 0700 In: 3197 [P.O.:360; I.V.:2487; IV Piggyback:350] Out: 3450 [Urine:3200; Drains:150; Blood:100] Intake/Output this shift: Total I/O In: 50 [I.V.:50] Out: -   General appearance: alert and cooperative Heart: regular rate and rhythm, mechanical valve sounds normal Lungs: clear to auscultation bilaterally Wound: wound vac in place  Lab Results:  Recent Labs  02/21/14 1953 02/23/14 0308  WBC 13.3* 7.8  HGB 11.0* 10.2*  HCT 34.3* 32.9*  PLT 295 252   BMET:  Recent Labs  02/21/14 1953 02/23/14 0308  NA 139 135*  K 4.3 3.9  CL 98 101  CO2 28 24  GLUCOSE 101* 140*  BUN 18 10  CREATININE 1.16 0.96  CALCIUM 9.6 9.1    PT/INR:  Recent Labs  02/23/14 0308  LABPROT 18.4*  INR 1.52*   ABG    Component Value Date/Time   PHART 7.473* 02/21/2014 1930   HCO3 26.3* 02/21/2014 1930   TCO2 27.5 02/21/2014 1930   ACIDBASEDEF 7.0* 01/24/2014 1549   O2SAT 94.0 02/21/2014 1930   CBG (last 3)  No results for input(s): GLUCAP in the last 72 hours.  Assessment/Plan: S/P Procedure(s) (LRB): IRRIGATION AND DEBRIDEMENT EXTREMITY-CHEST (N/A) APPLICATION OF WOUND VAC (N/A)  Doing well following debridement yesterday. PICC line placed this am. Continue antibiotic. He is not using the PCA so will switch to oral meds  and transfer to 2W. Start ambulating. Continue coumadin for mechanical valve.  LOS: 2 days    BARTLE,BRYAN K 02/23/2014

## 2014-02-24 ENCOUNTER — Encounter (HOSPITAL_COMMUNITY): Payer: Self-pay | Admitting: Cardiothoracic Surgery

## 2014-02-24 LAB — COMPREHENSIVE METABOLIC PANEL
ALT: 59 U/L — ABNORMAL HIGH (ref 0–53)
AST: 38 U/L — ABNORMAL HIGH (ref 0–37)
Albumin: 2.8 g/dL — ABNORMAL LOW (ref 3.5–5.2)
Alkaline Phosphatase: 71 U/L (ref 39–117)
Anion gap: 11 (ref 5–15)
BUN: 10 mg/dL (ref 6–23)
CO2: 27 mEq/L (ref 19–32)
Calcium: 9.7 mg/dL (ref 8.4–10.5)
Chloride: 104 mEq/L (ref 96–112)
Creatinine, Ser: 0.94 mg/dL (ref 0.50–1.35)
GFR calc Af Amer: >90 mL/min (ref >90)
GFR calc non Af Amer: >90 mL/min (ref >90)
Glucose, Bld: 123 mg/dL — ABNORMAL HIGH (ref 70–99)
Potassium: 3.9 mEq/L (ref 3.7–5.3)
Sodium: 142 mEq/L (ref 137–147)
Total Bilirubin: <0.2 mg/dL — ABNORMAL LOW (ref 0.3–1.2)
Total Protein: 6.9 g/dL (ref 6.0–8.3)

## 2014-02-24 LAB — TYPE AND SCREEN
ABO/RH(D): B POS
Antibody Screen: NEGATIVE
Unit division: 0
Unit division: 0

## 2014-02-24 LAB — CBC
HCT: 32.6 % — ABNORMAL LOW (ref 39.0–52.0)
Hemoglobin: 10.3 g/dL — ABNORMAL LOW (ref 13.0–17.0)
MCH: 25.8 pg — ABNORMAL LOW (ref 26.0–34.0)
MCHC: 31.6 g/dL (ref 30.0–36.0)
MCV: 81.7 fL (ref 78.0–100.0)
Platelets: 301 10*3/uL (ref 150–400)
RBC: 3.99 MIL/uL — ABNORMAL LOW (ref 4.22–5.81)
RDW: 15.5 % (ref 11.5–15.5)
WBC: 11.4 10*3/uL — ABNORMAL HIGH (ref 4.0–10.5)

## 2014-02-24 LAB — PREPARE FRESH FROZEN PLASMA
Unit division: 0
Unit division: 0

## 2014-02-24 LAB — PROTIME-INR
INR: 1.62 — ABNORMAL HIGH (ref 0.00–1.49)
Prothrombin Time: 19.4 seconds — ABNORMAL HIGH (ref 11.6–15.2)

## 2014-02-24 MED ORDER — SODIUM CHLORIDE 0.9 % IV SOLN
1500.0000 mg | Freq: Two times a day (BID) | INTRAVENOUS | Status: DC
  Administered 2014-02-24 – 2014-02-28 (×10): 1500 mg via INTRAVENOUS
  Filled 2014-02-24 (×14): qty 1500

## 2014-02-24 MED ORDER — FENTANYL CITRATE 0.05 MG/ML IJ SOLN
50.0000 ug | Freq: Once | INTRAMUSCULAR | Status: AC
  Administered 2014-02-24: 50 ug via INTRAVENOUS
  Filled 2014-02-24: qty 2

## 2014-02-24 MED ORDER — ENSURE COMPLETE PO LIQD
237.0000 mL | Freq: Three times a day (TID) | ORAL | Status: DC
  Administered 2014-02-24 – 2014-03-03 (×15): 237 mL via ORAL

## 2014-02-24 NOTE — Plan of Care (Signed)
Problem: Phase II Progression Outcomes Goal: Pain controlled Outcome: Completed/Met Date Met:  02/24/14 Goal: Vital signs stable Outcome: Completed/Met Date Met:  02/24/14 Goal: Foley discontinued Outcome: Completed/Met Date Met:  02/24/14

## 2014-02-24 NOTE — Progress Notes (Signed)
2 Days Post-Op Procedure(s) (LRB): IRRIGATION AND DEBRIDEMENT EXTREMITY-CHEST (N/A) APPLICATION OF WOUND VAC (N/A) Progressing after sternal debridement nsr Wound granulating well- VAC change today  Objective: Vital signs in last 24 hours: Temp:  [97.8 F (36.6 C)-99 F (37.2 C)] 97.8 F (36.6 C) (11/02 1235) Pulse Rate:  [78-79] 79 (11/02 1235) Cardiac Rhythm:  [-] Normal sinus rhythm (11/02 1230) Resp:  [16-26] 20 (11/02 1235) BP: (87-131)/(49-83) 119/77 mmHg (11/02 1235) SpO2:  [96 %] 96 % (11/02 1235)  Hemodynamic parameters for last 24 hours:  afebrile  Intake/Output from previous day: 11/01 0701 - 11/02 0700 In: 1360 [P.O.:640; I.V.:170; IV Piggyback:550] Out: 1150 [Urine:1100; Drains:50] Intake/Output this shift: Total I/O In: 860 [P.O.:360; IV Piggyback:500] Out: 200 [Urine:200]    Lab Results:  Recent Labs  02/23/14 0308 02/24/14 0443  WBC 7.8 11.4*  HGB 10.2* 10.3*  HCT 32.9* 32.6*  PLT 252 301   BMET:  Recent Labs  02/23/14 0308 02/24/14 0443  NA 135* 142  K 3.9 3.9  CL 101 104  CO2 24 27  GLUCOSE 140* 123*  BUN 10 10  CREATININE 0.96 0.94  CALCIUM 9.1 9.7    PT/INR:  Recent Labs  02/24/14 0443  LABPROT 19.4*  INR 1.62*   ABG    Component Value Date/Time   PHART 7.473* 02/21/2014 1930   HCO3 26.3* 02/21/2014 1930   TCO2 27.5 02/21/2014 1930   ACIDBASEDEF 7.0* 01/24/2014 1549   O2SAT 94.0 02/21/2014 1930   CBG (last 3)  No results for input(s): GLUCAP in the last 72 hours.  Assessment/Plan: S/P Procedure(s) (LRB): IRRIGATION AND DEBRIDEMENT EXTREMITY-CHEST (N/A) APPLICATION OF WOUND VAC (N/A) No growth from OR specimen Cont IV antibiotics Change VAC M-W-F  LOS: 3 days    VAN TRIGT III,PETER 02/24/2014

## 2014-02-24 NOTE — Progress Notes (Signed)
Patient transferred with belongings, medications, and chart to room 2 [address] 15 with nurse. Attached to monitor and new nurse at bedside. No new problems at this time.  [name] [name], Dhruv Christina A

## 2014-02-24 NOTE — Progress Notes (Signed)
Report received at 1140 and pt arrived to the unit via wheelchair at 1225. Pt A&O x4; denies any pain, vitals taken; pt oriented to the room and unit; wound vac intact to sternum; pt sitting up in chair with call light within reach. Will continue to monitor quietly. Arabella MerlesP. Amo Deanda Ruddell RN.

## 2014-02-24 NOTE — Consult Note (Signed)
WOC wound consult note Reason for Consult: NPWT VAC dressing change, verified with bedside nursing staff CVTS did not indicate this am that they needed to see his wound.  Wound type: surgical  Pressure Ulcer POA: No Measurement: 18cm x 5cm x 2.5cm with 4cm tunnel at 12 o'clock  Wound bed:100% pink, moist Drainage (amount, consistency, odor) some sanguineous oozing with dressing change  Periwound: intact  Dressing procedure/placement/frequency: 1pc of black granufoam used to fill the tunneled area and then remainder fills the wound bed. Seal at 125mmHG, pt tolerated without problems. Premedicate prior to dressing change.  Ask appropriate questions about therapy. Stressed importance of high protein diet in wound healing.  WOC will follow along for support with VAC dressings as needed.  Salam Micucci TracyAustin RN,CWOCN [phone]

## 2014-02-25 ENCOUNTER — Other Ambulatory Visit: Payer: Self-pay | Admitting: Cardiothoracic Surgery

## 2014-02-25 DIAGNOSIS — IMO0001 Reserved for inherently not codable concepts without codable children: Secondary | ICD-10-CM

## 2014-02-25 DIAGNOSIS — T814XXD Infection following a procedure, subsequent encounter: Principal | ICD-10-CM

## 2014-02-25 LAB — CBC
HCT: 33 % — ABNORMAL LOW (ref 39.0–52.0)
Hemoglobin: 10.3 g/dL — ABNORMAL LOW (ref 13.0–17.0)
MCH: 25.6 pg — ABNORMAL LOW (ref 26.0–34.0)
MCHC: 31.2 g/dL (ref 30.0–36.0)
MCV: 81.9 fL (ref 78.0–100.0)
Platelets: 312 10*3/uL (ref 150–400)
RBC: 4.03 MIL/uL — ABNORMAL LOW (ref 4.22–5.81)
RDW: 15.6 % — ABNORMAL HIGH (ref 11.5–15.5)
WBC: 12.1 10*3/uL — ABNORMAL HIGH (ref 4.0–10.5)

## 2014-02-25 LAB — BASIC METABOLIC PANEL
Anion gap: 13 (ref 5–15)
BUN: 11 mg/dL (ref 6–23)
CO2: 25 mEq/L (ref 19–32)
Calcium: 9.9 mg/dL (ref 8.4–10.5)
Chloride: 103 mEq/L (ref 96–112)
Creatinine, Ser: 1.13 mg/dL (ref 0.50–1.35)
GFR calc Af Amer: 82 mL/min — ABNORMAL LOW (ref >90)
GFR calc non Af Amer: 70 mL/min — ABNORMAL LOW (ref >90)
Glucose, Bld: 112 mg/dL — ABNORMAL HIGH (ref 70–99)
Potassium: 4 mEq/L (ref 3.7–5.3)
Sodium: 141 mEq/L (ref 137–147)

## 2014-02-25 LAB — PROTIME-INR
INR: 1.88 — ABNORMAL HIGH (ref 0.00–1.49)
Prothrombin Time: 21.8 seconds — ABNORMAL HIGH (ref 11.6–15.2)

## 2014-02-25 LAB — WOUND CULTURE
Culture: NO GROWTH
Gram Stain: NONE SEEN

## 2014-02-25 NOTE — Care Management Note (Signed)
    Page 1 of 2   03/05/2014     3:28:00 PM CARE MANAGEMENT NOTE 03/05/2014  Patient:  COBAIN, MORICI   Account Number:  [id number]  Date Initiated:  02/25/2014  Documentation initiated by:  Carlee Vonderhaar  Subjective/Objective Assessment:   Pt adm on 02/21/14 with sternal wound infection s/p CABG. PTA, pt independent, lives alone--brother lives across the street.     Action/Plan:   Met with pt to discuss dc plans.  Appears he will likely need home VAC and IV antibiotics.  Past hx of [facility] with CABG, and wishes to use again.   Anticipated DC Date:  03/05/2014   Anticipated DC Plan:  [place]  CM consult      [facility] Choice  HOME HEALTH   Choice offered to / List presented to:  C-1 Patient   DME arranged  VAC      DME agency  [facility]     HH arranged  HH-1 RN      [place].   Status of service:  Completed, signed off Medicare Important Message given?  NA - LOS <3 / Initial given by admissions (If response is "NO", the following Medicare IM given date fields will be blank) Date Medicare IM given:   Medicare IM given by:   Date Additional Medicare IM given:   Additional Medicare IM given by:    Discharge Disposition:  [place]  Per UR Regulation:  Reviewed for med. necessity/level of care/duration of stay  If discussed at [place] of Stay Meetings, dates discussed:   02/27/2014    Comments:   03/05/14 Ellan Lambert, RN, BSN [phone] Pt for dc home today.  IV abx and PICC have been discontinued, as antibiotics transitioned to oral.  Home VAC applied.  HHRN to follow up on Friday for dressing change.  02/28/14 Ellan Lambert, RN, BSN [phone] [facility] insurance auth form and referral faxed to [facility] for approval today; VAC to be released later today and delivered to pt's room.   02/25/14 Ellan Lambert, RN, BSN [phone] Pt states it is doubtful that his brother can assist much with IV abx,  as he is "a busy man."  MD/PA:  please leave orders for IV antibiotics and home VAC if needed at dc--do not see this in the MD notes.  Pt states he feels like he will be able to perform the IV abx infusions himself.  Will refer to [facility], per pt choice.  Will follow progress.

## 2014-02-25 NOTE — Progress Notes (Signed)
[facility]  Patient Status: [facility]  [facility] the [facility]: [facility] and [facility]fusion [facility] for home IV ABX. [facility] will follow pt while here and support transition home. [facility] Infusion Coordinator will support in hospital teaching re: home IV ABX to support smooth DC home.   If patient discharges after hours, please call [phone].   [name] [name] 02/25/2014, 4:41 PM

## 2014-02-25 NOTE — Progress Notes (Addendum)
      [address]       [name] [id number]             [phone]        3 Days Post-Op Procedure(s) (LRB): IRRIGATION AND DEBRIDEMENT EXTREMITY-CHEST (N/A) APPLICATION OF WOUND VAC (N/A)  Subjective: Patient still with pain/sorenes along right side of ribs (has had since chiro manipulation). Eating breakfast this am.  Objective: Vital signs in last 24 hours: Temp:  [97.7 F (36.5 C)-99.1 F (37.3 C)] 97.7 F (36.5 C) (11/03 0545) Pulse Rate:  [75-86] 75 (11/03 0545) Cardiac Rhythm:  [-] Normal sinus rhythm (11/02 1951) Resp:  [17-26] 18 (11/03 0545) BP: (105-130)/(63-77) 121/67 mmHg (11/03 0545) SpO2:  [96 %-97 %] 96 % (11/03 0545)   Current Weight  02/23/14 181 lb 8 oz (82.328 kg)       Intake/Output from previous day: 11/02 0701 - 11/03 0700 In: 860 [P.O.:360; IV Piggyback:500] Out: 200 [Urine:200]   Physical Exam:  Cardiovascular: RRR, sharp valve click Pulmonary: Clear to auscultation bilaterally; no rales, wheezes, or rhonchi. Abdomen: Soft, non tender, bowel sounds present. Extremities: Trace extremity edema. Wounds: Wound VAC in place  Lab Results: CBC: Recent Labs  02/24/14 0443 02/25/14 0410  WBC 11.4* 12.1*  HGB 10.3* 10.3*  HCT 32.6* 33.0*  PLT 301 312   BMET:  Recent Labs  02/24/14 0443 02/25/14 0410  NA 142 141  K 3.9 4.0  CL 104 103  CO2 27 25  GLUCOSE 123* 112*  BUN 10 11  CREATININE 0.94 1.13  CALCIUM 9.7 9.9    PT/INR:  Lab Results  Component Value Date   INR 1.88* 02/25/2014   INR 1.62* 02/24/2014   INR 1.52* 02/23/2014   ABG:  INR: Will add last result for INR, ABG once components are confirmed Will add last 4 CBG results once components are confirmed  Assessment/Plan:  1. CV - SR. On Amiodarone 200 bid, Lopressor 12.5 bid, and Coumadin. INR increased from 1.62 to 1.88. Will continue with Coumadin 2.5 for now.Has mechanical valve so INR should be at least 2.5. 2.  Wound-VAC in place. To be  changed in am. 3.  Acute blood loss anemia - H and H stable at 10.3 and 33 this am. 4.ID-WBC slightly increased but on Vanco and Zosyn.Marland Kitchen.  ZIMMERMAN,DONIELLE MPA-C 02/25/2014,7:45 AM  Patient needs continued hospitalization for IV antibiotics and complex chest wound management. Continue daily Coumadin 2.5 mg  for mechanical valve-conduit  patient examined and medical record reviewed,agree with above note. VAN TRIGT III,Afua Hoots 02/25/2014

## 2014-02-26 ENCOUNTER — Ambulatory Visit: Payer: Self-pay | Admitting: Cardiothoracic Surgery

## 2014-02-26 LAB — PROTIME-INR
INR: 1.95 — AB (ref 0.00–1.49)
PROTHROMBIN TIME: 22.4 s — AB (ref 11.6–15.2)

## 2014-02-26 MED ORDER — DOCUSATE SODIUM 100 MG PO CAPS
200.0000 mg | ORAL_CAPSULE | Freq: Every day | ORAL | Status: DC
  Administered 2014-02-26 – 2014-02-27 (×2): 200 mg via ORAL
  Filled 2014-02-26 (×3): qty 2

## 2014-02-26 NOTE — Op Note (Signed)
NAMDaisy Lazar:  [name], [name]               ACCOUNT NO.:  [id number]  MEDICAL RECORD NO.:  [id number]  LOCATION:                                 FACILITY:  PHYSICIAN:  Kerin PernaPeter Van Trigt, M.D.  DATE OF BIRTH:  1955-12-22  DATE OF PROCEDURE:  02/22/2014 DATE OF DISCHARGE:                              OPERATIVE REPORT   OPERATION:  Sternal wound debridement, irrigation and wound VAC application.  SURGEON:  Kerin PernaPeter Van Trigt, MD  PREOPERATIVE DIAGNOSIS:  Superficial sternal wound infection after redo aortic valve-aortic root replacement.  POSTOPERATIVE DIAGNOSIS:  Superficial sternal wound infection after redo aortic valve-aortic root replacement.  ANESTHESIA:  General.  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  He did well after surgery and went home within a week following the operation.  Following discharge, he developed some fluid collection in the sternal wound superiorly and was started on oral antibiotics.  He returned for a followup office visit 48 hours later and he had developed redness and tenderness and had an obvious superficial wound infection.  He was admitted to the hospital for IV antibiotics and debridement. Prior to surgery, I discussed the procedure, sternal wound debridement, and wound VAC application with the patient including the indications, alternatives, benefits, and risks.  He understood that he would receive general anesthesia and the entire sternal incision would need to be opened, debrided, and irrigated and then packed with the wound VAC drainage.  He understood he would be in the hospital for several days following surgery for wound care and IV antibiotics.  OPERATIVE PROCEDURE:  The patient was brought to the operating room, placed supine on the operating room table.  General anesthesia was induced.  He remained hemodynamically stable.  The  chest and abdomen were prepped and draped as a sterile field.  The previous sternal incision was opened.  There was a defect down to the fascial layer with a superficial wound infection, fat necrosis, and some purulent material which was sent for culture.  There was a space in the suprasternal notch with some granulation tissue.  All this fat necrosis was sharply debrided and then irrigated with a L of saline followed by a 0.5 L of vancomycin irrigation.  There were no sternal wires removed or exposed and the sternum appeared to be intact.  A wound VAC was trimmed to an appropriate length and width and placed in the bed of the wound including the space in the suprasternal notch area.  This was covered with a sterile Vi-Drape and connected to the suction canister.  The patient was reversed from anesthesia, returned to recovery room in stable condition.  Blood loss was approximately 30 mL.     Kerin PernaPeter Van Trigt, M.D.     PV/MEDQ  D:  02/22/2014  T:  02/22/2014  Job:  [id number]

## 2014-02-26 NOTE — Plan of Care (Signed)
Problem: Phase II Progression Outcomes Goal: Progress activity as tolerated unless otherwise ordered Outcome: Completed/Met Date Met:  02/26/14 Goal: Progressing with IS, TCDB Outcome: Completed/Met Date Met:  02/26/14 Goal: Surgical site without signs of infection Outcome: Completed/Met Date Met:  02/26/14 Goal: Dressings dry/intact Outcome: Completed/Met Date Met:  02/26/14 Goal: Return of bowel function (flatus, BM) IF ABDOMINAL SURGERY:  Outcome: Completed/Met Date Met:  02/26/14 Goal: Tolerating diet Outcome: Completed/Met Date Met:  02/26/14

## 2014-02-26 NOTE — Progress Notes (Addendum)
      [address]       [name] [id number]             [phone]        4 Days Post-Op Procedure(s) (LRB): IRRIGATION AND DEBRIDEMENT EXTREMITY-CHEST (N/A) APPLICATION OF WOUND VAC (N/A)  Subjective: Patient requesting stool softener this am as with constipation.  Objective: Vital signs in last 24 hours: Temp:  [97.8 F (36.6 C)-98.7 F (37.1 C)] 97.8 F (36.6 C) (11/04 0429) Pulse Rate:  [71-88] 84 (11/04 0429) Cardiac Rhythm:  [-] Normal sinus rhythm (11/03 2020) Resp:  [18] 18 (11/04 0429) BP: (124-133)/(67-72) 133/70 mmHg (11/04 0429) SpO2:  [97 %] 97 % (11/04 0429)   Current Weight  02/23/14 181 lb 8 oz (82.328 kg)       Intake/Output from previous day: 11/03 0701 - 11/04 0700 In: 480 [P.O.:480] Out: 210 [Urine:200; Drains:10]   Physical Exam:  Cardiovascular: RRR, sharp valve click Pulmonary: Clear to auscultation bilaterally; no rales, wheezes, or rhonchi. Abdomen: Soft, non tender, bowel sounds present. Extremities: Trace extremity edema. Wounds: Wound VAC in place  Lab Results: CBC:  Recent Labs  02/24/14 0443 02/25/14 0410  WBC 11.4* 12.1*  HGB 10.3* 10.3*  HCT 32.6* 33.0*  PLT 301 312   BMET:   Recent Labs  02/24/14 0443 02/25/14 0410  NA 142 141  K 3.9 4.0  CL 104 103  CO2 27 25  GLUCOSE 123* 112*  BUN 10 11  CREATININE 0.94 1.13  CALCIUM 9.7 9.9    PT/INR:  Lab Results  Component Value Date   INR 1.95* 02/26/2014   INR 1.88* 02/25/2014   INR 1.62* 02/24/2014   ABG:  INR: Will add last result for INR, ABG once components are confirmed Will add last 4 CBG results once components are confirmed  Assessment/Plan:  1. CV - SR. On Amiodarone 200 daily, Lopressor 12.5 bid, and Coumadin. INR increased from 1.88 to 1.95. Will give Coumadin 2.5 tonight as taken at home .Has mechanical valve so INR should be at least 2.5-3. 2.  Wound-VAC in place. To be changed this am. 3.  Acute blood loss anemia - H  and H stable at 10.3 and 33 this am. 4.ID-On Vanco and Zosyn. 5. Colace  ZIMMERMAN,DONIELLE MPA-C 02/26/2014,7:32 AM   Continue current complex wound care and IV antibiotics Operative wound cultures no growth Wound responding to current therapy Encourage by mouth intake and improved nutrition  patient examined and medical record reviewed,agree with above note. VAN TRIGT III,Kenlyn Lose 02/26/2014

## 2014-02-26 NOTE — Discharge Summary (Signed)
Physician Discharge Summary       [address] [place]       [place] [address]             [phone]    Patient ID: [name] [name] MRN: [id number] DOB/AGE: 1955/10/31 58 y.o.  Admit date: 02/21/2014 Discharge date: 03/05/2014  Admission Diagnoses: 1. Superficial sternal wound infection 2. 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 History of hypertension 4. History of hyperlipidemia 5. History of tobacco abuse 6. History of a fib 7. 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 Superficial sternal wound infection 2. 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 History of hypertension 4. History of hyperlipidemia 5. History of tobacco abuse 6. History of a fib 7. 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 Donata Clay on 02/22/2014  History of Presenting Illness: This is a 58 year old Caucasian male status post redo Bentall procedure (with a mechanical AVR-conduit). He initially presented to the office to see Dr. Donata Clay on 02/19/2014 for follow up regarding sternal pain. According to the patient, he had been having sternal pain and along the right side of his chest since undergoing chiropractic manipulation shortly after discharge from [facility]. He also had complaints of swelling of the sternal incision but denied any drainage. He had been to the emergency room on 02/14/2014. A CTA of the chest was done. It showed no PE, the arotic repair to be intact, small bilateral pleural effusions, and a small retrosternal fluid collection (seroma vs hematoma). The sternal wound was aspirated and he was placed on Keflex 500 mg by mouth tid as well as Neurontin 300 mg by mouth  bid (for neuropathic pain). He was also given a refill for Oxycodone to PRN for pain. He then returned to the office today 02/21/2014 for a follow up and wound check. The patient had some subcutaneous fluid aspirated from the upper sternal incision 48 hours ago. Aspirate showed no bacteria but with WBCs present. No growth to present. The upper sternal incision is now red and tender and starting to drain spontaneously. The sternum is stable. Chest x-ray is clear. Sternal wires are intact. Temperature is 99.0 degrees. Patient was MRSA positive for nasal swab pre op. Under sterile prep and local 1% lidocaine the upper portion of the sternal wound was incised and drained of. Material which was recultured. The wound was packed with saline wet-to-dry dressings. A smaller incision and drainage was performed more inferiorly and packed with a 2 x 2 gauze. He was instructed that he would be admitted to [facility] for IV antibiotics, as well as I&D of the sternal wound with wound vac placement in the OR on 02/22/2014.  Brief Hospital Course:  He did have a fever to 100.6. He then became afebrile. He remained hemodynamically stable.His WBC continued to decline. He was on both Zosyn and Vancomycin for IV antibiotics. Wound vac was changed every Monday, Wednesday, and Friday. Sternal wound continued to improve. There was granulation present. He did develop diarrhea. Stool softeners were stopped. C dif was ordered and he was plut on Flagyl. C Dif was negative so Flagyl was stopped. His renal function continued to worsen. This was felt secondary to the Vancomycin.  This was stopped on 03/03/2014. He was then continued on Zosyn IV. His creatinine is down to 1.96. He is felt surgically stable for discharge. Per Dr Donata ClayVan Trigt, he will be discharged on oral Augmentin and Doxycycline. We will ask home health to draw a PT and INR on Friday 03/07/2014, change the wound vac every Monday, Wednesday, and Friday. Finally, home health is  to draw a BMET on Monday and call or fax results to our office ([facility]).  Latest Vital Signs: Blood pressure 109/64, pulse 74, temperature 98 F (36.7 C), temperature source Oral, resp. rate 18, height 5\' 10"  (1.778 m), weight 173 lb 14.4 oz (78.881 kg), SpO2 93 %.  Physical Exam: Cardiovascular: RRR, sharp valve click Pulmonary: Clear to auscultation bilaterally; no rales, wheezes, or rhonchi. Abdomen: Soft, non tender, bowel sounds present. Extremities: Trace extremity edema. Wounds: Wound VAC in place  Discharge Condition:Stable  Recent laboratory studies:  Lab Results  Component Value Date   WBC 12.1* 03/03/2014   HGB 10.5* 03/03/2014   HCT 33.0* 03/03/2014   MCV 80.9 03/03/2014   PLT 322 03/03/2014   Lab Results  Component Value Date   NA 142 03/05/2014   K 4.0 03/05/2014   CL 105 03/05/2014   CO2 25 03/05/2014   CREATININE 1.96* 03/05/2014   GLUCOSE 109* 03/05/2014      Diagnostic Studies: Ct Angio Chest Pe W/cm &/or Wo Cm  02/14/2014   CLINICAL DATA:  Open heart surgery 3 weeks ago. Chest pain a few days ago. Atypical RIGHT-sided chest pain. Thoracic aneurysm.  EXAM: CT ANGIOGRAPHY CHEST WITH CONTRAST  TECHNIQUE: Multidetector CT imaging of the chest was performed using the standard protocol during bolus administration of intravenous contrast. Multiplanar CT image reconstructions and MIPs were obtained to evaluate the vascular anatomy.  CONTRAST:  80mL OMNIPAQUE IOHEXOL 350 MG/ML SOLN  COMPARISON:  None.  FINDINGS: Bones: Recent median sternotomy.  No aggressive osseous lesions.  Cardiovascular: Negative for pulmonary embolism. Technically adequate study. No acute aortic abnormality. Aortic root replacement is present. Heart is mildly enlarged.  Lungs: Dependent atelectasis.  No airspace disease.  Central airways: Patent.  Effusions: Small bilateral dependently layering pleural effusions. Small pericardial effusion is present. There is also a small retrosternal fluid  collection measuring 3 cm transverse by 19 mm AP. This probably represents postoperative seroma or hematoma. This is expected following median sternotomy.  Lymphadenopathy: Mildly enlarged RIGHT hilar lymph node is probably reactive/congestive. Precarinal lymph node appears similar, measuring 11 mm short axis.  Esophagus: Normal.  Upper abdomen: Within normal limits.  Other: There appears to be a remnant of a vascular graft in the RIGHT axilla.  Review of the MIP images confirms the above findings.  IMPRESSION: 1. Technically adequate study without pulmonary embolism. 2. Small bilateral pleural effusions are probably residual from recent operation. 3. Expected appearance of the heart and anterior mediastinum following reason median sternotomy. Aortic root replacement and aortic valve replacement.   Electronically Signed   By: Andreas NewportGeoffrey  Lamke M.D.   On: 02/14/2014 16:50   Dg Chest Port 1 View  02/23/2014   CLINICAL DATA:  Postop from aortic valve replacement.  EXAM: PORTABLE CHEST - 1 VIEW  COMPARISON:  02/22/2014  FINDINGS: Mild cardiomegaly is stable. Mild atelectasis in left lung base shows no significant change. No evidence of pneumothorax or pleural effusion. Patient has undergone aortic valve replacement.  IMPRESSION: Mild left basilar atelectasis, without significant change. No pneumothorax visualized.   Electronically Signed   By: Jonny RuizJohn  Eppie GibsonStahl M.D.   On: 02/23/2014 11:12    Discharge Medications:   Medication List    STOP taking these medications        amoxicillin 500 MG capsule  Commonly known as:  AMOXIL      TAKE these medications        albuterol 108 (90 BASE) MCG/ACT inhaler  Commonly known as:  PROVENTIL HFA;VENTOLIN HFA  Inhale 2 puffs into the lungs every 6 (six) hours as needed for wheezing or shortness of breath.     ALPRAZolam 0.5 MG tablet  Commonly known as:  XANAX  Take 0.5 mg by mouth daily as needed for anxiety.     amiodarone 200 MG tablet  Commonly known as:   PACERONE  Take 200 mg by mouth daily.     amoxicillin-clavulanate 875-125 MG per tablet  Commonly known as:  AUGMENTIN  Take 1 tablet by mouth 2 (two) times daily.     aspirin 81 MG EC tablet  Take 1 tablet (81 mg total) by mouth daily.     diphenhydramine-acetaminophen 25-500 MG Tabs  Commonly known as:  TYLENOL PM  Take 1 tablet by mouth at bedtime as needed (sleep).     doxycycline 100 MG tablet  Commonly known as:  VIBRA-TABS  Take 1 tablet (100 mg total) by mouth every 12 (twelve) hours.     ezetimibe-simvastatin 10-20 MG per tablet  Commonly known as:  VYTORIN  Take 1 tablet by mouth at bedtime.     ferrous fumarate-b12-vitamic C-folic acid capsule  Commonly known as:  TRINSICON / FOLTRIN  Take 1 capsule by mouth daily. For one month then stop.     loperamide 1 MG/5ML solution  Commonly known as:  IMODIUM  Take 5 mLs (1 mg total) by mouth as needed for diarrhea or loose stools.     metoprolol tartrate 25 MG tablet  Commonly known as:  LOPRESSOR  Take 12.5 mg by mouth 2 (two) times daily.     oxyCODONE 5 MG immediate release tablet  Commonly known as:  Oxy IR/ROXICODONE  Take 1 tablet (5 mg total) by mouth every 4 (four) hours as needed for severe pain.     polyethylene glycol packet  Commonly known as:  MIRALAX / GLYCOLAX  Take 17 g by mouth 2 (two) times daily.     warfarin 5 MG tablet  Commonly known as:  COUMADIN  Take 0.5 tablets (2.5 mg total) by mouth daily. Take 2.5mg  daily or as directed        Follow Up Appointments: Follow-up Information    Follow up with Peter SwazilandJordan, MD.   Specialty:  Cardiology   Why:  Call for a follow up appointment   Contact information:   [address] [place] [address] [phone]       Follow up with VAN Dinah BeersRIGT III,PETER, MD On 03/12/2014.   Specialty:  Cardiothoracic Surgery   Why:  PA/LAT CXR to be taken ([facility] which is in the same building as Dr. Zenaida NieceVan Trigt's office) on 03/12/2014  one hour prior to office appointment with Dr. Hazeline JunkerVan Trigt;Office will call with appointment time with Dr. Katherine BassetVan T   Contact information:   [address] [place] [address] [phone]       Follow up with Home health.   Why:  Please draw PT and INR on Friday 03/07/2014 and call or fax results to Dr. Elvis CoilJordan's office      Follow  up with Home Health.   Why:  Please change wound vac every Monday, Wedensday, and Friday      Follow up with Home Health.   Why:  Please draw BMET on Monday 03/10/2014 and call or fax results to Dr. Zenaida Niece Trigt's office [phone];fax [phone])      Signed: Doree Fudge MPA-C 03/05/2014, 8:51 AM

## 2014-02-26 NOTE — Plan of Care (Addendum)
Problem: Phase I Progression Outcomes Goal: OOB as tolerated unless otherwise ordered Outcome: Completed/Met Date Met:  02/26/14 Goal: Other Phase I Outcomes/Goals Outcome: Completed/Met Date Met:  02/26/14

## 2014-02-26 NOTE — Consult Note (Signed)
WOC wound follow up Wound type: surgical  Measurement: 18cm x 4cm x 1.5cm  Wound ZOX:WRUEAbed:clean, 100% early, partial granulation tissue  Drainage (amount, consistency, odor) moderate, serousanginous in the canister Periwound: intact  Dressing procedure/placement/frequency: 1pc of black foam used to fill the wound bed, seal at 125mmHG.  Pt tolerated without problems, premedicated with PO pain meds prior to the dressing change. Asked appropriate questions during the dressing change about home care and IV antibiotics.   WOC team will follow along with you for NPWT VAC dressing change support.  Delphine Sizemore AllenAustin RN, [place] [phone]

## 2014-02-27 LAB — ANAEROBIC CULTURE

## 2014-02-27 LAB — PROTIME-INR
INR: 2.38 — ABNORMAL HIGH (ref 0.00–1.49)
Prothrombin Time: 26.2 seconds — ABNORMAL HIGH (ref 11.6–15.2)

## 2014-02-27 NOTE — Progress Notes (Addendum)
      [address]       [name] [id number]             [phone]        5 Days Post-Op Procedure(s) (LRB): IRRIGATION AND DEBRIDEMENT EXTREMITY-CHEST (N/A) APPLICATION OF WOUND VAC (N/A)  Subjective: Patient's only complaint is pain on right side of ribs. He is unable to sleep on that side as it worsens pain. He requesting to go home in am.  Objective: Vital signs in last 24 hours: Temp:  [98 F (36.7 C)-98.3 F (36.8 C)] 98.3 F (36.8 C) (11/05 0548) Pulse Rate:  [72-81] 81 (11/05 0548) Cardiac Rhythm:  [-] Normal sinus rhythm (11/04 2201) Resp:  [17-18] 18 (11/05 0548) BP: (103-129)/(61-69) 112/64 mmHg (11/05 0548) SpO2:  [95 %-97 %] 95 % (11/05 0548)   Current Weight  02/23/14 181 lb 8 oz (82.328 kg)      Intake/Output from previous day: 11/04 0701 - 11/05 0700 In: 720 [P.O.:720] Out: -    Physical Exam:  Cardiovascular: RRR, sharp valve click Pulmonary: Clear to auscultation bilaterally; no rales, wheezes, or rhonchi. Abdomen: Soft, non tender, bowel sounds present. Extremities: Trace extremity edema. Wounds: Wound VAC in place  Lab Results: CBC:  Recent Labs  02/25/14 0410  WBC 12.1*  HGB 10.3*  HCT 33.0*  PLT 312   BMET:   Recent Labs  02/25/14 0410  NA 141  K 4.0  CL 103  CO2 25  GLUCOSE 112*  BUN 11  CREATININE 1.13  CALCIUM 9.9    PT/INR:  Lab Results  Component Value Date   INR 2.38* 02/27/2014   INR 1.95* 02/26/2014   INR 1.88* 02/25/2014   ABG:  INR: Will add last result for INR, ABG once components are confirmed Will add last 4 CBG results once components are confirmed  Assessment/Plan:  1. CV - SR. On Amiodarone 200 daily, Lopressor 12.5 bid, and Coumadin. INR increased from 1.95 to 2.38. Will give Coumadin 5 tonight as taken at home .Has mechanical valve so INR should be at least 2.5-3. 2.  Wound-VAC in place.To be changed in am. 3.  Acute blood loss anemia - Last H and H stable at 10.3 and 33  this am. 4.ID-On Vanco and Zosyn. Wound cultures showed no WBC, no organisms, and no growth.Per Dr. Donata ClayVan Trigt, IV antibiotics to continue through the weekend. 5. Patient has had right sided pain since chiro manipulation. Continue Neurontin and PRN pain meds. Hopefully, will improve with time 6. Likely discharge early next week  ZIMMERMAN,DONIELLE MPA-C 02/27/2014,7:32 AM    Continue current care patient examined and medical record reviewed,agree with above note. VAN TRIGT III,PETER 02/27/2014

## 2014-02-27 NOTE — Consult Note (Signed)
WOC follow-up: Vac dressing intact with good seal to sternal wound but pt pulled off trac pad when transfering to chair.  New trac pad applied and suction on at 125mm cont.  Mod amt reddish brown drainage in cannister (400cc).  Applied new cannister.   Cammie Mcgeeawn Elya Diloreto MSN, RN, CWOCN, [place]CN-AP, CNS [phone]

## 2014-02-27 NOTE — Plan of Care (Signed)
Problem: Phase III Progression Outcomes Goal: Pain controlled on oral analgesia Outcome: Progressing     

## 2014-02-27 NOTE — Plan of Care (Signed)
Problem: Phase III Progression Outcomes Goal: Pain controlled on oral analgesia Outcome: Completed/Met Date Met:  02/27/14 Goal: Activity at appropriate level-compared to baseline (UP IN CHAIR FOR HEMODIALYSIS)  Outcome: Completed/Met Date Met:  02/27/14 Goal: Voiding independently Outcome: Completed/Met Date Met:  02/27/14 Goal: IV changed to normal saline lock Outcome: Completed/Met Date Met:  02/27/14 Goal: Nasogastric tube discontinued Outcome: Not Applicable Date Met:  28/11/88 Goal: Discharge plan remains appropriate-arrangements made Outcome: Completed/Met Date Met:  02/27/14 Goal: Demonstrates TCDB, IS independently Outcome: Completed/Met Date Met:  02/27/14

## 2014-02-28 LAB — PROTIME-INR
INR: 2.25 — AB (ref 0.00–1.49)
Prothrombin Time: 25.1 seconds — ABNORMAL HIGH (ref 11.6–15.2)

## 2014-02-28 MED ORDER — POLYETHYLENE GLYCOL 3350 17 G PO PACK
17.0000 g | PACK | Freq: Every day | ORAL | Status: DC
  Administered 2014-02-28: 17 g via ORAL
  Filled 2014-02-28 (×2): qty 1

## 2014-02-28 NOTE — Consult Note (Signed)
WOC wound follow up Wound type: surgical  Measurement: 18cm x 3.5cm x 1.0cm  Wound ZOX:WRUEAbed:clean, 100% granulation tissue present, filling in very well  Drainage (amount, consistency, odor) moderate, serosanguinous in canister Periwound:intact  Dressing procedure/placement/frequency: 1pc of black foam used to fill the open sternal wound, drape and sealed at 125mmHG.  I have answered the patients questions today about the use of the VAC at home.   He may wish to shower once home on the days when [facility], Dr. Donata ClayVan Trigt will need to ok the patient to shower.   Pt tolerated dressing change well.   WOC team will follow along with you for NPWT VAC dressing changes M/W/F Armen PickupMelody Porchea Charrier RN,CWOCN [phone]

## 2014-02-28 NOTE — Progress Notes (Addendum)
      [address]       [place] [id number]             [phone]        6 Days Post-Op Procedure(s) (LRB): IRRIGATION AND DEBRIDEMENT EXTREMITY-CHEST (N/A) APPLICATION OF WOUND VAC (N/A)  Subjective: Patient's only complaint is pain on right side of ribs. He is unable to sleep on that side as it worsens pain.  Objective: Vital signs in last 24 hours: Temp:  [97.6 F (36.4 C)-98.4 F (36.9 C)] 98.4 F (36.9 C) (11/06 0421) Pulse Rate:  [71-79] 79 (11/06 0421) Cardiac Rhythm:  [-] Normal sinus rhythm;Heart block (11/05 2020) Resp:  [18] 18 (11/06 0421) BP: (99-117)/(58-75) 99/58 mmHg (11/06 0421) SpO2:  [92 %-95 %] 92 % (11/06 0421)   Current Weight  02/23/14 181 lb 8 oz (82.328 kg)      Intake/Output from previous day: 11/05 0701 - 11/06 0700 In: 1150 [P.O.:600; IV Piggyback:550] Out: -    Physical Exam:  Cardiovascular: RRR, sharp valve click Pulmonary: Clear to auscultation bilaterally; no rales, wheezes, or rhonchi. Abdomen: Soft, non tender, bowel sounds present. Extremities: Trace extremity edema. Wounds: Wound VAC in place  Lab Results: CBC: No results for input(s): WBC, HGB, HCT, PLT in the last 72 hours. BMET:  No results for input(s): NA, K, CL, CO2, GLUCOSE, BUN, CREATININE, CALCIUM in the last 72 hours.  PT/INR:  Lab Results  Component Value Date   INR 2.25* 02/28/2014   INR 2.38* 02/27/2014   INR 1.95* 02/26/2014   ABG:  INR: Will add last result for INR, ABG once components are confirmed Will add last 4 CBG results once components are confirmed  Assessment/Plan:  1. CV - SR. On Amiodarone 200 daily, Lopressor 12.5 bid, and Coumadin. INR decreased from 2.38 to 2.25. Will give Coumadin 2.5 tonight as taken at home .Has mechanical valve so INR should be at least 2.5-3. 2.  Wound-VAC in place.To be changed in am. 3.  Acute blood loss anemia - Last H and H stable at 10.3 and 33 this am. 4.ID-On Vanco and Zosyn. Wound  cultures showed no WBC, no organisms, and no growth.Per Dr. Donata ClayVan Trigt, IV antibiotics to continue through the weekend. 5. Patient has had right sided pain since chiro manipulation. Continue Neurontin and PRN pain meds. Hopefully, will improve with time 6. Per patient request, multiple stools yesterday. Stop stool softener and dulcolax. Continue daily Miralax at patient request. 7. Likely discharge Monday  ZIMMERMAN,DONIELLE MPA-C 02/28/2014,7:44 AM  patient examined and medical record reviewed,agree with above note. Home Mon  on IV vanco and po augmentin, wound VAC chg M-W-F,coumadin 2.5 daily withnINR to coumadin clinic 2 days after DC home VAN TRIGT III,PETER 02/28/2014

## 2014-02-28 NOTE — Plan of Care (Signed)
Problem: Phase II Progression Outcomes Goal: Sutures/staples intact Outcome: Progressing

## 2014-03-01 LAB — PROTIME-INR
INR: 2.55 — AB (ref 0.00–1.49)
Prothrombin Time: 27.6 seconds — ABNORMAL HIGH (ref 11.6–15.2)

## 2014-03-01 LAB — BASIC METABOLIC PANEL
Anion gap: 15 (ref 5–15)
BUN: 20 mg/dL (ref 6–23)
CO2: 25 mEq/L (ref 19–32)
Calcium: 10.1 mg/dL (ref 8.4–10.5)
Chloride: 101 mEq/L (ref 96–112)
Creatinine, Ser: 1.81 mg/dL — ABNORMAL HIGH (ref 0.50–1.35)
GFR calc Af Amer: 46 mL/min — ABNORMAL LOW (ref >90)
GFR calc non Af Amer: 40 mL/min — ABNORMAL LOW (ref >90)
Glucose, Bld: 95 mg/dL (ref 70–99)
Potassium: 4.3 mEq/L (ref 3.7–5.3)
Sodium: 141 mEq/L (ref 137–147)

## 2014-03-01 LAB — VANCOMYCIN, TROUGH: Vancomycin Tr: 43.8 ug/mL (ref 10.0–20.0)

## 2014-03-01 MED ORDER — POLYETHYLENE GLYCOL 3350 17 G PO PACK
17.0000 g | PACK | Freq: Every day | ORAL | Status: DC | PRN
  Filled 2014-03-01: qty 1

## 2014-03-01 NOTE — Progress Notes (Signed)
ANTIBIOTIC CONSULT NOTE - INITIAL  Pharmacy Consult for Vancomycin Indication: Sternal wound infection  No Known Allergies  Patient Measurements: Height: 5\' 10"  (177.8 cm) Weight: 177 lb 8 oz (80.513 kg) IBW/kg (Calculated) : 73 Adjusted Body Weight:   Vital Signs: Temp: 98 F (36.7 C) (11/07 0558) Temp Source: Oral (11/07 0558) BP: 105/62 mmHg (11/07 0558) Pulse Rate: 78 (11/07 0558) Intake/Output from previous day: 11/06 0701 - 11/07 0700 In: 1570 [P.O.:960; I.V.:10; IV Piggyback:600] Out: 10 [Drains:10] Intake/Output from this shift: Total I/O In: 240 [P.O.:240] Out: -   Labs:  Recent Labs  03/01/14 1145  CREATININE 1.81*   Estimated Creatinine Clearance: 46.5 mL/min (by C-G formula based on Cr of 1.81).  Recent Labs  03/01/14 1145  VANCOTROUGH 43.8*     Microbiology: Recent Results (from the past 720 hour(s))  Culture, routine-abscess     Status: None   Collection Time: 02/19/14  4:15 PM  Result Value Ref Range Status   Gram Stain Abundant  Final   Gram Stain WBC present-predominately PMN  Final   Gram Stain No Squamous Epithelial Cells Seen  Final   Gram Stain No Organisms Seen  Final   Organism ID, Bacteria NO GROWTH 3 DAYS  Final  Surgical PCR screen     Status: None   Collection Time: 02/21/14  9:47 PM  Result Value Ref Range Status   MRSA, PCR NEGATIVE NEGATIVE Final   Staphylococcus aureus NEGATIVE NEGATIVE Final    Comment:        The Xpert SA Assay (FDA approved for NASAL specimens in patients over 57 years of age), is one component of a comprehensive surveillance program.  Test performance has been validated by [facility] for patients greater than or equal to 57 year old. It is not intended to diagnose infection nor to guide or monitor treatment.  Wound culture     Status: None   Collection Time: 02/22/14  8:19 AM  Result Value Ref Range Status   Specimen Description WOUND CHEST  Final   Special Requests PATIENT ON FOLLOWING  ZINACEF  Final   Gram Stain   Final    NO WBC SEEN NO SQUAMOUS EPITHELIAL CELLS SEEN NO ORGANISMS SEEN Performed at [facility]    Culture   Final    NO GROWTH 2 DAYS Performed at [facility]    Report Status 02/25/2014 FINAL  Final  Anaerobic culture     Status: None   Collection Time: 02/22/14  8:19 AM  Result Value Ref Range Status   Specimen Description WOUND CHEST  Final   Special Requests PATIENT ON FOLLOWING ZINACEF  Final   Gram Stain   Final    FEW WBC PRESENT, PREDOMINANTLY MONONUCLEAR NO SQUAMOUS EPITHELIAL CELLS SEEN NO ORGANISMS SEEN Performed at [facility]    Culture   Final    NO ANAEROBES ISOLATED Performed at [facility]    Report Status 02/27/2014 FINAL  Final    Medical History: Past Medical History  Diagnosis Date  . Chest pain, atypical   . Hypertension   . Hyperlipidemia   . Chronic anticoagulation   . S/P AVR     for endocarditis  . Ascending aortic aneurysm 01/07/2014  . Anxiety   . Depression   . Asthma     Medications:  Prescriptions prior to admission  Medication Sig Dispense Refill Last Dose  . albuterol (PROVENTIL HFA;VENTOLIN HFA) 108 (90 BASE) MCG/ACT inhaler Inhale 2 puffs into the  lungs every 6 (six) hours as needed for wheezing or shortness of breath.   Past Month at Unknown time  . ALPRAZolam (XANAX) 0.5 MG tablet Take 0.5 mg by mouth daily as needed for anxiety.    02/20/2014 at Unknown time  . amiodarone (PACERONE) 200 MG tablet Take 200 mg by mouth daily.   02/21/2014 at Unknown time  . amoxicillin (AMOXIL) 500 MG capsule    02/21/2014 at Unknown time  . diphenhydramine-acetaminophen (TYLENOL PM) 25-500 MG TABS Take 1 tablet by mouth at bedtime as needed (sleep).   02/20/2014 at Unknown time  . ezetimibe-simvastatin (VYTORIN) 10-20 MG per tablet Take 1 tablet by mouth at bedtime.    Past Week at Unknown time  . metoprolol tartrate (LOPRESSOR) 25 MG tablet Take 12.5 mg by mouth 2 (two) times  daily.   02/21/2014 at 1200  . oxyCODONE (OXY IR/ROXICODONE) 5 MG immediate release tablet    02/21/2014 at 1100  . polyethylene glycol (MIRALAX / GLYCOLAX) packet Take 17 g by mouth 2 (two) times daily. 24 each 0 Past Month at Unknown time  . warfarin (COUMADIN) 5 MG tablet Take 2.5-5 mg by mouth daily. Take 2.5mg  on all other days ( Mon, Tues, Wed, Fri, & Sat) Take 5mg  on Sunday & Thursday ONLY   02/20/2014 at Unknown time   Assessment: 58yom on Vancomycin and Zosyn Day 8 for sternal wound infection. Pharmacy requested a Vancomycin trough today as patient had not had a steady state Vancomycin level yet drawn. Vancomycin trough was supratherapeutic at 43.8 mcg/ml. SCr was also checked and it increased from ~1.1 to 1.81. Pharmacy will assume Vancomycin dosing per discussion with TCTS PA Collins. Will plan to hold Vancomycin for now and follow-up AM random level. Per Dr. Donata ClayVan Trigt notes, current discharge plans include IV Vancomycin and Augmentin PO.  - Afebrile, WBC ~12, no culture growth - Crcl 47 ml/min - Weight 81kg  Goal of Therapy:  Vancomycin trough level 15-20 mcg/ml  Plan:  1. Hold Vancomycin - check random level with AM labs 2. Continue Zosyn 3.375g IV q8h- 4 hr infusion 3. Monitor renal function and UOP  Cleon DewDulaney, [place]  [phone] 03/01/2014,2:04 PM

## 2014-03-01 NOTE — Progress Notes (Addendum)
       [address]       [place] [id number]             [phone]          7 Days Post-Op Procedure(s) (LRB): IRRIGATION AND DEBRIDEMENT EXTREMITY-CHEST (N/A) APPLICATION OF WOUND VAC (N/A)  Subjective: Complaining of continued frequent loose stools.  Wound VAC developed "air bubble" overnight and additional VAC applied.   Objective: Vital signs in last 24 hours: Patient Vitals for the past 24 hrs:  BP Temp Temp src Pulse Resp SpO2 Weight  03/01/14 0558 105/62 mmHg 98 F (36.7 C) Oral 78 18 94 % 177 lb 8 oz (80.513 kg)  02/28/14 2007 121/66 mmHg 98.7 F (37.1 C) Oral 80 18 95 % -  02/28/14 1447 (!) 106/59 mmHg 98.3 F (36.8 C) Oral 85 18 98 % -   Current Weight  03/01/14 177 lb 8 oz (80.513 kg)     Intake/Output from previous day: 11/06 0701 - 11/07 0700 In: 1570 [P.O.:960; I.V.:10; IV Piggyback:600] Out: 10 [Drains:10]    PHYSICAL EXAM:  Heart: RRR Lungs: Clear Wound: VAC in place Extremities: No edema    Lab Results: CBC:No results for input(s): WBC, HGB, HCT, PLT in the last 72 hours. BMET: No results for input(s): NA, K, CL, CO2, GLUCOSE, BUN, CREATININE, CALCIUM in the last 72 hours.  PT/INR:  Recent Labs  03/01/14 0545  LABPROT 27.6*  INR 2.55*      Assessment/Plan: S/P Procedure(s) (LRB): IRRIGATION AND DEBRIDEMENT EXTREMITY-CHEST (N/A) APPLICATION OF WOUND VAC (N/A)  CV- SR, BPs stable. Continue current meds. INR therapeutic.  Sternal wound infection- Continue IV Vanc/Zosyn, additional wound VAC placed last night. Continue current care.  GI- pt reports frequent loose stools over past 3 days despite d/c of scheduled laxatives, stool softeners.  Since he is on broad spectrum IV abx, will check for C diff.    LOS: 8 days    COLLINS,GINA H 03/01/2014  I have seen and examined [name] [name] and agree with the above assessment  and plan.  Delight OvensEdward B Vernel Donlan MD [facility] [phone] Office [phone] 03/01/2014 11:26  AM

## 2014-03-01 NOTE — Progress Notes (Signed)
April R.N. With Rapid response here to put another vac machine to lower part of chest incision. Patient tol. well and suction applied at 125 mmg. Cont. To monitor dsg.

## 2014-03-01 NOTE — Plan of Care (Signed)
Problem: Phase II Progression Outcomes Goal: Discharge plan established Outcome: Completed/Met Date Met:  03/01/14     

## 2014-03-02 LAB — VANCOMYCIN, RANDOM: Vancomycin Rm: 24.5 ug/mL

## 2014-03-02 LAB — BASIC METABOLIC PANEL
Anion gap: 15 (ref 5–15)
BUN: 21 mg/dL (ref 6–23)
CO2: 24 mEq/L (ref 19–32)
Calcium: 10.1 mg/dL (ref 8.4–10.5)
Chloride: 102 mEq/L (ref 96–112)
Creatinine, Ser: 1.96 mg/dL — ABNORMAL HIGH (ref 0.50–1.35)
GFR calc Af Amer: 42 mL/min — ABNORMAL LOW (ref >90)
GFR calc non Af Amer: 36 mL/min — ABNORMAL LOW (ref >90)
Glucose, Bld: 112 mg/dL — ABNORMAL HIGH (ref 70–99)
Potassium: 4 mEq/L (ref 3.7–5.3)
Sodium: 141 mEq/L (ref 137–147)

## 2014-03-02 LAB — PROTIME-INR
INR: 2.4 — ABNORMAL HIGH (ref 0.00–1.49)
Prothrombin Time: 26.3 seconds — ABNORMAL HIGH (ref 11.6–15.2)

## 2014-03-02 MED ORDER — VANCOMYCIN HCL 10 G IV SOLR
1250.0000 mg | INTRAVENOUS | Status: DC
  Administered 2014-03-02: 1250 mg via INTRAVENOUS
  Filled 2014-03-02: qty 1250

## 2014-03-02 NOTE — Progress Notes (Signed)
ANTIBIOTIC CONSULT NOTE - FOLLOW UP  Pharmacy Consult for Vancomycin Indication: Sternal wound infection  No Known Allergies  Patient Measurements: Height: 5\' 10"  (177.8 cm) Weight: 177 lb 8 oz (80.513 kg) IBW/kg (Calculated) : 73 Adjusted Body Weight:   Vital Signs: Temp: 97.9 F (36.6 C) (11/08 0401) Temp Source: Axillary (11/08 0401) BP: 108/67 mmHg (11/08 0401) Pulse Rate: 73 (11/08 0401) Intake/Output from previous day: 11/07 0701 - 11/08 0700 In: 720 [P.O.:720] Out: -  Intake/Output from this shift: Total I/O In: 250 [P.O.:240; I.V.:10] Out: -   Labs:  Recent Labs  03/01/14 1145 03/02/14 0435  CREATININE 1.81* 1.96*   Estimated Creatinine Clearance: 42.9 mL/min (by C-G formula based on Cr of 1.96).  Recent Labs  03/01/14 1145 03/02/14 0435  VANCOTROUGH 43.8*  --   VANCORANDOM  --  24.5     Microbiology: Recent Results (from the past 720 hour(s))  Culture, routine-abscess     Status: None   Collection Time: 02/19/14  4:15 PM  Result Value Ref Range Status   Gram Stain Abundant  Final   Gram Stain WBC present-predominately PMN  Final   Gram Stain No Squamous Epithelial Cells Seen  Final   Gram Stain No Organisms Seen  Final   Organism ID, Bacteria NO GROWTH 3 DAYS  Final  Surgical PCR screen     Status: None   Collection Time: 02/21/14  9:47 PM  Result Value Ref Range Status   MRSA, PCR NEGATIVE NEGATIVE Final   Staphylococcus aureus NEGATIVE NEGATIVE Final    Comment:        The Xpert SA Assay (FDA approved for NASAL specimens in patients over 57 years of age), is one component of a comprehensive surveillance program.  Test performance has been validated by [facility] for patients greater than or equal to 131 year old. It is not intended to diagnose infection nor to guide or monitor treatment.  Wound culture     Status: None   Collection Time: 02/22/14  8:19 AM  Result Value Ref Range Status   Specimen Description WOUND CHEST  Final   Special Requests PATIENT ON FOLLOWING ZINACEF  Final   Gram Stain   Final    NO WBC SEEN NO SQUAMOUS EPITHELIAL CELLS SEEN NO ORGANISMS SEEN Performed at [facility]    Culture   Final    NO GROWTH 2 DAYS Performed at [facility]    Report Status 02/25/2014 FINAL  Final  Anaerobic culture     Status: None   Collection Time: 02/22/14  8:19 AM  Result Value Ref Range Status   Specimen Description WOUND CHEST  Final   Special Requests PATIENT ON FOLLOWING ZINACEF  Final   Gram Stain   Final    FEW WBC PRESENT, PREDOMINANTLY MONONUCLEAR NO SQUAMOUS EPITHELIAL CELLS SEEN NO ORGANISMS SEEN Performed at [facility]    Culture   Final    NO ANAEROBES ISOLATED Performed at [facility]    Report Status 02/27/2014 FINAL  Final    Anti-infectives    Start     Dose/Rate Route Frequency Ordered Stop   02/24/14 1000  vancomycin (VANCOCIN) 1,500 mg in sodium chloride 0.9 % 500 mL IVPB  Status:  Discontinued     1,500 mg250 mL/hr over 120 Minutes Intravenous 2 times daily 02/24/14 0826 03/01/14 1403   02/22/14 0807  vancomycin (VANCOCIN) 1,000 mg in sodium chloride 0.9 % 1,000 mL irrigation  Status:  Discontinued  As needed 02/22/14 0807 02/22/14 0851   02/22/14 0745  vancomycin (VANCOCIN) 1,000 mg in sodium chloride 0.9 % 1,000 mL irrigation  Status:  Discontinued      Irrigation To Surgery 02/22/14 0742 02/22/14 1009   02/21/14 2359  vancomycin (VANCOCIN) IVPB 1000 mg/200 mL premix  Status:  Discontinued     1,000 mg200 mL/hr over 60 Minutes Intravenous 2 times daily 02/21/14 2247 02/24/14 0826   02/21/14 2345  piperacillin-tazobactam (ZOSYN) IVPB 3.375 g     3.375 g12.5 mL/hr over 240 Minutes Intravenous 3 times per day 02/21/14 2302     02/21/14 2300  piperacillin-tazobactam (ZOSYN) IVPB 3.375 g  Status:  Discontinued     3.375 g12.5 mL/hr over 240 Minutes Intravenous 4 times per day 02/21/14 2251 02/21/14 2302   02/21/14 1815  cefUROXime  (ZINACEF) 1.5 g in dextrose 5 % 50 mL IVPB     1.5 g100 mL/hr over 30 Minutes Intravenous 60 min pre-op 02/21/14 1800 02/22/14 0817      Assessment: 58yom on Vancomycin and Zosyn Day 9 for sternal wound infection. Vancomycin trough yesterday was supratherapeutic at 43.8 - Vancomycin was held and follow-up random level was checked this AM which had decreased to 24.5 (ke 0.0345, t1/2 ~20hrs). Will plan to adjust Vancomycin to 1.25g IV q24h (expected trough ~17 mcg/ml) and monitor renal function. - SCr continues to trend down (0.96-->1.81-->1.96) - most likely due to Vancomycin and Zosyn effects - Wt 80.5kg  Goal of Therapy:  Vancomycin trough level 15-20 mcg/ml  Plan:  1. Change Vancomycin to 1.25g IV q24h - start @ 1600 today 2. Monitor renal function, UOP and check follow-up Vancomycin trough prior to 4th dose  3. Continue Zosyn 3.375g IV q8h- 4 hr infusion   Cleon DewDulaney, [place]  [phone] 03/02/2014,12:44 PM

## 2014-03-02 NOTE — Progress Notes (Addendum)
       [address]       [place] [id number]             [phone]          8 Days Post-Op Procedure(s) (LRB): IRRIGATION AND DEBRIDEMENT EXTREMITY-CHEST (N/A) APPLICATION OF WOUND VAC (N/A)  Subjective: Stable, no new complaints. Loose stools almost completely resolved.   Objective: Vital signs in last 24 hours: Patient Vitals for the past 24 hrs:  BP Temp Temp src Pulse Resp SpO2  03/02/14 0401 108/67 mmHg 97.9 F (36.6 C) Axillary 73 18 95 %  03/01/14 2102 131/74 mmHg 97.8 F (36.6 C) Oral 85 18 96 %  03/01/14 1350 127/70 mmHg 97.7 F (36.5 C) Oral 76 20 95 %   Current Weight  03/01/14 177 lb 8 oz (80.513 kg)     Intake/Output from previous day: 11/07 0701 - 11/08 0700 In: 720 [P.O.:720] Out: -     PHYSICAL EXAM:  Heart: RRR Lungs: Clear Wound: Vac in place, no surrounding erythema Extremities: Warm, well perfused    Lab Results: CBC:No results for input(s): WBC, HGB, HCT, PLT in the last 72 hours. BMET:  Recent Labs  03/01/14 1145 03/02/14 0435  NA 141 141  K 4.3 4.0  CL 101 102  CO2 25 24  GLUCOSE 95 112*  BUN 20 21  CREATININE 1.81* 1.96*  CALCIUM 10.1 10.1    PT/INR:  Recent Labs  03/02/14 0435  LABPROT 26.3*  INR 2.40*      Assessment/Plan: S/P Procedure(s) (LRB): IRRIGATION AND DEBRIDEMENT EXTREMITY-CHEST (N/A) APPLICATION OF WOUND VAC (N/A)  CV- SR, BPs stable. Continue current meds. INR therapeutic.  Sternal wound infection- Continue IV Vanc/Zosyn, wound VAC. For VAC change in am. Cx's negative.  GI- loose stools improving.  Will leave off all laxatives, stool softeners, etc for now.  C diff ordered, but not sent.  Cr elevated. May be related to abx. Spoke with pharmacy yesterday regarding dosing, and they will be dosing Vanc for renal function.  Not on any other meds that should affect renal function and no prior h/o renal issues.  Will follow closely.  Possibly home in am.    LOS: 9 days     COLLINS,GINA H 03/02/2014  Acute renal insufficiency likely related to vanco Pharmacy adjusting dose  check labs in am Wound dressing change tomorrow I have seen and examined Niel Hummerimothy M Toren and agree with the above assessment  and plan.  Delight OvensEdward B Jeralynn Vaquera MD [facility] [phone] Office [phone] 03/02/2014 10:10 AM

## 2014-03-03 LAB — CBC
HCT: 33 % — ABNORMAL LOW (ref 39.0–52.0)
Hemoglobin: 10.5 g/dL — ABNORMAL LOW (ref 13.0–17.0)
MCH: 25.7 pg — ABNORMAL LOW (ref 26.0–34.0)
MCHC: 31.8 g/dL (ref 30.0–36.0)
MCV: 80.9 fL (ref 78.0–100.0)
PLATELETS: 322 10*3/uL (ref 150–400)
RBC: 4.08 MIL/uL — AB (ref 4.22–5.81)
RDW: 16.2 % — ABNORMAL HIGH (ref 11.5–15.5)
WBC: 12.1 10*3/uL — AB (ref 4.0–10.5)

## 2014-03-03 LAB — BASIC METABOLIC PANEL
Anion gap: 15 (ref 5–15)
BUN: 25 mg/dL — ABNORMAL HIGH (ref 6–23)
CO2: 24 mEq/L (ref 19–32)
Calcium: 10.4 mg/dL (ref 8.4–10.5)
Chloride: 103 mEq/L (ref 96–112)
Creatinine, Ser: 1.98 mg/dL — ABNORMAL HIGH (ref 0.50–1.35)
GFR calc non Af Amer: 36 mL/min — ABNORMAL LOW (ref >90)
GFR, EST AFRICAN AMERICAN: 41 mL/min — AB (ref >90)
GLUCOSE: 102 mg/dL — AB (ref 70–99)
Potassium: 4.1 mEq/L (ref 3.7–5.3)
SODIUM: 142 meq/L (ref 137–147)

## 2014-03-03 LAB — CLOSTRIDIUM DIFFICILE BY PCR: CDIFFPCR: NEGATIVE

## 2014-03-03 LAB — PROTIME-INR
INR: 2.17 — ABNORMAL HIGH (ref 0.00–1.49)
Prothrombin Time: 24.4 seconds — ABNORMAL HIGH (ref 11.6–15.2)

## 2014-03-03 MED ORDER — WARFARIN SODIUM 5 MG PO TABS
5.0000 mg | ORAL_TABLET | Freq: Every day | ORAL | Status: DC
  Filled 2014-03-03: qty 1

## 2014-03-03 MED ORDER — KCL IN DEXTROSE-NACL 20-5-0.9 MEQ/L-%-% IV SOLN
INTRAVENOUS | Status: DC
  Administered 2014-03-03 – 2014-03-05 (×2): via INTRAVENOUS
  Filled 2014-03-03 (×4): qty 1000

## 2014-03-03 MED ORDER — WARFARIN SODIUM 2.5 MG PO TABS
2.5000 mg | ORAL_TABLET | Freq: Every day | ORAL | Status: DC
  Administered 2014-03-03 – 2014-03-04 (×2): 2.5 mg via ORAL
  Filled 2014-03-03 (×3): qty 1

## 2014-03-03 MED ORDER — TRAMADOL HCL 50 MG PO TABS: 50.0000 mg | ORAL_TABLET | Freq: Two times a day (BID) | ORAL | Status: DC | PRN

## 2014-03-03 MED ORDER — METRONIDAZOLE 250 MG PO TABS
250.0000 mg | ORAL_TABLET | Freq: Three times a day (TID) | ORAL | Status: DC
  Administered 2014-03-03 (×3): 250 mg via ORAL
  Filled 2014-03-03 (×6): qty 1

## 2014-03-03 NOTE — Consult Note (Signed)
WOC wound follow up Dr. Donata ClayVan Trigt at bedside to assess the wound Wound type: surgical  Measurement: Wound bed: clean, 100% granulation tissue Drainage (amount, consistency, odor) moderate amount in the canister, serosanguinous  Periwound: intact  Dressing procedure/placement/frequency: 1pc of black foam used to fill the wound bed, seal at 125mm HG. Pt tolerated well, did not require pain meds with dressing change today.   WOC will follow along with your for NPWT VAC dressing changes. Irving Lubbers BaldwinAustin RN, [place] [phone]

## 2014-03-03 NOTE — Progress Notes (Addendum)
      [address]       [place], [id number]             [phone]        9 Days Post-Op Procedure(s) (LRB): IRRIGATION AND DEBRIDEMENT EXTREMITY-CHEST (N/A) APPLICATION OF WOUND VAC (N/A)  Subjective: Patient still with loose stools  Objective: Vital signs in last 24 hours: Temp:  [97.9 F (36.6 C)-98.4 F (36.9 C)] 98.4 F (36.9 C) (11/09 0449) Pulse Rate:  [75-80] 78 (11/09 0449) Cardiac Rhythm:  [-] Normal sinus rhythm;Heart block (11/08 1930) Resp:  [18] 18 (11/09 0449) BP: (104-115)/(62-71) 113/71 mmHg (11/09 0449) SpO2:  [94 %-95 %] 95 % (11/09 0449)   Current Weight  03/01/14 177 lb 8 oz (80.513 kg)      Intake/Output from previous day: 11/08 0701 - 11/09 0700 In: 610 [P.O.:600; I.V.:10] Out: -    Physical Exam:  Cardiovascular: RRR, sharp valve click Pulmonary: Clear to auscultation bilaterally; no rales, wheezes, or rhonchi. Abdomen: Soft, non tender, bowel sounds present. Extremities: Trace extremity edema. Wounds: Wound VAC in place  Lab Results: CBC:  Recent Labs  03/03/14 0430  WBC 12.1*  HGB 10.5*  HCT 33.0*  PLT 322   BMET:   Recent Labs  03/02/14 0435 03/03/14 0430  NA 141 142  K 4.0 4.1  CL 102 103  CO2 24 24  GLUCOSE 112* 102*  BUN 21 25*  CREATININE 1.96* 1.98*  CALCIUM 10.1 10.4    PT/INR:  Lab Results  Component Value Date   INR 2.17* 03/03/2014   INR 2.40* 03/02/2014   INR 2.55* 03/01/2014   ABG:  INR: Will add last result for INR, ABG once components are confirmed Will add last 4 CBG results once components are confirmed  Assessment/Plan:  1. CV - SR. On Amiodarone 200 daily, Lopressor 12.5 bid, and Coumadin. INR decreased from 2.4 to 2.17. Will give Coumadin 5 tonight.Has mechanical valve so INR should be at least 2.4-2.8 2.  Wound-VAC in place.To be changed this am 3.  Acute blood loss anemia - Last H and H stable at 10.9 and 33 this am. 4.ID-On Vanco and Zosyn. Per Dr. Donata ClayVan Trigt,  stop Vancomycin 5. Loose stools-C Dif is pending 6. Per Dr. Donata ClayVan Trigt, IVF at 50 cc 7. Will not discharge today as creatinine elevated and still with loose stools.    ZIMMERMAN,DONIELLE MPA-C 03/03/2014,7:19 AM   Wound personally inspected @ VAC change today Needs better granulation base before transitioning to home care patient examined and medical record reviewed,agree with above note. VAN TRIGT III,[name] [name] 03/03/2014

## 2014-03-04 LAB — BASIC METABOLIC PANEL
Anion gap: 15 (ref 5–15)
BUN: 30 mg/dL — ABNORMAL HIGH (ref 6–23)
CO2: 24 mEq/L (ref 19–32)
Calcium: 10.4 mg/dL (ref 8.4–10.5)
Chloride: 102 mEq/L (ref 96–112)
Creatinine, Ser: 1.96 mg/dL — ABNORMAL HIGH (ref 0.50–1.35)
GFR, EST AFRICAN AMERICAN: 42 mL/min — AB (ref >90)
GFR, EST NON AFRICAN AMERICAN: 36 mL/min — AB (ref >90)
Glucose, Bld: 113 mg/dL — ABNORMAL HIGH (ref 70–99)
POTASSIUM: 3.8 meq/L (ref 3.7–5.3)
SODIUM: 141 meq/L (ref 137–147)

## 2014-03-04 LAB — PROTIME-INR
INR: 2.47 — AB (ref 0.00–1.49)
PROTHROMBIN TIME: 27 s — AB (ref 11.6–15.2)

## 2014-03-04 MED ORDER — LOPERAMIDE HCL 1 MG/5ML PO LIQD
1.0000 mg | ORAL | Status: DC | PRN
  Filled 2014-03-04: qty 5

## 2014-03-04 MED ORDER — DOXYCYCLINE HYCLATE 100 MG PO TABS
100.0000 mg | ORAL_TABLET | Freq: Two times a day (BID) | ORAL | Status: DC
Start: 2014-03-04 — End: 2014-03-05
  Administered 2014-03-04 – 2014-03-05 (×3): 100 mg via ORAL
  Filled 2014-03-04 (×4): qty 1

## 2014-03-04 MED ORDER — POTASSIUM CHLORIDE CRYS ER 20 MEQ PO TBCR
20.0000 meq | EXTENDED_RELEASE_TABLET | Freq: Once | ORAL | Status: AC
  Administered 2014-03-04: 20 meq via ORAL
  Filled 2014-03-04: qty 1

## 2014-03-04 MED ORDER — FE FUMARATE-B12-VIT C-FA-IFC PO CAPS
1.0000 | ORAL_CAPSULE | Freq: Two times a day (BID) | ORAL | Status: DC
  Administered 2014-03-04 (×2): 1 via ORAL
  Filled 2014-03-04 (×5): qty 1

## 2014-03-04 NOTE — Progress Notes (Addendum)
      [address]       [place] [id number]             [phone]        10 Days Post-Op Procedure(s) (LRB): IRRIGATION AND DEBRIDEMENT EXTREMITY-CHEST (N/A) APPLICATION OF WOUND VAC (N/A)  Subjective: Patient still with loose stools  Objective: Vital signs in last 24 hours: Temp:  [97.4 F (36.3 C)-98 F (36.7 C)] 98 F (36.7 C) (11/10 0600) Pulse Rate:  [73-80] 73 (11/10 0600) Cardiac Rhythm:  [-] Heart block (11/09 2218) Resp:  [17-20] 17 (11/10 0600) BP: (109-127)/(64-80) 111/65 mmHg (11/10 0600) SpO2:  [93 %-98 %] 96 % (11/10 0600) Weight:  [173 lb 14.4 oz (78.881 kg)] 173 lb 14.4 oz (78.881 kg) (11/10 0600)   Current Weight  03/04/14 173 lb 14.4 oz (78.881 kg)      Intake/Output from previous day: 11/09 0701 - 11/10 0700 In: 360 [P.O.:360] Out: 25 [Drains:25]   Physical Exam:  Cardiovascular: RRR, sharp valve click Pulmonary: Clear to auscultation bilaterally; no rales, wheezes, or rhonchi. Abdomen: Soft, non tender, bowel sounds present. Extremities: Trace extremity edema. Wounds: Wound VAC in place  Lab Results: CBC:  Recent Labs  03/03/14 0430  WBC 12.1*  HGB 10.5*  HCT 33.0*  PLT 322   BMET:   Recent Labs  03/03/14 0430 03/04/14 0350  NA 142 141  K 4.1 3.8  CL 103 102  CO2 24 24  GLUCOSE 102* 113*  BUN 25* 30*  CREATININE 1.98* 1.96*  CALCIUM 10.4 10.4    PT/INR:  Lab Results  Component Value Date   INR 2.47* 03/04/2014   INR 2.17* 03/03/2014   INR 2.40* 03/02/2014   ABG:  INR: Will add last result for INR, ABG once components are confirmed Will add last 4 CBG results once components are confirmed  Assessment/Plan:  1. CV - SR. On Amiodarone 200 daily, Lopressor 12.5 bid, and Coumadin. INR increased from 2.17 to 2.47. Will give Coumadin 2.5 tonight. 2.  Wound-VAC in place.To be changed this am 3.  Acute blood loss anemia - Last H and H stable at 10.9 and 33 this am. 4.ID-On Zosyn.  5. Loose  stools-C Dif is negative. On oral Flagyl. Will discuss with Dr. Donata ClayVan Trigt if should stop Flagyl. Immodium PRN 6. Per Dr. Donata ClayVan Trigt, IVF started at 50 cc yesterday. Will discuss when to discontinue with Dr. Donata ClayVan Trigt. 7. Creatinine slightly decreased to 1.96. Re check in am. Vancomycin stopped yesterday. 8. Gently supplement potassium   ZIMMERMAN,DONIELLE MPA-C 03/04/2014,7:23 AM   Cont iv fluid 50/hr Stop flagyl Change wound VAC in am Home on po Augmentin, doxycycline  If wound is better tomorrow  patient examined and medical record reviewed,agree with above note. VAN TRIGT III,PETER 03/04/2014

## 2014-03-05 ENCOUNTER — Ambulatory Visit: Payer: Self-pay | Admitting: Cardiothoracic Surgery

## 2014-03-05 LAB — BASIC METABOLIC PANEL
ANION GAP: 12 (ref 5–15)
BUN: 26 mg/dL — ABNORMAL HIGH (ref 6–23)
CALCIUM: 10.1 mg/dL (ref 8.4–10.5)
CO2: 25 mEq/L (ref 19–32)
CREATININE: 1.96 mg/dL — AB (ref 0.50–1.35)
Chloride: 105 mEq/L (ref 96–112)
GFR calc non Af Amer: 36 mL/min — ABNORMAL LOW (ref >90)
GFR, EST AFRICAN AMERICAN: 42 mL/min — AB (ref >90)
Glucose, Bld: 109 mg/dL — ABNORMAL HIGH (ref 70–99)
Potassium: 4 mEq/L (ref 3.7–5.3)
Sodium: 142 mEq/L (ref 137–147)

## 2014-03-05 LAB — PROTIME-INR
INR: 2.53 — ABNORMAL HIGH (ref 0.00–1.49)
PROTHROMBIN TIME: 27.5 s — AB (ref 11.6–15.2)

## 2014-03-05 MED ORDER — ASPIRIN 81 MG PO TBEC: 81.0000 mg | DELAYED_RELEASE_TABLET | Freq: Every day | ORAL | Status: DC

## 2014-03-05 MED ORDER — DOXYCYCLINE HYCLATE 100 MG PO TABS: 100.0000 mg | ORAL_TABLET | Freq: Two times a day (BID) | ORAL | Status: DC

## 2014-03-05 MED ORDER — LOPERAMIDE HCL 1 MG/5ML PO LIQD: 1.0000 mg | ORAL | Status: DC | PRN

## 2014-03-05 MED ORDER — WARFARIN SODIUM 5 MG PO TABS: 2.5000 mg | ORAL_TABLET | Freq: Every day | ORAL | Status: DC

## 2014-03-05 MED ORDER — OXYCODONE HCL 5 MG PO TABS: 5.0000 mg | ORAL_TABLET | ORAL | Status: DC | PRN

## 2014-03-05 MED ORDER — FE FUMARATE-B12-VIT C-FA-IFC PO CAPS: 1.0000 | ORAL_CAPSULE | Freq: Every day | ORAL | Status: DC

## 2014-03-05 MED ORDER — AMOXICILLIN-POT CLAVULANATE 875-125 MG PO TABS: 1.0000 | ORAL_TABLET | Freq: Two times a day (BID) | ORAL | Status: DC

## 2014-03-05 NOTE — Progress Notes (Addendum)
      [address]       [place] [id number]             [phone]        11 Days Post-Op Procedure(s) (LRB): IRRIGATION AND DEBRIDEMENT EXTREMITY-CHEST (N/A) APPLICATION OF WOUND VAC (N/A)  Subjective: Patient with less loose stools  Objective: Vital signs in last 24 hours: Temp:  [98 F (36.7 C)-98.3 F (36.8 C)] 98 F (36.7 C) (11/11 0506) Pulse Rate:  [71-79] 74 (11/11 0506) Cardiac Rhythm:  [-] Normal sinus rhythm;Heart block;Bundle branch block (11/10 1910) Resp:  [18-20] 18 (11/11 0506) BP: (109-116)/(64-74) 109/64 mmHg (11/11 0506) SpO2:  [93 %-99 %] 93 % (11/11 0506)   Current Weight  03/04/14 173 lb 14.4 oz (78.881 kg)      Intake/Output from previous day: 11/10 0701 - 11/11 0700 In: 990 [P.O.:840; IV Piggyback:150] Out: -    Physical Exam:  Cardiovascular: RRR, sharp valve click Pulmonary: Clear to auscultation bilaterally; no rales, wheezes, or rhonchi. Abdomen: Soft, non tender, bowel sounds present. Extremities: Trace extremity edema. Wounds: Wound VAC in place  Lab Results: CBC:  Recent Labs  03/03/14 0430  WBC 12.1*  HGB 10.5*  HCT 33.0*  PLT 322   BMET:   Recent Labs  03/04/14 0350 03/05/14 0520  NA 141 142  K 3.8 4.0  CL 102 105  CO2 24 25  GLUCOSE 113* 109*  BUN 30* 26*  CREATININE 1.96* 1.96*  CALCIUM 10.4 10.1    PT/INR:  Lab Results  Component Value Date   INR 2.53* 03/05/2014   INR 2.47* 03/04/2014   INR 2.17* 03/03/2014   ABG:  INR: Will add last result for INR, ABG once components are confirmed Will add last 4 CBG results once components are confirmed  Assessment/Plan:  1. CV - SR. On Amiodarone 200 daily, Lopressor 12.5 bid, and Coumadin. INR increased from 2.47 to 2.53. Continue with Coumadin 2.5. 2.  Wound-VAC in place.To be changed this am 3.  Acute blood loss anemia - Last H and H stable at 10.9 and 33 this am. 4.ID-On Zosyn and Doxycycline. Per Dr. Donata ClayVan Trigt, change to  Augmentin and Doxycycline orally at discharge. 5. Loose stools-C Dif is negative.Immodium PRN 7. Creatinine remains 1.96.  Vancomycin stopped Monday. 8. Will discuss disposition with Dr. Donata ClayVan Trigt   ZIMMERMAN,DONIELLE MPA-C 03/05/2014,7:30 AM    Home on coumadin 2.5 daily HHN to draw INR in 48 hrs - [facility]  patient examined and medical record reviewed,agree with above note. VAN TRIGT III,PETER 03/05/2014

## 2014-03-05 NOTE — Progress Notes (Signed)
Discharge instructions completed. Medication list explained. Scripts given to patient. Wound vac tubing connected to home wound vac machine. Explained to patient how to clamp and unclamp tubing. Patient verbalized understanding of discharge instructions.

## 2014-03-05 NOTE — Discharge Instructions (Signed)
Home health to change wound vac every Monday, Wednesday, and Friday.  Activity: 1.May walk up steps                2.No lifting more than ten pounds for four weeks.                 3.No driving for two weeks.                4.Stop any activity that causes chest pain, shortness of breath, dizziness, sweating or excessive weakness.                5.Avoid straining.                6.Continue with your breathing exercises daily.  Diet: Low fat, Low salt diet  Wound Care: May sponge bath only  No chiropractic manipulation until instructed otherwise.

## 2014-03-05 NOTE — Consult Note (Signed)
WOC wound follow up Wound type: surgical  Measurement:17cm x 2.5cm x 0.5cm  Wound bed: clean, 100% granulation tissue Drainage (amount, consistency, odor) moderate, serosanguinous in canister Periwound: intact Dressing procedure/placement/frequency: 1pc of black granufoam placed in the wound bed, seal at 125mmHG.  Demonstrated VAC canister placement on the home unit to the patient.  Reviewed the contents of the home vac unit.  Explain HHRN role and when changes would occur M/W/F.  Pt verbalized understanding.    Harlo Fabela OrlandAustin RN,CWOCN [phone]

## 2014-03-07 ENCOUNTER — Ambulatory Visit (INDEPENDENT_AMBULATORY_CARE_PROVIDER_SITE_OTHER): Payer: BC Managed Care – PPO | Admitting: *Deleted

## 2014-03-07 DIAGNOSIS — Z954 Presence of other heart-valve replacement: Secondary | ICD-10-CM

## 2014-03-07 DIAGNOSIS — I480 Paroxysmal atrial fibrillation: Secondary | ICD-10-CM

## 2014-03-07 DIAGNOSIS — Z952 Presence of prosthetic heart valve: Secondary | ICD-10-CM

## 2014-03-07 LAB — POCT INR: INR: 3.1

## 2014-03-10 ENCOUNTER — Ambulatory Visit: Payer: Self-pay | Admitting: *Deleted

## 2014-03-10 ENCOUNTER — Telehealth: Payer: Self-pay | Admitting: *Deleted

## 2014-03-10 DIAGNOSIS — Z952 Presence of prosthetic heart valve: Secondary | ICD-10-CM

## 2014-03-10 DIAGNOSIS — I480 Paroxysmal atrial fibrillation: Secondary | ICD-10-CM

## 2014-03-11 ENCOUNTER — Other Ambulatory Visit: Payer: Self-pay | Admitting: Cardiothoracic Surgery

## 2014-03-11 DIAGNOSIS — I712 Thoracic aortic aneurysm, without rupture: Secondary | ICD-10-CM

## 2014-03-11 DIAGNOSIS — I7121 Aneurysm of the ascending aorta, without rupture: Secondary | ICD-10-CM

## 2014-03-12 ENCOUNTER — Encounter: Payer: Self-pay | Admitting: Cardiothoracic Surgery

## 2014-03-12 ENCOUNTER — Ambulatory Visit (INDEPENDENT_AMBULATORY_CARE_PROVIDER_SITE_OTHER): Payer: Self-pay | Admitting: Cardiothoracic Surgery

## 2014-03-12 ENCOUNTER — Ambulatory Visit
Admission: RE | Admit: 2014-03-12 | Discharge: 2014-03-12 | Disposition: A | Discharge status: Home / Self Care | Payer: BC Managed Care – PPO | Source: Ambulatory Visit | Attending: Cardiothoracic Surgery | Admitting: Cardiothoracic Surgery

## 2014-03-12 VITALS — BP 126/83 | HR 74 | Resp 16 | Ht 70.0 in | Wt 183.0 lb

## 2014-03-12 DIAGNOSIS — Z952 Presence of prosthetic heart valve: Secondary | ICD-10-CM

## 2014-03-12 DIAGNOSIS — T814XXD Infection following a procedure, subsequent encounter: Secondary | ICD-10-CM

## 2014-03-12 DIAGNOSIS — I712 Thoracic aortic aneurysm, without rupture: Secondary | ICD-10-CM

## 2014-03-12 DIAGNOSIS — Z954 Presence of other heart-valve replacement: Secondary | ICD-10-CM

## 2014-03-12 DIAGNOSIS — I7121 Aneurysm of the ascending aorta, without rupture: Secondary | ICD-10-CM

## 2014-03-12 DIAGNOSIS — I350 Nonrheumatic aortic (valve) stenosis: Secondary | ICD-10-CM

## 2014-03-12 DIAGNOSIS — IMO0001 Reserved for inherently not codable concepts without codable children: Secondary | ICD-10-CM

## 2014-03-12 NOTE — Progress Notes (Signed)
PCP is Lupe CarneyMITCHELL,DEAN, MD Referring Provider is SwazilandJordan, Jamol Ginyard M, MD  Chief Complaint  Patient presents with  . Routine Post Op    wound vac change to sternal incision    ZOX:WRUEAHPI:wound check and wound VAC change Patient completed a course of oral Augmentin and doxycycline Wound is examined and is 100% clean with granulation tissue-still has significant defect and will need continued wound VAC therapy another week with a wound VAC changed Monday Wednesday Friday by home health  Otherwise patient is doing well. No more diarrhea. Complains of insomnia Walking daily   Past Medical History  Diagnosis Date  . Chest pain, atypical   . Hypertension   . Hyperlipidemia   . Chronic anticoagulation   . S/P AVR     for endocarditis  . Ascending aortic aneurysm 01/07/2014  . Anxiety   . Depression   . Asthma     Past Surgical History  Procedure Laterality Date  . Aortic valve replacement  1988    23mm St. Jude valve  . Doppler echocardiography  06/12/1996    EF 55-60%  . Cardiac catheterization    . Hand surgery    . Bentall procedure N/A 01/23/2014    Procedure: BENTALL PROCEDURE;  Surgeon: Kerin PernaPeter Van Trigt, MD;  [facility];  Service: Open Heart Surgery;  Laterality: N/A;  . Aortic valve replacement N/A 01/23/2014    Procedure: REDO AORTIC VALVE REPLACEMENT (AVR) with 23 St. Jude Mechanical Aortic Valved Graft;  Surgeon: Kerin PernaPeter Van Trigt, MD;  [facility];  Service: Open Heart Surgery;  Laterality: N/A;  . Intraoperative transesophageal echocardiogram N/A 01/23/2014    Procedure: INTRAOPERATIVE TRANSESOPHAGEAL ECHOCARDIOGRAM;  Surgeon: Kerin PernaPeter Van Trigt, MD;  [facility];  Service: Open Heart Surgery;  Laterality: N/A;  . I&d extremity N/A 02/22/2014    Procedure: IRRIGATION AND DEBRIDEMENT EXTREMITY-CHEST;  Surgeon: Kerin PernaPeter Van Trigt, MD;  [facility];  Service: Vascular;  Laterality: N/A;  PULSE LAVAGE  . Application of wound vac N/A 02/22/2014    Procedure: APPLICATION OF WOUND  VAC;  Surgeon: Kerin PernaPeter Van Trigt, MD;  [facility];  Service: Vascular;  Laterality: N/A;    Family History  Problem Relation Age of Onset  . Cancer Mother   . Healthy Brother   . Healthy Brother     Social History History  Substance Use Topics  . Smoking status: Former Smoker    Quit date: 05/04/1986  . Smokeless tobacco: Never Used  . Alcohol Use: 1.8 oz/week    3 Shots of liquor per week     Comment: daily    Current Outpatient Prescriptions  Medication Sig Dispense Refill  . albuterol (PROVENTIL HFA;VENTOLIN HFA) 108 (90 BASE) MCG/ACT inhaler Inhale 2 puffs into the lungs every 6 (six) hours as needed for wheezing or shortness of breath.    . ALPRAZolam (XANAX) 0.5 MG tablet Take 0.5 mg by mouth daily as needed for anxiety.     Marland Kitchen. amiodarone (PACERONE) 200 MG tablet Take 200 mg by mouth daily.    Marland Kitchen. amoxicillin-clavulanate (AUGMENTIN) 875-125 MG per tablet Take 1 tablet by mouth 2 (two) times daily. 19 tablet 0  . aspirin EC 81 MG EC tablet Take 1 tablet (81 mg total) by mouth daily.    . diphenhydramine-acetaminophen (TYLENOL PM) 25-500 MG TABS Take 1 tablet by mouth at bedtime as needed (sleep).    Marland Kitchen. doxycycline (VIBRA-TABS) 100 MG tablet Take 1 tablet (100 mg total) by mouth every 12 (twelve) hours. 17  tablet 0  . ezetimibe-simvastatin (VYTORIN) 10-20 MG per tablet Take 1 tablet by mouth at bedtime.     . ferrous fumarate-b12-vitamic C-folic acid (TRINSICON / FOLTRIN) capsule Take 1 capsule by mouth daily. For one month then stop. 30 capsule 0  . loperamide (IMODIUM) 1 MG/5ML solution Take 5 mLs (1 mg total) by mouth as needed for diarrhea or loose stools. 120 mL 0  . metoprolol tartrate (LOPRESSOR) 25 MG tablet Take 12.5 mg by mouth 2 (two) times daily.    . polyethylene glycol (MIRALAX / GLYCOLAX) packet Take 17 g by mouth 2 (two) times daily. 24 each 0  . warfarin (COUMADIN) 5 MG tablet Take 0.5 tablets (2.5 mg total) by mouth daily. Take 2.5mg  daily or as directed      No current facility-administered medications for this visit.    No Known Allergies  Review of Systemsno fever, good appetite Complains of depression- insomnia  BP 126/83 mmHg  Pulse 74  Resp 16  Ht 5\' 10"  (1.778 m)  Wt 183 lb (83.008 kg)  BMI 26.26 kg/m2  SpO2 98% Physical Exam Alert and comfortable Lungs clear Heart rate regular Aortic valve closure sounds normal Wound is granulating in nicely without purulence or erythema or cellulitis  Diagnostic Tests: Chest x-rays clear  Impression: Improved and superficial sternal infection  Plan: His course of Augmentin then stop Continue doxycycline 100 mg daily Prescription for Ambien divided L. Patient rested night Followup appointment in one week to examine wound

## 2014-03-14 ENCOUNTER — Ambulatory Visit (INDEPENDENT_AMBULATORY_CARE_PROVIDER_SITE_OTHER): Payer: BC Managed Care – PPO | Admitting: Internal Medicine

## 2014-03-14 DIAGNOSIS — I480 Paroxysmal atrial fibrillation: Secondary | ICD-10-CM

## 2014-03-14 DIAGNOSIS — Z952 Presence of prosthetic heart valve: Secondary | ICD-10-CM

## 2014-03-14 DIAGNOSIS — Z954 Presence of other heart-valve replacement: Secondary | ICD-10-CM

## 2014-03-14 LAB — POCT INR: INR: 2.6

## 2014-03-18 ENCOUNTER — Ambulatory Visit (INDEPENDENT_AMBULATORY_CARE_PROVIDER_SITE_OTHER): Payer: Self-pay | Admitting: Cardiothoracic Surgery

## 2014-03-18 ENCOUNTER — Encounter: Payer: Self-pay | Admitting: Cardiothoracic Surgery

## 2014-03-18 ENCOUNTER — Encounter: Payer: Self-pay | Admitting: Cardiology

## 2014-03-18 ENCOUNTER — Ambulatory Visit (INDEPENDENT_AMBULATORY_CARE_PROVIDER_SITE_OTHER): Payer: BC Managed Care – PPO | Admitting: Cardiology

## 2014-03-18 VITALS — BP 128/78 | HR 81 | Ht 70.0 in | Wt 193.1 lb

## 2014-03-18 VITALS — BP 139/84 | HR 83 | Resp 20 | Ht 70.0 in | Wt 193.0 lb

## 2014-03-18 DIAGNOSIS — Z954 Presence of other heart-valve replacement: Secondary | ICD-10-CM

## 2014-03-18 DIAGNOSIS — I359 Nonrheumatic aortic valve disorder, unspecified: Secondary | ICD-10-CM

## 2014-03-18 DIAGNOSIS — Z952 Presence of prosthetic heart valve: Secondary | ICD-10-CM

## 2014-03-18 DIAGNOSIS — I7121 Aneurysm of the ascending aorta, without rupture: Secondary | ICD-10-CM

## 2014-03-18 DIAGNOSIS — Z7901 Long term (current) use of anticoagulants: Secondary | ICD-10-CM

## 2014-03-18 DIAGNOSIS — IMO0001 Reserved for inherently not codable concepts without codable children: Secondary | ICD-10-CM

## 2014-03-18 DIAGNOSIS — I480 Paroxysmal atrial fibrillation: Secondary | ICD-10-CM

## 2014-03-18 DIAGNOSIS — I712 Thoracic aortic aneurysm, without rupture: Secondary | ICD-10-CM

## 2014-03-18 DIAGNOSIS — T814XXD Infection following a procedure, subsequent encounter: Secondary | ICD-10-CM

## 2014-03-18 NOTE — Progress Notes (Signed)
[name] Hatchet25 BADTEXTTAG>AlbaniaAshok Cordia841 DTEXTTAG>[place]16.1Marland Kitchen [place] shok Cordia84132[place]her[place]nn Polo65mGarlon Hatchet52 [place]16.1Marland Kitchen [place] shok Cordia84132[place]nn Polo25mGarlon Hatchet9 [place]16.1Marland Kitchen [place] [name] Cordia84132[place]uckyCherlynn Polo54mGarlon Hatchet73 [name]16.1Marland Kitchen [place] [name] Cordia84132[place]Cherlynn Polo74mGarlon Hatchet56 [name]16.1Marland Kitchen [place] [name] Cordia84132[place]Cherlynn Polo82mGarlon Hatchet72 [name] Kitchen [place] [name] Cordia84132[place]nn Polo32m15 [name]16.1Marland Kitchen  [place]Cherlynn Polo43mGarlon Hatchet21 [name]16.1Marland Kitchen [place] [name] Cordia84132[place]uckyCherlynn Polo62mGarlon Hatchet65 [place]16.1  mouth at bedtime.     . ferrous fumarate-b12-vitamic C-folic acid (TRINSICON / FOLTRIN) capsule Take 1 capsule by mouth daily. For one month then stop. 30 capsule 0  . loperamide (IMODIUM) 1 MG/5ML solution Take 5 mLs (1 mg total) by mouth as needed for diarrhea or loose stools. 120 mL 0  . metoprolol tartrate (LOPRESSOR) 25 MG tablet Take 12.5 mg by mouth 2 (two) times daily.    . polyethylene glycol (MIRALAX / GLYCOLAX) packet Take 17 g by mouth 2 (two) times daily. 24 each 0  . warfarin (COUMADIN) 5 MG tablet Take 0.5 tablets (2.5 mg total) by mouth daily. Take 2.5mg  daily or as directed     No current facility-administered medications for this visit.    No Known  Allergies  Review of Systemsoverall improvement in sleeping appetite and strength-not taking any meds  BP 139/84 mmHg  Pulse 83  Resp 20  Ht 5\' 10"  (1.778 m)  Wt 193 lb (87.544 kg)  BMI 27.69 kg/m2  SpO2 98% Physical Exam Alert and oriented Heart rate regular Normal valve closure click Lungs clear Sternal wound extremely clean and granulating in well No peripheral edema  Diagnostic Tests: Wound cultures remained negative  Impression: Superficial sternal wound infection after redo aVR-Bentall. Wound is granulating in with wound VAC therapy. Continue Monday Wednesday Friday wound VAC changes  Plan:Return December 2 for wound check

## 2014-03-18 NOTE — Progress Notes (Signed)
[name] [name] Date of Birth: 01/09/1956 Medical Record #[id number]  History of Present Illness: [name] [name] is seen for followup of AV disease today. He is  status post aortic valve replacement with a #23 mm St. Jude prosthesis in 1988. 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 He has been on chronic anticoagulation with Coumadin.  In addition to his valvular disease he also has a history of hyperlipidemia and hypertension. Earlier this year he was diagnosed with an ascending thoracic aneurysm. He underwent redo AVR and aortic root replacement with a #23 St. Jude AV and 25 mm Hemashield conduit by Dr. Donata ClayVan Trigt. His post op course was complicated by Afib- he was placed on amiodarone. He subsequently developed a sternal wound infection and was readmitted for debridement. He has a VAC in place. He states he is feeling better. No fever, chills. No chest pain or SOB. Denies palpitations. No edema.  Current Outpatient Prescriptions on File Prior to Visit  Medication Sig Dispense Refill  . albuterol (PROVENTIL HFA;VENTOLIN HFA) 108 (90 BASE) MCG/ACT inhaler Inhale 2 puffs into the lungs every 6 (six) hours as needed for wheezing or shortness of breath.    . ALPRAZolam (XANAX) 0.5 MG tablet Take 0.5 mg by mouth daily as needed for anxiety.     Marland Kitchen. aspirin EC 81 MG EC tablet Take 1 tablet (81 mg total) by mouth daily.    . diphenhydramine-acetaminophen (TYLENOL PM) 25-500 MG TABS Take 1 tablet by mouth at bedtime as needed (sleep).    Marland Kitchen. doxycycline (VIBRA-TABS) 100 MG tablet Take 1 tablet (100 mg total) by mouth every 12 (twelve) hours. (Patient taking differently: Take 100 mg by mouth daily. ) 17 tablet 0  . ezetimibe-simvastatin (VYTORIN) 10-20 MG per tablet Take 1 tablet by mouth at bedtime.     . ferrous fumarate-b12-vitamic C-folic acid (TRINSICON / FOLTRIN) capsule Take 1 capsule by mouth daily. For one month then stop. 30 capsule 0  . loperamide (IMODIUM) 1 MG/5ML  solution Take 5 mLs (1 mg total) by mouth as needed for diarrhea or loose stools. 120 mL 0  . metoprolol tartrate (LOPRESSOR) 25 MG tablet Take 12.5 mg by mouth 2 (two) times daily.    . polyethylene glycol (MIRALAX / GLYCOLAX) packet Take 17 g by mouth 2 (two) times daily. 24 each 0  . warfarin (COUMADIN) 5 MG tablet Take 0.5 tablets (2.5 mg total) by mouth daily. Take 2.5mg  daily or as directed     No current facility-administered medications on file prior to visit.    No Known Allergies  Past Medical History  Diagnosis Date  . Chest pain, atypical   . Hypertension   . Hyperlipidemia   . Chronic anticoagulation   . S/P AVR     for endocarditis  . Ascending aortic aneurysm 01/07/2014  . Anxiety   . Depression   . Asthma     Past Surgical History  Procedure Laterality Date  . Aortic valve replacement  1988    23mm St. Jude valve  . Doppler echocardiography  06/12/1996    EF 55-60%  . Cardiac catheterization    . Hand surgery    . Bentall procedure N/A 01/23/2014    Procedure: BENTALL PROCEDURE;  Surgeon: Kerin PernaPeter Van Trigt, MD;  [facility];  Service: Open Heart Surgery;  Laterality: N/A;  . Aortic valve replacement N/A 01/23/2014    Procedure: REDO AORTIC VALVE REPLACEMENT (AVR) with 23 St. Jude Mechanical Aortic Valved Graft;  Surgeon: Kerin Perna, MD;  Location[facility] OR;  Service: Open Heart Surgery;  Laterality: N/A;  . Intraoperative transesophageal echocardiogram N/A 01/23/2014    Procedure: INTRAOPERATIVE TRANSESOPHAGEAL ECHOCARDIOGRAM;  Surgeon: Kerin Perna, MD;  Location[facility] OR;  Service: Open Heart Surgery;  Laterality: N/A;  . I&d extremity N/A 02/22/2014    Procedure: IRRIGATION AND DEBRIDEMENT EXTREMITY-CHEST;  Surgeon: Kerin Perna, MD;  [facility];  Service: Vascular;  Laterality: N/A;  PULSE LAVAGE  . Application of wound vac N/A 02/22/2014    Procedure: APPLICATION OF WOUND VAC;  Surgeon: Kerin Perna, MD;  Location: [facility] OR;  Service: Vascular;   Laterality: N/A;    History  Smoking status  . Former Smoker  . Quit date: 05/04/1986  Smokeless tobacco  . Never Used    History  Alcohol Use  . 1.8 oz/week  . 3 Shots of liquor per week    Comment: daily    Family History  Problem Relation Age of Onset  . Cancer Mother   . Healthy Brother   . Healthy Brother     Review of Systems: As noted in HPI.  All other systems were reviewed and are negative.  Physical Exam: BP 128/78 mmHg  Pulse 81  Ht 5\' 10"  (1.778 m)  Wt 193 lb 1.6 oz (87.59 kg)  BMI 27.71 kg/m2 He is a pleasant white male in no acute distress. HEENT exam is unremarkable. He is normocephalic, atraumatic. Pupils are equal round and reactive. Sclera are clear. Oropharynx is clear. Neck is supple without JVD, adenopathy, thyromegaly, or bruits. Carotid upstrokes are normal. Lungs are clear. Cardiac exam reveals a regular rate and rhythm with a good mechanical aortic valve click. There are no murmurs or rubs. Chest reveals a VAC in place on the sternum. No redness or swelling. Abdomen is soft and nontender without masses or bruits. Extremities are without edema. Equal pulses are 2+ and symmetric. Skin is warm and dry. She is alert and oriented x3. Cranial nerves II through XII are intact.  LABORATORY DATA:   Lab Results  Component Value Date   WBC 12.1* 03/03/2014   HGB 10.5* 03/03/2014   HCT 33.0* 03/03/2014   PLT 322 03/03/2014   GLUCOSE 109* 03/05/2014   CHOL 127 04/05/2013   TRIG 64.0 04/05/2013   HDL 50.90 04/05/2013   LDLDIRECT 181.4 11/16/2012   LDLCALC 63 04/05/2013   ALT 59* 02/24/2014   AST 38* 02/24/2014   NA 142 03/05/2014   K 4.0 03/05/2014   CL 105 03/05/2014   CREATININE 1.96* 03/05/2014   BUN 26* 03/05/2014   CO2 25 03/05/2014   INR 2.6 03/14/2014   HGBA1C 6.1* 01/20/2014   CLINICAL DATA: 58 year old male with history of aortic valve replacement on 01/23/2014, with nonhealing wound of of the sternum. Chest pain  today.  EXAM: CHEST 2 VIEW  COMPARISON: Chest x-ray 02/23/2014.  FINDINGS: Lung volumes are normal. No consolidative airspace disease. No pleural effusions. No pneumothorax. No pulmonary nodule or mass noted. Pulmonary vasculature and the cardiomediastinal silhouette are within normal limits. Status post median sternotomy for mechanical aortic valve replacement [facility] Jude type valve). Single surgical clip overlying the upper right hemithorax incidentally noted. A faintly radiopaque tubing is seen projecting over the mediastinum, presumably a wound vac.  IMPRESSION: 1. No radiographic evidence of acute cardiopulmonary disease. 2. Postoperative changes and support apparatus, as above.   Electronically Signed  By: Trudie Reed M.D.  On: 03/12/2014 13:24x  Assessment / Plan:  1. S/p redo mechanical AVR with Aortic root grafting. Therapeutic on coumadin.  2. HTN- controlled.  3. Hyperlipidemia controlled on Vytorin.  4. Sternal wound infection. VAC in place. Per Dr. Donata ClayVan Trigt.   5. Post op atrial fibrillation. Resolved. Will DC amiodarone.  I will plan on an Echocardiogram once his sternum has healed to establish a new baseline. I will follow up in 3 months.

## 2014-03-18 NOTE — Patient Instructions (Signed)
Stop taking amiodarone  Continue your other therapy  I will see you in 3 months   

## 2014-03-19 ENCOUNTER — Ambulatory Visit: Payer: BC Managed Care – PPO | Admitting: Cardiothoracic Surgery

## 2014-03-19 DIAGNOSIS — Z954 Presence of other heart-valve replacement: Secondary | ICD-10-CM

## 2014-03-24 ENCOUNTER — Ambulatory Visit (INDEPENDENT_AMBULATORY_CARE_PROVIDER_SITE_OTHER): Payer: BC Managed Care – PPO | Admitting: Cardiovascular Disease

## 2014-03-24 DIAGNOSIS — I359 Nonrheumatic aortic valve disorder, unspecified: Secondary | ICD-10-CM

## 2014-03-24 DIAGNOSIS — I480 Paroxysmal atrial fibrillation: Secondary | ICD-10-CM

## 2014-03-24 DIAGNOSIS — Z952 Presence of prosthetic heart valve: Secondary | ICD-10-CM

## 2014-03-24 DIAGNOSIS — Z954 Presence of other heart-valve replacement: Secondary | ICD-10-CM

## 2014-03-24 LAB — POCT INR: INR: 2.3

## 2014-03-26 ENCOUNTER — Encounter: Payer: Self-pay | Admitting: Cardiothoracic Surgery

## 2014-03-26 ENCOUNTER — Ambulatory Visit (INDEPENDENT_AMBULATORY_CARE_PROVIDER_SITE_OTHER): Payer: Self-pay | Admitting: Cardiothoracic Surgery

## 2014-03-26 VITALS — BP 143/91 | HR 87 | Resp 16 | Ht 70.0 in | Wt 193.0 lb

## 2014-03-26 DIAGNOSIS — IMO0001 Reserved for inherently not codable concepts without codable children: Secondary | ICD-10-CM

## 2014-03-26 DIAGNOSIS — I712 Thoracic aortic aneurysm, without rupture: Secondary | ICD-10-CM

## 2014-03-26 DIAGNOSIS — T814XXD Infection following a procedure, subsequent encounter: Secondary | ICD-10-CM

## 2014-03-26 DIAGNOSIS — I7121 Aneurysm of the ascending aorta, without rupture: Secondary | ICD-10-CM

## 2014-03-26 DIAGNOSIS — Z954 Presence of other heart-valve replacement: Secondary | ICD-10-CM

## 2014-03-26 DIAGNOSIS — Z952 Presence of prosthetic heart valve: Secondary | ICD-10-CM

## 2014-03-26 DIAGNOSIS — I350 Nonrheumatic aortic (valve) stenosis: Secondary | ICD-10-CM

## 2014-03-26 NOTE — Progress Notes (Signed)
PCP is Lupe CarneyMITCHELL,DEAN, MD Referring Provider is SwazilandJordan, Lien Lyman M, MD  Chief Complaint  Patient presents with  . Routine Post Op    sternal wound check/wound vac    HPI: Patient returns for routine wound check. The patient has had a wound VAC for the past 3 weeks for superficial sternal wound infection. He is completed his IV and oral antibiotics The wound is 100% granulation clean and very shallow and does not need wound VAC therapy We will convert to daily wet to dry saline dressings and follow in the office. He was given a refill for his Ambien sleep medication  Past Medical History  Diagnosis Date  . Chest pain, atypical   . Hypertension   . Hyperlipidemia   . Chronic anticoagulation   . S/P AVR     for endocarditis  . Ascending aortic aneurysm 01/07/2014  . Anxiety   . Depression   . Asthma     Past Surgical History  Procedure Laterality Date  . Aortic valve replacement  1988    23mm St. Jude valve  . Doppler echocardiography  06/12/1996    EF 55-60%  . Cardiac catheterization    . Hand surgery    . Bentall procedure N/A 01/23/2014    Procedure: BENTALL PROCEDURE;  Surgeon: Kerin PernaPeter Van Trigt, MD;  [facility];  Service: Open Heart Surgery;  Laterality: N/A;  . Aortic valve replacement N/A 01/23/2014    Procedure: REDO AORTIC VALVE REPLACEMENT (AVR) with 23 St. Jude Mechanical Aortic Valved Graft;  Surgeon: Kerin PernaPeter Van Trigt, MD;  [facility];  Service: Open Heart Surgery;  Laterality: N/A;  . Intraoperative transesophageal echocardiogram N/A 01/23/2014    Procedure: INTRAOPERATIVE TRANSESOPHAGEAL ECHOCARDIOGRAM;  Surgeon: Kerin PernaPeter Van Trigt, MD;  [facility];  Service: Open Heart Surgery;  Laterality: N/A;  . I&d extremity N/A 02/22/2014    Procedure: IRRIGATION AND DEBRIDEMENT EXTREMITY-CHEST;  Surgeon: Kerin PernaPeter Van Trigt, MD;  [facility];  Service: Vascular;  Laterality: N/A;  PULSE LAVAGE  . Application of wound vac N/A 02/22/2014    Procedure: APPLICATION OF  WOUND VAC;  Surgeon: Kerin PernaPeter Van Trigt, MD;  [facility];  Service: Vascular;  Laterality: N/A;    Family History  Problem Relation Age of Onset  . Cancer Mother   . Healthy Brother   . Healthy Brother     Social History History  Substance Use Topics  . Smoking status: Former Smoker    Quit date: 05/04/1986  . Smokeless tobacco: Never Used  . Alcohol Use: 1.8 oz/week    3 Shots of liquor per week     Comment: daily    Current Outpatient Prescriptions  Medication Sig Dispense Refill  . albuterol (PROVENTIL HFA;VENTOLIN HFA) 108 (90 BASE) MCG/ACT inhaler Inhale 2 puffs into the lungs every 6 (six) hours as needed for wheezing or shortness of breath.    . ALPRAZolam (XANAX) 0.5 MG tablet Take 0.5 mg by mouth daily as needed for anxiety.     Marland Kitchen. aspirin EC 81 MG EC tablet Take 1 tablet (81 mg total) by mouth daily.    . diphenhydramine-acetaminophen (TYLENOL PM) 25-500 MG TABS Take 1 tablet by mouth at bedtime as needed (sleep).    Marland Kitchen. doxycycline (VIBRA-TABS) 100 MG tablet Take 1 tablet (100 mg total) by mouth every 12 (twelve) hours. (Patient taking differently: Take 100 mg by mouth daily. ) 17 tablet 0  . ferrous fumarate-b12-vitamic C-folic acid (TRINSICON / FOLTRIN) capsule Take 1 capsule by mouth  daily. For one month then stop. 30 capsule 0  . loperamide (IMODIUM) 1 MG/5ML solution Take 5 mLs (1 mg total) by mouth as needed for diarrhea or loose stools. 120 mL 0  . metoprolol tartrate (LOPRESSOR) 25 MG tablet Take 12.5 mg by mouth 2 (two) times daily.    . polyethylene glycol (MIRALAX / GLYCOLAX) packet Take 17 g by mouth 2 (two) times daily. 24 each 0  . warfarin (COUMADIN) 5 MG tablet Take 0.5 tablets (2.5 mg total) by mouth daily. Take 2.5mg  daily or as directed    . zolpidem (AMBIEN) 10 MG tablet Take 10 mg by mouth at bedtime as needed for sleep.    Marland Kitchen. ezetimibe-simvastatin (VYTORIN) 10-20 MG per tablet Take 1 tablet by mouth at bedtime.      No current facility-administered  medications for this visit.    No Known Allergies  Review of Systemsimproved energy walking daily--he wished return to work and was given a RTW release for 4 hour days until after the first year  BP 143/91 mmHg  Pulse 87  Resp 16  Ht 5\' 10"  (1.778 m)  Wt 193 lb (87.544 kg)  BMI 27.69 kg/m2  SpO2 98% Physical Exam Alert and pleasant Lungs clear Heart rate regular with sharp valve closure click Sternal incision completely clean very superficial does not need wound VAC  Diagnostic Tests: none  Impression: Wound granulating in extremely well  Plan: Return in one week for wound check. Follow  blood pressure elevation -- he may need to increase Lopressor 25 mg twice a day

## 2014-03-31 ENCOUNTER — Encounter: Payer: Self-pay | Admitting: Physician Assistant

## 2014-03-31 ENCOUNTER — Other Ambulatory Visit: Payer: Self-pay | Admitting: Cardiothoracic Surgery

## 2014-03-31 ENCOUNTER — Ambulatory Visit (INDEPENDENT_AMBULATORY_CARE_PROVIDER_SITE_OTHER): Payer: Self-pay | Admitting: Physician Assistant

## 2014-03-31 VITALS — BP 125/78 | HR 77 | Resp 16 | Ht 70.0 in | Wt 193.0 lb

## 2014-03-31 DIAGNOSIS — Z5189 Encounter for other specified aftercare: Secondary | ICD-10-CM

## 2014-03-31 DIAGNOSIS — I712 Thoracic aortic aneurysm, without rupture: Secondary | ICD-10-CM

## 2014-03-31 DIAGNOSIS — Z952 Presence of prosthetic heart valve: Secondary | ICD-10-CM

## 2014-03-31 DIAGNOSIS — Z954 Presence of other heart-valve replacement: Secondary | ICD-10-CM

## 2014-03-31 DIAGNOSIS — I7121 Aneurysm of the ascending aorta, without rupture: Secondary | ICD-10-CM

## 2014-03-31 NOTE — Progress Notes (Signed)
  HPI:  Patient returns for routine postoperative follow-up having undergone an irrigation and debridement and placement of a wound vac on 02/22/2014. He initially had a redo sternotomy for Bentall and placement of a femoral arterial line by Dr. Donata ClayVan Trigt on 01/25/2004. He was last seen in  the office by Dr. Donata ClayVan Trigt on 03/26/2014 for a chest wound check. Wound vac was removed and damp to dry dressing was applied.    Current Outpatient Prescriptions  Medication Sig Dispense Refill  . albuterol (PROVENTIL HFA;VENTOLIN HFA) 108 (90 BASE) MCG/ACT inhaler Inhale 2 puffs into the lungs every 6 (six) hours as needed for wheezing or shortness of breath.    . ALPRAZolam (XANAX) 0.5 MG tablet Take 0.5 mg by mouth daily as needed for anxiety.     Marland Kitchen. aspirin EC 81 MG EC tablet Take 1 tablet (81 mg total) by mouth daily.    . diphenhydramine-acetaminophen (TYLENOL PM) 25-500 MG TABS Take 1 tablet by mouth at bedtime as needed (sleep).    Marland Kitchen. doxycycline (VIBRA-TABS) 100 MG tablet Take 1 tablet (100 mg total) by mouth every 12 (twelve) hours. (Patient taking differently: Take 100 mg by mouth daily. ) 17 tablet 0  . ezetimibe-simvastatin (VYTORIN) 10-20 MG per tablet Take 1 tablet by mouth at bedtime.     . ferrous fumarate-b12-vitamic C-folic acid (TRINSICON / FOLTRIN) capsule Take 1 capsule by mouth daily. For one month then stop. 30 capsule 0  . loperamide (IMODIUM) 1 MG/5ML solution Take 5 mLs (1 mg total) by mouth as needed for diarrhea or loose stools. 120 mL 0  . metoprolol tartrate (LOPRESSOR) 25 MG tablet Take 12.5 mg by mouth 2 (two) times daily.    . polyethylene glycol (MIRALAX / GLYCOLAX) packet Take 17 g by mouth 2 (two) times daily. 24 each 0  . warfarin (COUMADIN) 5 MG tablet Take 0.5 tablets (2.5 mg total) by mouth daily. Take 2.5mg  daily or as directed    . zolpidem (AMBIEN) 10 MG tablet Take 10 mg by mouth at bedtime as needed for sleep.     No current facility-administered medications for  this visit.    Physical Exam: CV: RRR Pulmonary: Clear to auscultation; no rales, rhonchi, wheezing Extremities: No cyanosis, clubbing, edema Wound: Granulation present. No erythema or drainage.  Impression and Plan: He has a mechanical aortic valve. Last INR was drawn on  03/24/2014 and it was 2.3. He is scheduled to have another drawn this week or next. His chest wound is healing very well. He is to continue damp to dry dressings daily. He will return to see Dr. Donata ClayVan Trigt next Wednesday 04/09/2014 for a wound check.

## 2014-04-03 ENCOUNTER — Encounter (HOSPITAL_COMMUNITY): Payer: Self-pay | Admitting: Cardiovascular Disease

## 2014-04-08 ENCOUNTER — Other Ambulatory Visit: Payer: Self-pay | Admitting: Cardiothoracic Surgery

## 2014-04-08 DIAGNOSIS — Z952 Presence of prosthetic heart valve: Secondary | ICD-10-CM

## 2014-04-09 ENCOUNTER — Ambulatory Visit (INDEPENDENT_AMBULATORY_CARE_PROVIDER_SITE_OTHER): Payer: Self-pay | Admitting: Cardiothoracic Surgery

## 2014-04-09 ENCOUNTER — Encounter: Payer: Self-pay | Admitting: Cardiothoracic Surgery

## 2014-04-09 ENCOUNTER — Ambulatory Visit: Payer: Self-pay | Admitting: Cardiothoracic Surgery

## 2014-04-09 VITALS — BP 134/70 | HR 18 | Resp 18 | Ht 70.0 in | Wt 193.0 lb

## 2014-04-09 DIAGNOSIS — I7121 Aneurysm of the ascending aorta, without rupture: Secondary | ICD-10-CM

## 2014-04-09 DIAGNOSIS — Z954 Presence of other heart-valve replacement: Secondary | ICD-10-CM

## 2014-04-09 DIAGNOSIS — Z5189 Encounter for other specified aftercare: Secondary | ICD-10-CM

## 2014-04-09 DIAGNOSIS — I712 Thoracic aortic aneurysm, without rupture: Secondary | ICD-10-CM

## 2014-04-09 DIAGNOSIS — Z952 Presence of prosthetic heart valve: Secondary | ICD-10-CM

## 2014-04-09 NOTE — Progress Notes (Signed)
PCP is Lupe CarneyMITCHELL,DEAN, MD Referring Provider is SwazilandJordan, Peter M, MD  Chief Complaint  Patient presents with  . Routine Post Op    follow up sternal wound receiving daily wet to dry n/s dressings    BMW:[id number]:patient returns for wound check-yes superficial sternal infection after redo aVR Bentall and replacement ascending aorta. The wound has completely granulated. He now has granulation tissue which is exuberant-proud flesh. This was cauterized with silver nitrate sticks and dry dressing was applied. Continue dry dressing and normal skin care which daily showers. The patient is doing well without cardiac symptoms and is back at work.  Past Medical History  Diagnosis Date  . Chest pain, atypical   . Hypertension   . Hyperlipidemia   . Chronic anticoagulation   . S/P AVR     for endocarditis  . Ascending aortic aneurysm 01/07/2014  . Anxiety   . Depression   . Asthma     Past Surgical History  Procedure Laterality Date  . Aortic valve replacement  1988    23mm St. Jude valve  . Doppler echocardiography  06/12/1996    EF 55-60%  . Cardiac catheterization    . Hand surgery    . Bentall procedure N/A 01/23/2014    Procedure: BENTALL PROCEDURE;  Surgeon: Kerin PernaPeter Van Trigt, MD;  [facility];  Service: Open Heart Surgery;  Laterality: N/A;  . Aortic valve replacement N/A 01/23/2014    Procedure: REDO AORTIC VALVE REPLACEMENT (AVR) with 23 St. Jude Mechanical Aortic Valved Graft;  Surgeon: Kerin PernaPeter Van Trigt, MD;  [facility];  Service: Open Heart Surgery;  Laterality: N/A;  . Intraoperative transesophageal echocardiogram N/A 01/23/2014    Procedure: INTRAOPERATIVE TRANSESOPHAGEAL ECHOCARDIOGRAM;  Surgeon: Kerin PernaPeter Van Trigt, MD;  [facility];  Service: Open Heart Surgery;  Laterality: N/A;  . I&d extremity N/A 02/22/2014    Procedure: IRRIGATION AND DEBRIDEMENT EXTREMITY-CHEST;  Surgeon: Kerin PernaPeter Van Trigt, MD;  [facility];  Service: Vascular;  Laterality: N/A;  PULSE LAVAGE  . Application  of wound vac N/A 02/22/2014    Procedure: APPLICATION OF WOUND VAC;  Surgeon: Kerin PernaPeter Van Trigt, MD;  [facility];  Service: Vascular;  Laterality: N/A;  . Left heart catheterization with coronary angiogram N/A 01/07/2014    Procedure: LEFT HEART CATHETERIZATION WITH CORONARY ANGIOGRAM;  Surgeon: Lennette Biharihomas A Kelly, MD;  [facility];  Service: Cardiovascular;  Laterality: N/A;    Family History  Problem Relation Age of Onset  . Cancer Mother   . Healthy Brother   . Healthy Brother     Social History History  Substance Use Topics  . Smoking status: Former Smoker    Quit date: 05/04/1986  . Smokeless tobacco: Never Used  . Alcohol Use: 1.8 oz/week    3 Shots of liquor per week     Comment: daily    Current Outpatient Prescriptions  Medication Sig Dispense Refill  . albuterol (PROVENTIL HFA;VENTOLIN HFA) 108 (90 BASE) MCG/ACT inhaler Inhale 2 puffs into the lungs every 6 (six) hours as needed for wheezing or shortness of breath.    . ALPRAZolam (XANAX) 0.5 MG tablet Take 0.5 mg by mouth daily as needed for anxiety.     Marland Kitchen. aspirin EC 81 MG EC tablet Take 1 tablet (81 mg total) by mouth daily.    . diphenhydramine-acetaminophen (TYLENOL PM) 25-500 MG TABS Take 1 tablet by mouth at bedtime as needed (sleep).    . ezetimibe-simvastatin (VYTORIN) 10-20 MG per tablet Take 1 tablet  by mouth at bedtime.     Marland Kitchen. loperamide (IMODIUM) 1 MG/5ML solution Take 5 mLs (1 mg total) by mouth as needed for diarrhea or loose stools. 120 mL 0  . metoprolol tartrate (LOPRESSOR) 25 MG tablet Take 12.5 mg by mouth 2 (two) times daily.    . polyethylene glycol (MIRALAX / GLYCOLAX) packet Take 17 g by mouth 2 (two) times daily. 24 each 0  . warfarin (COUMADIN) 5 MG tablet Take 0.5 tablets (2.5 mg total) by mouth daily. Take 2.5mg  daily or as directed    . zolpidem (AMBIEN) 10 MG tablet Take 10 mg by mouth at bedtime as needed for sleep.     No current facility-administered medications for this visit.     No Known Allergies  Review of Systemsno fever, no CHF symptoms  BP 134/70 mmHg  Pulse 18  Resp 18  Ht 5\' 10"  (1.778 m)  Wt 193 lb (87.544 kg)  BMI 27.69 kg/m2  SpO2 98% Physical Exam Alert and comfortable Heart rate regular Lungs clear Sternal incision with exuberant granulation tissue chemically cauterized with silver nitrate  Diagnostic Test s:none Wound check  Impression: Doing well. Continue current normal skin care and cover granulation tissue a dry dressing.  Plan:return in 2 weeks for  wound check

## 2014-04-11 ENCOUNTER — Ambulatory Visit (INDEPENDENT_AMBULATORY_CARE_PROVIDER_SITE_OTHER): Payer: BC Managed Care – PPO | Admitting: Pharmacist

## 2014-04-11 DIAGNOSIS — I480 Paroxysmal atrial fibrillation: Secondary | ICD-10-CM

## 2014-04-11 DIAGNOSIS — Z952 Presence of prosthetic heart valve: Secondary | ICD-10-CM

## 2014-04-11 DIAGNOSIS — I359 Nonrheumatic aortic valve disorder, unspecified: Secondary | ICD-10-CM

## 2014-04-11 DIAGNOSIS — Z954 Presence of other heart-valve replacement: Secondary | ICD-10-CM

## 2014-04-11 LAB — POCT INR: INR: 2.2

## 2014-04-15 NOTE — Telephone Encounter (Signed)
Opened in error

## 2014-04-30 ENCOUNTER — Ambulatory Visit: Payer: BC Managed Care – PPO | Admitting: Cardiothoracic Surgery

## 2014-05-05 ENCOUNTER — Ambulatory Visit (INDEPENDENT_AMBULATORY_CARE_PROVIDER_SITE_OTHER): Payer: BLUE CROSS/BLUE SHIELD | Admitting: Surgical

## 2014-05-05 VITALS — BP 123/77 | HR 94 | Resp 16 | Ht 70.0 in | Wt 195.0 lb

## 2014-05-05 DIAGNOSIS — T814XXD Infection following a procedure, subsequent encounter: Secondary | ICD-10-CM

## 2014-05-05 DIAGNOSIS — I712 Thoracic aortic aneurysm, without rupture: Secondary | ICD-10-CM

## 2014-05-05 DIAGNOSIS — I7121 Aneurysm of the ascending aorta, without rupture: Secondary | ICD-10-CM

## 2014-05-05 DIAGNOSIS — Z952 Presence of prosthetic heart valve: Secondary | ICD-10-CM

## 2014-05-05 DIAGNOSIS — Z954 Presence of other heart-valve replacement: Secondary | ICD-10-CM

## 2014-05-05 DIAGNOSIS — IMO0001 Reserved for inherently not codable concepts without codable children: Secondary | ICD-10-CM

## 2014-05-05 NOTE — Progress Notes (Signed)
[address]       [place] [address]             [id number]                  MASSIE MEES [facility] Medical Record #[id number] Date of Birth: November 19, 1955  Referring OZ:HYQMVH, Demetria Pore, MD Primary Cardiology: Primary Care:MITCHELL,DEAN, MD  Chief Complaint:  Follow Up Visit   PRE-OPERATIVE DIAGNOSIS: Superficial sternal WOUND INFECTION  POST-OPERATIVE DIAGNOSIS: Superficial sternal WOUND INFECTION  PROCEDURE: Procedure(s) with comments: IRRIGATION AND DEBRIDEMENT EXTREMITY-CHEST (N/A) - PULSE LAVAGE APPLICATION OF WOUND VAC (N/A)  SURGEON: Surgeon(s) and Role:  * Kerin Perna, MD - Primary History of Present Illness:      The patient is seen in routine office visit following the above procedure. Currently he is just using soap and water and showering. 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       Zubrod Score: At the time of surgery this patient's most appropriate activity status/level should be described as:     0    Normal activity, no symptoms     1    Restricted in physical strenuous activity but ambulatory, able to do out light work     2    Ambulatory and capable of self care, unable to do work activities, up and about                 >50 % of waking hours                                                                                       3    Only limited self care, in bed greater than 50% of waking hours     4    Completely disabled, no self care, confined to bed or chair     5    Moribund  History  Smoking status  . Former Smoker  . Quit date: 05/04/1986  Smokeless tobacco  . Never Used       No Known Allergies  Current Outpatient Prescriptions  Medication Sig Dispense Refill  . albuterol (PROVENTIL HFA;VENTOLIN HFA) 108 (90 BASE) MCG/ACT inhaler Inhale 2 puffs into the lungs every 6 (six) hours as needed for wheezing or shortness of breath.      . ALPRAZolam (XANAX) 0.5 MG tablet Take 0.5 mg by mouth daily as needed for anxiety.     Marland Kitchen aspirin EC 81 MG EC tablet Take 1 tablet (81 mg total) by mouth daily.    . diphenhydramine-acetaminophen (TYLENOL PM) 25-500 MG TABS Take 1 tablet by mouth at bedtime as needed (sleep).    . ezetimibe-simvastatin (VYTORIN) 10-20 MG per tablet Take 1 tablet by mouth at bedtime.     Marland Kitchen loperamide (IMODIUM) 1 MG/5ML solution Take 5 mLs (1 mg total) by mouth as needed for diarrhea or loose stools. 120 mL 0  . metoprolol tartrate (LOPRESSOR) 25 MG tablet Take 12.5 mg by mouth 2 (two) times daily.    . polyethylene glycol (MIRALAX /  GLYCOLAX) packet Take 17 g by mouth 2 (two) times daily. 24 each 0  . warfarin (COUMADIN) 5 MG tablet Take 0.5 tablets (2.5 mg total) by mouth daily. Take 2.5mg  daily or as directed    . zolpidem (AMBIEN) 10 MG tablet Take 10 mg by mouth at bedtime as needed for sleep.     No current facility-administered medications for this visit.       Physical Exam: BP 123/77 mmHg  Pulse 94  Resp 16  Ht 5\' 10"  (1.778 m)  Wt 195 lb (88.451 kg)  BMI 27.98 kg/m2  SpO2 97%  Wound: The wound is healed. Wounds:  Diagnostic Studies & Laboratory data:         Recent Radiology Findings: No results found.    Recent Labs: Lab Results  Component Value Date   WBC 12.1* 03/03/2014   HGB 10.5* 03/03/2014   HCT 33.0* 03/03/2014   PLT 322 03/03/2014   GLUCOSE 109* 03/05/2014   CHOL 127 04/05/2013   TRIG 64.0 04/05/2013   HDL 50.90 04/05/2013   LDLDIRECT 181.4 11/16/2012   LDLCALC 63 04/05/2013   ALT 59* 02/24/2014   AST 38* 02/24/2014   NA 142 03/05/2014   K 4.0 03/05/2014   CL 105 03/05/2014   CREATININE 1.96* 03/05/2014   BUN 26* 03/05/2014   CO2 25 03/05/2014   INR 2.2 04/11/2014   HGBA1C 6.1* 01/20/2014      Assessment / Plan:      The wound is healed. We will see again when necessary      Analucia Hush E 05/05/2014 1:53 PM

## 2014-05-09 ENCOUNTER — Ambulatory Visit (INDEPENDENT_AMBULATORY_CARE_PROVIDER_SITE_OTHER): Payer: BLUE CROSS/BLUE SHIELD | Admitting: Pharmacist

## 2014-05-09 DIAGNOSIS — I480 Paroxysmal atrial fibrillation: Secondary | ICD-10-CM

## 2014-05-09 DIAGNOSIS — Z954 Presence of other heart-valve replacement: Secondary | ICD-10-CM

## 2014-05-09 DIAGNOSIS — I359 Nonrheumatic aortic valve disorder, unspecified: Secondary | ICD-10-CM

## 2014-05-09 DIAGNOSIS — Z952 Presence of prosthetic heart valve: Secondary | ICD-10-CM

## 2014-05-09 LAB — POCT INR: INR: 1.7

## 2014-05-23 ENCOUNTER — Other Ambulatory Visit: Payer: Self-pay | Admitting: Physician Assistant

## 2014-05-24 ENCOUNTER — Other Ambulatory Visit: Payer: Self-pay | Admitting: Physician Assistant

## 2014-05-27 ENCOUNTER — Telehealth: Payer: Self-pay | Admitting: Cardiology

## 2014-05-27 MED ORDER — WARFARIN SODIUM 5 MG PO TABS: 2.5000 mg | ORAL_TABLET | Freq: Every day | ORAL | Status: DC

## 2014-05-27 NOTE — Telephone Encounter (Signed)
Refill submitted to patient's preferred pharmacy. Informed patient. Pt voiced understanding, no other stated concerns at this time.  

## 2014-05-27 NOTE — Telephone Encounter (Signed)
°  1. Which medications need to be refilled? [place]  2. Which pharmacy is medication to be sent to?[facility] on [address] *  3. Do they need a 30 day or 90 day supply?90  4. Would they like a call back once the medication has been sent to the pharmacy? yes

## 2014-05-30 ENCOUNTER — Ambulatory Visit (INDEPENDENT_AMBULATORY_CARE_PROVIDER_SITE_OTHER): Payer: BLUE CROSS/BLUE SHIELD | Admitting: *Deleted

## 2014-05-30 ENCOUNTER — Telehealth: Payer: Self-pay | Admitting: *Deleted

## 2014-05-30 DIAGNOSIS — Z952 Presence of prosthetic heart valve: Secondary | ICD-10-CM

## 2014-05-30 DIAGNOSIS — I359 Nonrheumatic aortic valve disorder, unspecified: Secondary | ICD-10-CM

## 2014-05-30 DIAGNOSIS — Z954 Presence of other heart-valve replacement: Secondary | ICD-10-CM

## 2014-05-30 DIAGNOSIS — I480 Paroxysmal atrial fibrillation: Secondary | ICD-10-CM

## 2014-05-30 LAB — PROTIME-INR
INR: 7 ratio (ref 0.8–1.0)
Prothrombin Time: 73.5 s (ref 9.6–13.1)

## 2014-05-30 LAB — POCT INR: INR: 7

## 2014-05-30 NOTE — Telephone Encounter (Signed)
Called patient multiple times and left a message to continue holding coumadin until we contact him on Monday.

## 2014-06-02 ENCOUNTER — Telehealth: Payer: Self-pay | Admitting: *Deleted

## 2014-06-02 NOTE — Telephone Encounter (Signed)
Called patient to inform him of his INR lab results from Friday, February 5th and set an appointment. Patient states he would like to return to the office in a week but I explained to him we need to recheck his INR sooner than a week so we can adjust his warfarin dose. Explained the risks of bleeding to the patient if his INR level remains elevated and to go to the ER if he falls, has abnormal bleeding, or hits his head.  Also, instructed him that since we have him holding his warfarin his level will decrease and we need to monitor it and every day that it is held it drops a point. He states he understands and made an appt to return on 06/03/14. Advised him not to take his wafarin until we see him. He verbalized understanding.

## 2014-06-03 ENCOUNTER — Other Ambulatory Visit: Payer: Self-pay | Admitting: *Deleted

## 2014-06-03 ENCOUNTER — Ambulatory Visit (INDEPENDENT_AMBULATORY_CARE_PROVIDER_SITE_OTHER): Payer: BLUE CROSS/BLUE SHIELD | Admitting: *Deleted

## 2014-06-03 DIAGNOSIS — Z954 Presence of other heart-valve replacement: Secondary | ICD-10-CM

## 2014-06-03 DIAGNOSIS — I480 Paroxysmal atrial fibrillation: Secondary | ICD-10-CM

## 2014-06-03 DIAGNOSIS — Z952 Presence of prosthetic heart valve: Secondary | ICD-10-CM

## 2014-06-03 DIAGNOSIS — I359 Nonrheumatic aortic valve disorder, unspecified: Secondary | ICD-10-CM

## 2014-06-03 LAB — POCT INR: INR: 1.3

## 2014-06-03 MED ORDER — WARFARIN SODIUM 5 MG PO TABS: 2.5000 mg | ORAL_TABLET | Freq: Every day | ORAL | Status: DC

## 2014-06-13 ENCOUNTER — Ambulatory Visit (INDEPENDENT_AMBULATORY_CARE_PROVIDER_SITE_OTHER): Payer: BLUE CROSS/BLUE SHIELD | Admitting: Pharmacist

## 2014-06-13 DIAGNOSIS — I480 Paroxysmal atrial fibrillation: Secondary | ICD-10-CM

## 2014-06-13 DIAGNOSIS — Z954 Presence of other heart-valve replacement: Secondary | ICD-10-CM

## 2014-06-13 DIAGNOSIS — Z952 Presence of prosthetic heart valve: Secondary | ICD-10-CM

## 2014-06-13 DIAGNOSIS — I359 Nonrheumatic aortic valve disorder, unspecified: Secondary | ICD-10-CM

## 2014-06-13 LAB — POCT INR: INR: 1.9

## 2014-06-26 ENCOUNTER — Ambulatory Visit (INDEPENDENT_AMBULATORY_CARE_PROVIDER_SITE_OTHER): Payer: BLUE CROSS/BLUE SHIELD | Admitting: Cardiology

## 2014-06-26 ENCOUNTER — Ambulatory Visit (INDEPENDENT_AMBULATORY_CARE_PROVIDER_SITE_OTHER): Payer: BLUE CROSS/BLUE SHIELD | Admitting: *Deleted

## 2014-06-26 ENCOUNTER — Encounter: Payer: Self-pay | Admitting: Cardiology

## 2014-06-26 VITALS — BP 148/74 | HR 88 | Ht 70.0 in | Wt 186.0 lb

## 2014-06-26 DIAGNOSIS — Z954 Presence of other heart-valve replacement: Secondary | ICD-10-CM

## 2014-06-26 DIAGNOSIS — I480 Paroxysmal atrial fibrillation: Secondary | ICD-10-CM

## 2014-06-26 DIAGNOSIS — I359 Nonrheumatic aortic valve disorder, unspecified: Secondary | ICD-10-CM

## 2014-06-26 DIAGNOSIS — Z952 Presence of prosthetic heart valve: Secondary | ICD-10-CM

## 2014-06-26 DIAGNOSIS — I7121 Aneurysm of the ascending aorta, without rupture: Secondary | ICD-10-CM

## 2014-06-26 DIAGNOSIS — I712 Thoracic aortic aneurysm, without rupture: Secondary | ICD-10-CM

## 2014-06-26 DIAGNOSIS — I10 Essential (primary) hypertension: Secondary | ICD-10-CM

## 2014-06-26 LAB — POCT INR: INR: 3.7

## 2014-06-26 MED ORDER — EZETIMIBE-SIMVASTATIN 10-20 MG PO TABS: 1.0000 | ORAL_TABLET | Freq: Every day | ORAL | Status: DC

## 2014-06-26 NOTE — Addendum Note (Signed)
Addended by: Meda KlinefelterPUGH, Tyliek Timberman JOHNSON D on: 06/26/2014 11:16 AM   Modules accepted: Orders

## 2014-06-26 NOTE — Progress Notes (Signed)
[name] [name] Date of Birth: Nov 26, 1955 Medical Record #[id number]  History of Present Illness: [name] [name] is seen for followup of AV disease today. He is  status post aortic valve replacement with a #23 mm St. Jude prosthesis in 1988. 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 He has been on chronic anticoagulation with Coumadin.  In addition to his valvular disease he also has a history of hyperlipidemia and hypertension. In 2015 he was diagnosed with an ascending thoracic aneurysm. He underwent redo AVR and aortic root replacement with a #23 St. Jude AV and 25 mm Hemashield conduit by Dr. Donata Clay. His post op course was complicated by Afib and sternal wound infection. He has no recurrence of Afib. His sternum has now healed.  He states he is feeling well.  No chest pain or SOB. Denies palpitations. No edema.he was smoking maijuana the last couple of years but quit due to bronchial irritation.   Current Outpatient Prescriptions on File Prior to Visit  Medication Sig Dispense Refill  . albuterol (PROVENTIL HFA;VENTOLIN HFA) 108 (90 BASE) MCG/ACT inhaler Inhale 2 puffs into the lungs every 6 (six) hours as needed for wheezing or shortness of breath.    . ALPRAZolam (XANAX) 0.5 MG tablet Take 0.5 mg by mouth daily as needed for anxiety.     . diphenhydramine-acetaminophen (TYLENOL PM) 25-500 MG TABS Take 1 tablet by mouth at bedtime as needed (sleep).    . metoprolol tartrate (LOPRESSOR) 25 MG tablet TAKE ONE-HALF TABLET BY MOUTH TWICE DAILY 30 tablet 0  . polyethylene glycol (MIRALAX / GLYCOLAX) packet Take 17 g by mouth 2 (two) times daily. 24 each 0  . warfarin (COUMADIN) 5 MG tablet Take 0.5 tablets (2.5 mg total) by mouth daily. Take 2.5mg  daily or as directed 90 tablet 0   No current facility-administered medications on file prior to visit.    No Known Allergies  Past Medical History  Diagnosis Date  . Chest pain, atypical   . Hypertension   .  Hyperlipidemia   . Chronic anticoagulation   . S/P AVR     for endocarditis  . Ascending aortic aneurysm 01/07/2014  . Anxiety   . Depression   . Asthma     Past Surgical History  Procedure Laterality Date  . Aortic valve replacement  1988    23mm St. Jude valve  . Doppler echocardiography  06/12/1996    EF 55-60%  . Cardiac catheterization    . Hand surgery    . Bentall procedure N/A 01/23/2014    Procedure: BENTALL PROCEDURE;  Surgeon: Kerin Perna, MD;  Location: [facility] OR;  Service: Open Heart Surgery;  Laterality: N/A;  . Aortic valve replacement N/A 01/23/2014    Procedure: REDO AORTIC VALVE REPLACEMENT (AVR) with 23 St. Jude Mechanical Aortic Valved Graft;  Surgeon: Kerin Perna, MD;  Location: [facility] OR;  Service: Open Heart Surgery;  Laterality: N/A;  . Intraoperative transesophageal echocardiogram N/A 01/23/2014    Procedure: INTRAOPERATIVE TRANSESOPHAGEAL ECHOCARDIOGRAM;  Surgeon: Kerin Perna, MD;  Location: [facility] OR;  Service: Open Heart Surgery;  Laterality: N/A;  . I&d extremity N/A 02/22/2014    Procedure: IRRIGATION AND DEBRIDEMENT EXTREMITY-CHEST;  Surgeon: Kerin Perna, MD;  Location: [facility] OR;  Service: Vascular;  Laterality: N/A;  PULSE LAVAGE  . Application of wound vac N/A 02/22/2014    Procedure: APPLICATION OF WOUND VAC;  Surgeon: Kerin Perna, MD;  Location: [facility] OR;  Service: Vascular;  Laterality:  N/A;  . Left heart catheterization with coronary angiogram N/A 01/07/2014    Procedure: LEFT HEART CATHETERIZATION WITH CORONARY ANGIOGRAM;  Surgeon: Lennette Biharihomas A Kelly, MD;  [facility];  Service: Cardiovascular;  Laterality: N/A;    History  Smoking status  . Former Smoker  . Quit date: 05/04/1986  Smokeless tobacco  . Never Used    History  Alcohol Use  . 1.8 oz/week  . 3 Shots of liquor per week    Comment: daily    Family History  Problem Relation Age of Onset  . Cancer Mother   . Healthy Brother   . Healthy Brother     Review of  Systems: As noted in HPI.  All other systems were reviewed and are negative.  Physical Exam: BP 148/74 mmHg  Pulse 88  Ht 5\' 10"  (1.778 m)  Wt 186 lb (84.369 kg)  BMI 26.69 kg/m2 He is a pleasant white male in no acute distress. HEENT exam is unremarkable.  Neck is supple without JVD, adenopathy, thyromegaly, or bruits. Carotid upstrokes are normal. Lungs are clear. Cardiac exam reveals a regular rate and rhythm with a good mechanical aortic valve click. There are no murmurs or rubs. Chest reveals his sternal incision has healed.  Abdomen is soft and nontender without masses or bruits. Extremities are without edema. Equal pulses are 2+ and symmetric. Skin is warm and dry. She is alert and oriented x3. Cranial nerves II through XII are intact.  LABORATORY DATA:   Lab Results  Component Value Date   WBC 12.1* 03/03/2014   HGB 10.5* 03/03/2014   HCT 33.0* 03/03/2014   PLT 322 03/03/2014   GLUCOSE 109* 03/05/2014   CHOL 127 04/05/2013   TRIG 64.0 04/05/2013   HDL 50.90 04/05/2013   LDLDIRECT 181.4 11/16/2012   LDLCALC 63 04/05/2013   ALT 59* 02/24/2014   AST 38* 02/24/2014   NA 142 03/05/2014   K 4.0 03/05/2014   CL 105 03/05/2014   CREATININE 1.96* 03/05/2014   BUN 26* 03/05/2014   CO2 25 03/05/2014   INR 1.9 06/13/2014   HGBA1C 6.1* 01/20/2014   Assessment / Plan: 1. S/p redo mechanical AVR with Aortic root grafting. He has made a good recovery. Will check INR today on coumadin. Needs SBE prophylaxis.   2. HTN- controlled.  3. Hyperlipidemia controlled on Vytorin.  4. Sternal wound infection. Now healed.   5. Post op atrial fibrillation. Resolved.   I will plan on an Echocardiogram now that his sternum has healed for a new baseline. I will follow up in 6 months.

## 2014-06-26 NOTE — Patient Instructions (Signed)
Continue your current therapy  I will see you in 6 months.   

## 2014-06-28 ENCOUNTER — Other Ambulatory Visit: Payer: Self-pay | Admitting: Physician Assistant

## 2014-07-04 ENCOUNTER — Telehealth (HOSPITAL_COMMUNITY): Payer: Self-pay | Admitting: *Deleted

## 2014-07-17 ENCOUNTER — Telehealth (HOSPITAL_COMMUNITY): Payer: Self-pay | Admitting: *Deleted

## 2014-07-18 ENCOUNTER — Ambulatory Visit (INDEPENDENT_AMBULATORY_CARE_PROVIDER_SITE_OTHER): Payer: BLUE CROSS/BLUE SHIELD | Admitting: Pharmacist

## 2014-07-18 DIAGNOSIS — I359 Nonrheumatic aortic valve disorder, unspecified: Secondary | ICD-10-CM | POA: Diagnosis not present

## 2014-07-18 DIAGNOSIS — Z954 Presence of other heart-valve replacement: Secondary | ICD-10-CM

## 2014-07-18 DIAGNOSIS — Z952 Presence of prosthetic heart valve: Secondary | ICD-10-CM

## 2014-07-18 DIAGNOSIS — I480 Paroxysmal atrial fibrillation: Secondary | ICD-10-CM

## 2014-07-18 LAB — POCT INR: INR: 1.5

## 2014-07-21 ENCOUNTER — Other Ambulatory Visit: Payer: Self-pay | Admitting: Physician Assistant

## 2014-07-21 ENCOUNTER — Ambulatory Visit (HOSPITAL_COMMUNITY): Payer: BLUE CROSS/BLUE SHIELD

## 2014-07-23 ENCOUNTER — Other Ambulatory Visit: Payer: Self-pay | Admitting: Physician Assistant

## 2014-07-24 ENCOUNTER — Other Ambulatory Visit: Payer: Self-pay

## 2014-07-24 MED ORDER — METOPROLOL TARTRATE 25 MG PO TABS: 12.5000 mg | ORAL_TABLET | Freq: Two times a day (BID) | ORAL | Status: DC

## 2014-08-08 ENCOUNTER — Ambulatory Visit (INDEPENDENT_AMBULATORY_CARE_PROVIDER_SITE_OTHER): Payer: BLUE CROSS/BLUE SHIELD | Admitting: *Deleted

## 2014-08-08 DIAGNOSIS — I480 Paroxysmal atrial fibrillation: Secondary | ICD-10-CM

## 2014-08-08 DIAGNOSIS — I359 Nonrheumatic aortic valve disorder, unspecified: Secondary | ICD-10-CM | POA: Diagnosis not present

## 2014-08-08 DIAGNOSIS — Z954 Presence of other heart-valve replacement: Secondary | ICD-10-CM

## 2014-08-08 DIAGNOSIS — Z952 Presence of prosthetic heart valve: Secondary | ICD-10-CM

## 2014-08-08 LAB — POCT INR: INR: 2.7

## 2014-09-05 ENCOUNTER — Ambulatory Visit (INDEPENDENT_AMBULATORY_CARE_PROVIDER_SITE_OTHER): Payer: BLUE CROSS/BLUE SHIELD | Admitting: *Deleted

## 2014-09-05 DIAGNOSIS — I480 Paroxysmal atrial fibrillation: Secondary | ICD-10-CM

## 2014-09-05 DIAGNOSIS — Z954 Presence of other heart-valve replacement: Secondary | ICD-10-CM

## 2014-09-05 DIAGNOSIS — I359 Nonrheumatic aortic valve disorder, unspecified: Secondary | ICD-10-CM | POA: Diagnosis not present

## 2014-09-05 DIAGNOSIS — Z952 Presence of prosthetic heart valve: Secondary | ICD-10-CM

## 2014-09-05 LAB — POCT INR: INR: 1.9

## 2014-09-26 ENCOUNTER — Ambulatory Visit (INDEPENDENT_AMBULATORY_CARE_PROVIDER_SITE_OTHER): Payer: BLUE CROSS/BLUE SHIELD | Admitting: *Deleted

## 2014-09-26 DIAGNOSIS — I359 Nonrheumatic aortic valve disorder, unspecified: Secondary | ICD-10-CM

## 2014-09-26 DIAGNOSIS — Z954 Presence of other heart-valve replacement: Secondary | ICD-10-CM | POA: Diagnosis not present

## 2014-09-26 DIAGNOSIS — Z952 Presence of prosthetic heart valve: Secondary | ICD-10-CM

## 2014-09-26 DIAGNOSIS — I480 Paroxysmal atrial fibrillation: Secondary | ICD-10-CM

## 2014-09-26 LAB — POCT INR: INR: 1.8

## 2014-10-07 ENCOUNTER — Telehealth: Payer: Self-pay | Admitting: Cardiology

## 2014-10-07 MED ORDER — WARFARIN SODIUM 5 MG PO TABS: ORAL_TABLET | ORAL | Status: DC

## 2014-10-07 NOTE — Telephone Encounter (Signed)
Rx sent to [facility], patient notified

## 2014-10-07 NOTE — Telephone Encounter (Signed)
°  1. Which medications need to be refilled? Coumadin 5 mg (1 tab 5 days a week and a 1/2 tab 2 days a week)    2. Which pharmacy is medication to be sent to?[facility] on [address]   3. Do they need a 30 day or 90 day supply? Not specified   4. Would they like a call back once the medication has been sent to the pharmacy? Yes

## 2014-10-17 ENCOUNTER — Ambulatory Visit (INDEPENDENT_AMBULATORY_CARE_PROVIDER_SITE_OTHER): Payer: BLUE CROSS/BLUE SHIELD | Admitting: *Deleted

## 2014-10-17 DIAGNOSIS — I480 Paroxysmal atrial fibrillation: Secondary | ICD-10-CM | POA: Diagnosis not present

## 2014-10-17 DIAGNOSIS — Z952 Presence of prosthetic heart valve: Secondary | ICD-10-CM

## 2014-10-17 DIAGNOSIS — Z954 Presence of other heart-valve replacement: Secondary | ICD-10-CM | POA: Diagnosis not present

## 2014-10-17 DIAGNOSIS — I359 Nonrheumatic aortic valve disorder, unspecified: Secondary | ICD-10-CM | POA: Diagnosis not present

## 2014-10-17 LAB — POCT INR: INR: 1.6

## 2014-11-07 ENCOUNTER — Ambulatory Visit (INDEPENDENT_AMBULATORY_CARE_PROVIDER_SITE_OTHER): Payer: BLUE CROSS/BLUE SHIELD

## 2014-11-07 DIAGNOSIS — I359 Nonrheumatic aortic valve disorder, unspecified: Secondary | ICD-10-CM

## 2014-11-07 DIAGNOSIS — Z952 Presence of prosthetic heart valve: Secondary | ICD-10-CM

## 2014-11-07 DIAGNOSIS — I480 Paroxysmal atrial fibrillation: Secondary | ICD-10-CM

## 2014-11-07 DIAGNOSIS — Z954 Presence of other heart-valve replacement: Secondary | ICD-10-CM | POA: Diagnosis not present

## 2014-11-07 LAB — POCT INR: INR: 2.2

## 2014-12-05 ENCOUNTER — Ambulatory Visit (INDEPENDENT_AMBULATORY_CARE_PROVIDER_SITE_OTHER): Payer: BLUE CROSS/BLUE SHIELD | Admitting: Pharmacist

## 2014-12-05 DIAGNOSIS — Z952 Presence of prosthetic heart valve: Secondary | ICD-10-CM

## 2014-12-05 DIAGNOSIS — I359 Nonrheumatic aortic valve disorder, unspecified: Secondary | ICD-10-CM | POA: Diagnosis not present

## 2014-12-05 DIAGNOSIS — I480 Paroxysmal atrial fibrillation: Secondary | ICD-10-CM | POA: Diagnosis not present

## 2014-12-05 DIAGNOSIS — Z954 Presence of other heart-valve replacement: Secondary | ICD-10-CM

## 2014-12-05 LAB — POCT INR: INR: 3.5

## 2015-01-05 ENCOUNTER — Telehealth: Payer: Self-pay | Admitting: *Deleted

## 2015-01-05 NOTE — Telephone Encounter (Signed)
Returned call to patient.Advised I will have to check with billing the cost of a echo.Advised I will call you back when I find out the cost.

## 2015-01-05 NOTE — Telephone Encounter (Signed)
Returned call to patient no answer.LMTC. 

## 2015-01-05 NOTE — Telephone Encounter (Signed)
Pt says he just wanted to know how much will his echo cost with his insurance? If he is not there,please leave a message.

## 2015-01-05 NOTE — Telephone Encounter (Signed)
Returned call to patient no answer.Left message on personal voice I spoke to [place] in our insurance.She advised you will have to call your insurance and give them the CPT code [address] and tell them outpatient hospital.Our insurance does not have the cost will depend on your insurance plan.Advised without insurance the cost of echo is $2,079.

## 2015-01-05 NOTE — Telephone Encounter (Signed)
Patient calling for price for echocardiogram. He has contacted [employer] as well as our billing office. No one can give him any information. Wanted to speak to [facility] specifically.  Informed him this information typically handled by billing. Apparently billing will not give him this information. Requests call.

## 2015-01-09 ENCOUNTER — Ambulatory Visit (INDEPENDENT_AMBULATORY_CARE_PROVIDER_SITE_OTHER): Payer: BLUE CROSS/BLUE SHIELD | Admitting: Pharmacist

## 2015-01-09 DIAGNOSIS — Z954 Presence of other heart-valve replacement: Secondary | ICD-10-CM

## 2015-01-09 DIAGNOSIS — Z952 Presence of prosthetic heart valve: Secondary | ICD-10-CM

## 2015-01-09 DIAGNOSIS — I359 Nonrheumatic aortic valve disorder, unspecified: Secondary | ICD-10-CM | POA: Diagnosis not present

## 2015-01-09 DIAGNOSIS — I480 Paroxysmal atrial fibrillation: Secondary | ICD-10-CM | POA: Diagnosis not present

## 2015-01-09 LAB — POCT INR: INR: 2.9

## 2015-02-09 ENCOUNTER — Other Ambulatory Visit: Payer: Self-pay

## 2015-02-09 ENCOUNTER — Ambulatory Visit (HOSPITAL_COMMUNITY): Payer: BLUE CROSS/BLUE SHIELD | Attending: Cardiology

## 2015-02-09 DIAGNOSIS — I340 Nonrheumatic mitral (valve) insufficiency: Secondary | ICD-10-CM | POA: Insufficient documentation

## 2015-02-09 DIAGNOSIS — I071 Rheumatic tricuspid insufficiency: Secondary | ICD-10-CM | POA: Diagnosis not present

## 2015-02-09 DIAGNOSIS — Z954 Presence of other heart-valve replacement: Secondary | ICD-10-CM

## 2015-02-09 DIAGNOSIS — I517 Cardiomegaly: Secondary | ICD-10-CM | POA: Diagnosis not present

## 2015-02-09 DIAGNOSIS — I10 Essential (primary) hypertension: Secondary | ICD-10-CM

## 2015-02-09 DIAGNOSIS — I359 Nonrheumatic aortic valve disorder, unspecified: Secondary | ICD-10-CM

## 2015-02-09 DIAGNOSIS — I7121 Aneurysm of the ascending aorta, without rupture: Secondary | ICD-10-CM

## 2015-02-09 DIAGNOSIS — I351 Nonrheumatic aortic (valve) insufficiency: Secondary | ICD-10-CM | POA: Diagnosis not present

## 2015-02-09 DIAGNOSIS — I712 Thoracic aortic aneurysm, without rupture: Secondary | ICD-10-CM | POA: Diagnosis not present

## 2015-02-09 DIAGNOSIS — Z952 Presence of prosthetic heart valve: Secondary | ICD-10-CM

## 2015-02-20 ENCOUNTER — Ambulatory Visit (INDEPENDENT_AMBULATORY_CARE_PROVIDER_SITE_OTHER): Payer: BLUE CROSS/BLUE SHIELD | Admitting: *Deleted

## 2015-02-20 DIAGNOSIS — I480 Paroxysmal atrial fibrillation: Secondary | ICD-10-CM | POA: Diagnosis not present

## 2015-02-20 DIAGNOSIS — I359 Nonrheumatic aortic valve disorder, unspecified: Secondary | ICD-10-CM | POA: Diagnosis not present

## 2015-02-20 DIAGNOSIS — Z954 Presence of other heart-valve replacement: Secondary | ICD-10-CM | POA: Diagnosis not present

## 2015-02-20 DIAGNOSIS — Z952 Presence of prosthetic heart valve: Secondary | ICD-10-CM

## 2015-02-20 LAB — POCT INR: INR: 2.4

## 2015-04-03 ENCOUNTER — Ambulatory Visit (INDEPENDENT_AMBULATORY_CARE_PROVIDER_SITE_OTHER): Payer: BLUE CROSS/BLUE SHIELD | Admitting: *Deleted

## 2015-04-03 DIAGNOSIS — I359 Nonrheumatic aortic valve disorder, unspecified: Secondary | ICD-10-CM | POA: Diagnosis not present

## 2015-04-03 DIAGNOSIS — I480 Paroxysmal atrial fibrillation: Secondary | ICD-10-CM | POA: Diagnosis not present

## 2015-04-03 DIAGNOSIS — Z952 Presence of prosthetic heart valve: Secondary | ICD-10-CM

## 2015-04-03 DIAGNOSIS — Z954 Presence of other heart-valve replacement: Secondary | ICD-10-CM | POA: Diagnosis not present

## 2015-04-03 LAB — POCT INR: INR: 5.3

## 2015-04-15 ENCOUNTER — Ambulatory Visit (INDEPENDENT_AMBULATORY_CARE_PROVIDER_SITE_OTHER): Payer: BLUE CROSS/BLUE SHIELD | Admitting: *Deleted

## 2015-04-15 DIAGNOSIS — I480 Paroxysmal atrial fibrillation: Secondary | ICD-10-CM

## 2015-04-15 DIAGNOSIS — Z954 Presence of other heart-valve replacement: Secondary | ICD-10-CM | POA: Diagnosis not present

## 2015-04-15 DIAGNOSIS — I359 Nonrheumatic aortic valve disorder, unspecified: Secondary | ICD-10-CM

## 2015-04-15 DIAGNOSIS — Z952 Presence of prosthetic heart valve: Secondary | ICD-10-CM

## 2015-04-15 LAB — POCT INR: INR: 2

## 2015-05-08 ENCOUNTER — Ambulatory Visit (INDEPENDENT_AMBULATORY_CARE_PROVIDER_SITE_OTHER): Payer: BLUE CROSS/BLUE SHIELD | Admitting: Pharmacist

## 2015-05-08 DIAGNOSIS — I359 Nonrheumatic aortic valve disorder, unspecified: Secondary | ICD-10-CM

## 2015-05-08 DIAGNOSIS — Z952 Presence of prosthetic heart valve: Secondary | ICD-10-CM

## 2015-05-08 DIAGNOSIS — I480 Paroxysmal atrial fibrillation: Secondary | ICD-10-CM | POA: Diagnosis not present

## 2015-05-08 DIAGNOSIS — Z954 Presence of other heart-valve replacement: Secondary | ICD-10-CM | POA: Diagnosis not present

## 2015-05-08 LAB — POCT INR: INR: 3.9

## 2015-05-24 ENCOUNTER — Other Ambulatory Visit: Payer: Self-pay | Admitting: Cardiology

## 2015-06-05 ENCOUNTER — Ambulatory Visit (INDEPENDENT_AMBULATORY_CARE_PROVIDER_SITE_OTHER): Payer: BLUE CROSS/BLUE SHIELD | Admitting: *Deleted

## 2015-06-05 DIAGNOSIS — Z954 Presence of other heart-valve replacement: Secondary | ICD-10-CM

## 2015-06-05 DIAGNOSIS — I359 Nonrheumatic aortic valve disorder, unspecified: Secondary | ICD-10-CM

## 2015-06-05 DIAGNOSIS — I480 Paroxysmal atrial fibrillation: Secondary | ICD-10-CM | POA: Diagnosis not present

## 2015-06-05 DIAGNOSIS — Z952 Presence of prosthetic heart valve: Secondary | ICD-10-CM

## 2015-06-05 LAB — POCT INR: INR: 3

## 2015-06-08 ENCOUNTER — Ambulatory Visit (INDEPENDENT_AMBULATORY_CARE_PROVIDER_SITE_OTHER): Payer: BLUE CROSS/BLUE SHIELD | Admitting: Cardiology

## 2015-06-08 ENCOUNTER — Encounter: Payer: Self-pay | Admitting: Cardiology

## 2015-06-08 VITALS — BP 132/80 | HR 64 | Ht 70.0 in | Wt 193.1 lb

## 2015-06-08 DIAGNOSIS — I712 Thoracic aortic aneurysm, without rupture: Secondary | ICD-10-CM

## 2015-06-08 DIAGNOSIS — Z952 Presence of prosthetic heart valve: Secondary | ICD-10-CM

## 2015-06-08 DIAGNOSIS — Z954 Presence of other heart-valve replacement: Secondary | ICD-10-CM

## 2015-06-08 DIAGNOSIS — E785 Hyperlipidemia, unspecified: Secondary | ICD-10-CM

## 2015-06-08 DIAGNOSIS — Z7901 Long term (current) use of anticoagulants: Secondary | ICD-10-CM

## 2015-06-08 DIAGNOSIS — I7121 Aneurysm of the ascending aorta, without rupture: Secondary | ICD-10-CM

## 2015-06-08 MED ORDER — WARFARIN SODIUM 5 MG PO TABS: 5.0000 mg | ORAL_TABLET | Freq: Once | ORAL | Status: DC

## 2015-06-08 MED ORDER — ATORVASTATIN CALCIUM 80 MG PO TABS: 80.0000 mg | ORAL_TABLET | Freq: Every day | ORAL | Status: DC

## 2015-06-08 NOTE — Progress Notes (Signed)
[name] Date of Birth: 04-25-1956 Medical Record #[id number]  History of Present Illness: [name] [name] is seen for followup s/p AVR. He is  status post aortic valve replacement with a #23 mm St. Jude prosthesis in 1988. 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 He has been on chronic anticoagulation with Coumadin.  In addition to his valvular disease he also has a history of hyperlipidemia and hypertension. In 2015 he was diagnosed with an ascending thoracic aneurysm. He underwent redo AVR and aortic root replacement with a #23 St. Jude AV and 25 mm Hemashield conduit by Dr. Donata Clay. His post op course was complicated by Afib and sternal wound infection. He has no recurrence of Afib. His sternum healed without complication.  On follow up today he reports some asthma flairs with cold weather.   No chest pain or SOB. Denies palpitations. No edema. He is longer able to afford Vytorin. He complains about the cost of copay for having his INR checks.   Current Outpatient Prescriptions on File Prior to Visit  Medication Sig Dispense Refill  . albuterol (PROVENTIL HFA;VENTOLIN HFA) 108 (90 BASE) MCG/ACT inhaler Inhale 2 puffs into the lungs every 6 (six) hours as needed for wheezing or shortness of breath.    . ALPRAZolam (XANAX) 0.5 MG tablet Take 0.5 mg by mouth daily as needed for anxiety.     . diphenhydramine-acetaminophen (TYLENOL PM) 25-500 MG TABS Take 1 tablet by mouth at bedtime as needed (sleep).    . metoprolol tartrate (LOPRESSOR) 25 MG tablet Take 0.5 tablets (12.5 mg total) by mouth 2 (two) times daily. 30 tablet 11  . polyethylene glycol (MIRALAX / GLYCOLAX) packet Take 17 g by mouth 2 (two) times daily. 24 each 0  . ezetimibe-simvastatin (VYTORIN) 10-20 MG per tablet Take 1 tablet by mouth at bedtime. (Patient not taking: Reported on 06/08/2015) 28 tablet 0   No current facility-administered medications on file prior to visit.    No Known  Allergies  Past Medical History  Diagnosis Date  . Chest pain, atypical   . Hypertension   . Hyperlipidemia   . Chronic anticoagulation   . S/P AVR     for endocarditis  . Ascending aortic aneurysm (HCC) 01/07/2014  . Anxiety   . Depression   . Asthma     Past Surgical History  Procedure Laterality Date  . Aortic valve replacement  1988    23mm St. Jude valve  . Doppler echocardiography  06/12/1996    EF 55-60%  . Cardiac catheterization    . Hand surgery    . [place] procedure N/A 01/23/2014    Procedure: BENTALL PROCEDURE;  Surgeon: Kerin Perna, MD;  Location: [facility] OR;  Service: Open Heart Surgery;  Laterality: N/A;  . Aortic valve replacement N/A 01/23/2014    Procedure: REDO AORTIC VALVE REPLACEMENT (AVR) with 23 St. Jude Mechanical Aortic Valved Graft;  Surgeon: Kerin Perna, MD;  Location: [facility] OR;  Service: Open Heart Surgery;  Laterality: N/A;  . Intraoperative transesophageal echocardiogram N/A 01/23/2014    Procedure: INTRAOPERATIVE TRANSESOPHAGEAL ECHOCARDIOGRAM;  Surgeon: Kerin Perna, MD;  Location: [facility] OR;  Service: Open Heart Surgery;  Laterality: N/A;  . I&d extremity N/A 02/22/2014    Procedure: IRRIGATION AND DEBRIDEMENT EXTREMITY-CHEST;  Surgeon: Kerin Perna, MD;  Location: [facility] OR;  Service: Vascular;  Laterality: N/A;  PULSE LAVAGE  . Application of wound vac N/A 02/22/2014    Procedure: APPLICATION OF WOUND VAC;  Surgeon: Kerin Perna, MD;  Location: [facility] OR;  Service: Vascular;  Laterality: N/A;  . Left heart catheterization with coronary angiogram N/A 01/07/2014    Procedure: LEFT HEART CATHETERIZATION WITH CORONARY ANGIOGRAM;  Surgeon: Lennette Bihari, MD;  Location: [facility] CATH LAB;  Service: Cardiovascular;  Laterality: N/A;    History  Smoking status  . Former Smoker  . Quit date: 05/04/1986  Smokeless tobacco  . Never Used    History  Alcohol Use  . 1.8 oz/week  . 3 Shots of liquor per week    Comment: daily    Family History  Problem  Relation Age of Onset  . Cancer Mother   . Healthy Brother   . Healthy Brother     Review of Systems: As noted in HPI.  All other systems were reviewed and are negative.  Physical Exam: BP 132/80 mmHg  Pulse 64  Ht  (1.778 m)  Wt 87.601 kg (193 lb 2 oz)  BMI 27.71 kg/m2 He is a pleasant white male in no acute distress. HEENT exam is unremarkable.  Neck is supple without JVD, adenopathy, thyromegaly, or bruits. Carotid upstrokes are normal. Lungs are clear. Cardiac exam reveals a regular rate and rhythm with a good mechanical aortic valve click. There are no murmurs or rubs. Chest reveals his sternal incision has healed.  Abdomen is soft and nontender without masses or bruits. Extremities are without edema. Equal pulses are 2+ and symmetric. Skin is warm and dry. She is alert and oriented x3. Cranial nerves II through XII are intact.  LABORATORY DATA:   Lab Results  Component Value Date   WBC 12.1* 03/03/2014   HGB 10.5* 03/03/2014   HCT 33.0* 03/03/2014   PLT 322 03/03/2014   GLUCOSE 109* 03/05/2014   CHOL 127 04/05/2013   TRIG 64.0 04/05/2013   HDL 50.90 04/05/2013   LDLDIRECT 181.4 11/16/2012   LDLCALC 63 04/05/2013   ALT 59* 02/24/2014   AST 38* 02/24/2014   NA 142 03/05/2014   K 4.0 03/05/2014   CL 105 03/05/2014   CREATININE 1.96* 03/05/2014   BUN 26* 03/05/2014   CO2 25 03/05/2014   INR 3.0 06/05/2015   HGBA1C 6.1* 01/20/2014   Ecg today shows NSR with possible old septal infarct. Otherwise normal. I have personally reviewed and interpreted this study.  Echo: 02/09/15:Study Conclusions  - Left ventricle: The cavity size was normal. There was mild concentric hypertrophy. Systolic function was normal. The estimated ejection fraction was in the range of 60% to 65%. Wall motion was normal; there were no regional wall motion abnormalities. Left ventricular diastolic function parameters were normal. - Aortic valve: A mechanical prosthesis was  present. Transvalvular velocity was within the normal range. There was no stenosis. There was mild regurgitation. Peak velocity (S): 256 cm/s. Slightly increased from 11/2013 at 2.1 cm/s. Mean gradient (S): 17 mm Hg. VTI ratio of LVOT to aortic valve: 0.33. Valve area (VTI): 1.04 cm^2. Valve area (Vmax): 0.86 cm^2. Valve area (Vmean): 0.78 cm^2. - Mitral valve: There was trivial regurgitation. - Right ventricle: The cavity size was normal. Wall thickness was normal. Systolic function was normal. - Tricuspid valve: There was trivial regurgitation. - Pulmonary arteries: PA peak pressure: 18 mm Hg (S). - Inferior vena cava: The vessel was normal in size. The respirophasic diameter changes were in the normal range (= 50%), consistent with normal central venous pressure.   Assessment / Plan: 1. S/p redo mechanical AVR with Aortic root grafting. He  has made a good recovery. Valve sound is normal on exam. Follow up Echo in October 2016 showed normal valve fucntion. INR therapeutic at 3.0.  Needs SBE prophylaxis.   2. HTN- controlled.  3. Hyperlipidemia - unable to afford Vytorin. Will switch to lipitor 80 mg daily. Repeat fasting lab work in 3 months.  4. Sternal wound infection. Now healed.   I think one of the reasons his coumadin check is more expensive is that he is getting a specialist copay for our Coumadin clinic. He may have a lower copay with his primary care. If so, he may need to have his coumadin monitored and followed by Dr. Clovis Riley.

## 2015-06-08 NOTE — Patient Instructions (Addendum)
Stop taking Vytorin. Start lipitor 80 mg daily. We will check fasting lab work in 3 months.  I will see you in one year

## 2015-06-08 NOTE — Addendum Note (Signed)
Addended by: Neoma Laming on: 06/08/2015 01:36 PM   Modules accepted: Orders

## 2015-07-03 ENCOUNTER — Ambulatory Visit (INDEPENDENT_AMBULATORY_CARE_PROVIDER_SITE_OTHER): Payer: BLUE CROSS/BLUE SHIELD | Admitting: *Deleted

## 2015-07-03 DIAGNOSIS — Z954 Presence of other heart-valve replacement: Secondary | ICD-10-CM | POA: Diagnosis not present

## 2015-07-03 DIAGNOSIS — I359 Nonrheumatic aortic valve disorder, unspecified: Secondary | ICD-10-CM

## 2015-07-03 DIAGNOSIS — I480 Paroxysmal atrial fibrillation: Secondary | ICD-10-CM | POA: Diagnosis not present

## 2015-07-03 DIAGNOSIS — Z952 Presence of prosthetic heart valve: Secondary | ICD-10-CM

## 2015-07-03 LAB — POCT INR: INR: 5

## 2015-07-17 ENCOUNTER — Ambulatory Visit (INDEPENDENT_AMBULATORY_CARE_PROVIDER_SITE_OTHER): Payer: BLUE CROSS/BLUE SHIELD | Admitting: Pharmacist

## 2015-07-17 DIAGNOSIS — Z952 Presence of prosthetic heart valve: Secondary | ICD-10-CM

## 2015-07-17 DIAGNOSIS — I359 Nonrheumatic aortic valve disorder, unspecified: Secondary | ICD-10-CM | POA: Diagnosis not present

## 2015-07-17 DIAGNOSIS — I480 Paroxysmal atrial fibrillation: Secondary | ICD-10-CM | POA: Diagnosis not present

## 2015-07-17 DIAGNOSIS — Z954 Presence of other heart-valve replacement: Secondary | ICD-10-CM

## 2015-07-17 LAB — POCT INR: INR: 3.7

## 2015-08-07 ENCOUNTER — Ambulatory Visit (INDEPENDENT_AMBULATORY_CARE_PROVIDER_SITE_OTHER): Payer: BLUE CROSS/BLUE SHIELD | Admitting: *Deleted

## 2015-08-07 DIAGNOSIS — I359 Nonrheumatic aortic valve disorder, unspecified: Secondary | ICD-10-CM

## 2015-08-07 DIAGNOSIS — Z952 Presence of prosthetic heart valve: Secondary | ICD-10-CM

## 2015-08-07 DIAGNOSIS — Z954 Presence of other heart-valve replacement: Secondary | ICD-10-CM | POA: Diagnosis not present

## 2015-08-07 DIAGNOSIS — I480 Paroxysmal atrial fibrillation: Secondary | ICD-10-CM | POA: Diagnosis not present

## 2015-08-07 LAB — POCT INR: INR: 2.9

## 2015-08-18 ENCOUNTER — Other Ambulatory Visit: Payer: Self-pay | Admitting: Cardiology

## 2015-08-18 NOTE — Telephone Encounter (Signed)
Rx(s) sent to pharmacy electronically.  

## 2015-08-26 ENCOUNTER — Telehealth: Payer: Self-pay | Admitting: Cardiology

## 2015-08-26 DIAGNOSIS — E785 Hyperlipidemia, unspecified: Secondary | ICD-10-CM

## 2015-08-26 DIAGNOSIS — Z79899 Other long term (current) drug therapy: Secondary | ICD-10-CM

## 2015-08-26 NOTE — Telephone Encounter (Signed)
New message     Pt is due to have labs drawn.  He want to have them drawn on 09-11-15 at [address] office--same time as his PT/INR.  Please put order in computer if ok to have them drawn and leave msg on vm to let him know it is ok.  He knows to be fasting.

## 2015-08-26 NOTE — Telephone Encounter (Signed)
Cancelled current labs form 05/2015 [facility]/15 Left message on patient's voicemail

## 2015-09-11 ENCOUNTER — Other Ambulatory Visit (INDEPENDENT_AMBULATORY_CARE_PROVIDER_SITE_OTHER): Payer: BLUE CROSS/BLUE SHIELD | Admitting: *Deleted

## 2015-09-11 ENCOUNTER — Ambulatory Visit (INDEPENDENT_AMBULATORY_CARE_PROVIDER_SITE_OTHER): Payer: BLUE CROSS/BLUE SHIELD | Admitting: *Deleted

## 2015-09-11 DIAGNOSIS — Z79899 Other long term (current) drug therapy: Secondary | ICD-10-CM

## 2015-09-11 DIAGNOSIS — I480 Paroxysmal atrial fibrillation: Secondary | ICD-10-CM

## 2015-09-11 DIAGNOSIS — I359 Nonrheumatic aortic valve disorder, unspecified: Secondary | ICD-10-CM

## 2015-09-11 DIAGNOSIS — Z954 Presence of other heart-valve replacement: Secondary | ICD-10-CM | POA: Diagnosis not present

## 2015-09-11 DIAGNOSIS — E785 Hyperlipidemia, unspecified: Secondary | ICD-10-CM | POA: Diagnosis not present

## 2015-09-11 DIAGNOSIS — Z952 Presence of prosthetic heart valve: Secondary | ICD-10-CM

## 2015-09-11 LAB — BASIC METABOLIC PANEL
BUN: 22 mg/dL (ref 7–25)
CO2: 26 mmol/L (ref 20–31)
Calcium: 9.1 mg/dL (ref 8.6–10.3)
Chloride: 105 mmol/L (ref 98–110)
Creat: 0.95 mg/dL (ref 0.70–1.33)
GLUCOSE: 108 mg/dL — AB (ref 65–99)
POTASSIUM: 4.1 mmol/L (ref 3.5–5.3)
SODIUM: 139 mmol/L (ref 135–146)

## 2015-09-11 LAB — LIPID PANEL
CHOL/HDL RATIO: 2.5 ratio (ref <=5.0)
CHOLESTEROL: 94 mg/dL — AB (ref 125–200)
HDL: 38 mg/dL — ABNORMAL LOW (ref >=40)
LDL Cholesterol: 45 mg/dL (ref <130)
Triglycerides: 53 mg/dL (ref <150)
VLDL: 11 mg/dL (ref <30)

## 2015-09-11 LAB — HEPATIC FUNCTION PANEL
ALBUMIN: 4.2 g/dL (ref 3.6–5.1)
ALT: 14 U/L (ref 9–46)
AST: 17 U/L (ref 10–35)
Alkaline Phosphatase: 76 U/L (ref 40–115)
BILIRUBIN DIRECT: 0.2 mg/dL (ref <=0.2)
Indirect Bilirubin: 0.6 mg/dL (ref 0.2–1.2)
Total Bilirubin: 0.8 mg/dL (ref 0.2–1.2)
Total Protein: 6.5 g/dL (ref 6.1–8.1)

## 2015-09-11 LAB — POCT INR: INR: 2.3

## 2015-09-11 NOTE — Addendum Note (Signed)
Addended by: Tonita PhoenixBOWDEN, ROBIN K on: 09/11/2015 01:01 PM   Modules accepted: Orders

## 2015-09-17 ENCOUNTER — Encounter: Payer: Self-pay | Admitting: *Deleted

## 2015-09-17 ENCOUNTER — Telehealth: Payer: Self-pay | Admitting: Cardiology

## 2015-09-17 NOTE — Telephone Encounter (Signed)
New message      Returning a call to the nurse.  Please call before 2pm today

## 2015-09-17 NOTE — Telephone Encounter (Signed)
Returned call to pt and gave him, Dr Elvis CoilJordan's results. He verbalized understanding and asked for the results to be mailed to him. Results letter mailed to pt.

## 2015-10-16 ENCOUNTER — Ambulatory Visit (INDEPENDENT_AMBULATORY_CARE_PROVIDER_SITE_OTHER): Payer: BLUE CROSS/BLUE SHIELD | Admitting: *Deleted

## 2015-10-16 DIAGNOSIS — Z954 Presence of other heart-valve replacement: Secondary | ICD-10-CM | POA: Diagnosis not present

## 2015-10-16 DIAGNOSIS — I359 Nonrheumatic aortic valve disorder, unspecified: Secondary | ICD-10-CM | POA: Diagnosis not present

## 2015-10-16 DIAGNOSIS — Z952 Presence of prosthetic heart valve: Secondary | ICD-10-CM

## 2015-10-16 DIAGNOSIS — I480 Paroxysmal atrial fibrillation: Secondary | ICD-10-CM | POA: Diagnosis not present

## 2015-10-16 LAB — POCT INR: INR: 1.7

## 2015-11-06 ENCOUNTER — Ambulatory Visit (INDEPENDENT_AMBULATORY_CARE_PROVIDER_SITE_OTHER): Payer: BLUE CROSS/BLUE SHIELD | Admitting: *Deleted

## 2015-11-06 DIAGNOSIS — Z954 Presence of other heart-valve replacement: Secondary | ICD-10-CM | POA: Diagnosis not present

## 2015-11-06 DIAGNOSIS — I359 Nonrheumatic aortic valve disorder, unspecified: Secondary | ICD-10-CM | POA: Diagnosis not present

## 2015-11-06 DIAGNOSIS — I480 Paroxysmal atrial fibrillation: Secondary | ICD-10-CM

## 2015-11-06 DIAGNOSIS — Z952 Presence of prosthetic heart valve: Secondary | ICD-10-CM

## 2015-11-06 LAB — POCT INR: INR: 3.7

## 2015-12-04 ENCOUNTER — Ambulatory Visit (INDEPENDENT_AMBULATORY_CARE_PROVIDER_SITE_OTHER): Payer: BLUE CROSS/BLUE SHIELD | Admitting: *Deleted

## 2015-12-04 DIAGNOSIS — I480 Paroxysmal atrial fibrillation: Secondary | ICD-10-CM

## 2015-12-04 DIAGNOSIS — I359 Nonrheumatic aortic valve disorder, unspecified: Secondary | ICD-10-CM

## 2015-12-04 DIAGNOSIS — Z954 Presence of other heart-valve replacement: Secondary | ICD-10-CM | POA: Diagnosis not present

## 2015-12-04 DIAGNOSIS — Z952 Presence of prosthetic heart valve: Secondary | ICD-10-CM

## 2015-12-04 LAB — POCT INR: INR: 3.4

## 2016-01-08 ENCOUNTER — Ambulatory Visit (INDEPENDENT_AMBULATORY_CARE_PROVIDER_SITE_OTHER): Payer: BLUE CROSS/BLUE SHIELD | Admitting: Pharmacist

## 2016-01-08 DIAGNOSIS — Z952 Presence of prosthetic heart valve: Secondary | ICD-10-CM

## 2016-01-08 DIAGNOSIS — I480 Paroxysmal atrial fibrillation: Secondary | ICD-10-CM

## 2016-01-08 DIAGNOSIS — I359 Nonrheumatic aortic valve disorder, unspecified: Secondary | ICD-10-CM

## 2016-01-08 DIAGNOSIS — Z954 Presence of other heart-valve replacement: Secondary | ICD-10-CM

## 2016-01-08 LAB — POCT INR: INR: 3.1

## 2016-02-12 ENCOUNTER — Ambulatory Visit (INDEPENDENT_AMBULATORY_CARE_PROVIDER_SITE_OTHER): Payer: Self-pay | Admitting: *Deleted

## 2016-02-12 DIAGNOSIS — I359 Nonrheumatic aortic valve disorder, unspecified: Secondary | ICD-10-CM

## 2016-02-12 DIAGNOSIS — Z952 Presence of prosthetic heart valve: Secondary | ICD-10-CM

## 2016-02-12 DIAGNOSIS — I480 Paroxysmal atrial fibrillation: Secondary | ICD-10-CM

## 2016-02-12 LAB — POCT INR: INR: 2.7

## 2016-04-01 ENCOUNTER — Ambulatory Visit (INDEPENDENT_AMBULATORY_CARE_PROVIDER_SITE_OTHER): Payer: Self-pay | Admitting: *Deleted

## 2016-04-01 DIAGNOSIS — I359 Nonrheumatic aortic valve disorder, unspecified: Secondary | ICD-10-CM

## 2016-04-01 DIAGNOSIS — Z952 Presence of prosthetic heart valve: Secondary | ICD-10-CM

## 2016-04-01 DIAGNOSIS — I480 Paroxysmal atrial fibrillation: Secondary | ICD-10-CM

## 2016-04-01 LAB — POCT INR: INR: 3.6

## 2016-05-13 ENCOUNTER — Ambulatory Visit (INDEPENDENT_AMBULATORY_CARE_PROVIDER_SITE_OTHER): Payer: Managed Care, Other (non HMO) | Admitting: Pharmacist

## 2016-05-13 DIAGNOSIS — Z952 Presence of prosthetic heart valve: Secondary | ICD-10-CM

## 2016-05-13 DIAGNOSIS — I480 Paroxysmal atrial fibrillation: Secondary | ICD-10-CM

## 2016-05-13 DIAGNOSIS — I359 Nonrheumatic aortic valve disorder, unspecified: Secondary | ICD-10-CM

## 2016-05-13 LAB — POCT INR: INR: 3.4

## 2016-06-01 IMAGING — CR DG CHEST 1V PORT
1 series · 1 of 1 positions shown · non-contrast
Comparison: 01/20/2014.

CLINICAL DATA: Postop heart surgery

EXAM:
PORTABLE CHEST - 1 VIEW

[AP]
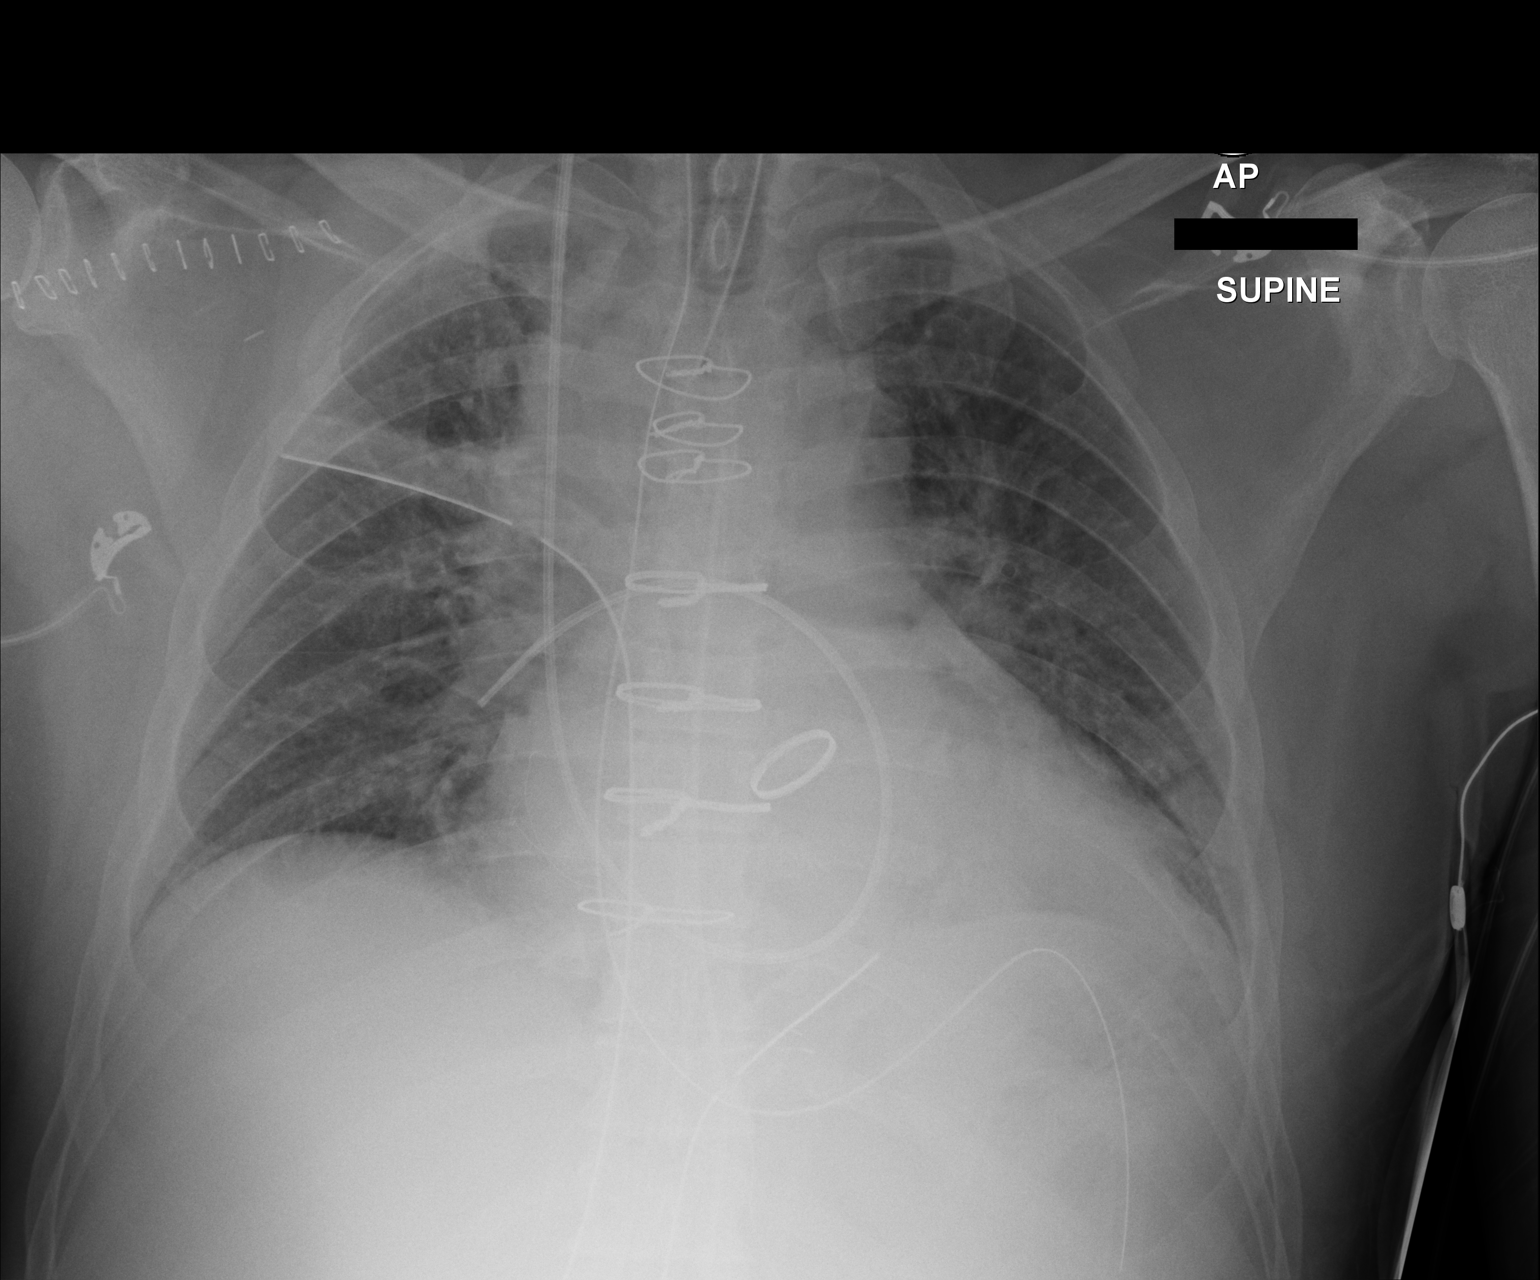

[1 of 1 positions shown; findings below may reference images not displayed]

FINDINGS: Endotracheal tube terminates 5.5 cm above the carina.

Right IJ Swan-Ganz catheter terminates distally in the right lower
lobe pulmonary artery. Withdrawal approximately 3 cm is suggested.

Bilateral chest tubes and mediastinal drain.  No pneumothorax.

Left basilar atelectasis.

Cardiomegaly.  Prosthetic valve.  Median sternotomy.

Surgical clips overlying the right upper chest wall/axilla.

Enteric tube courses into the stomach.
IMPRESSION: Endotracheal tube terminates 5.5 cm above the carina.

Right IJ Swan-Ganz catheter terminates distally in the right lower
lobe pulmonary artery. Withdrawal approximately 3 cm is suggested.

Bilateral chest tubes and mediastinal drain.  No pneumothorax.

## 2016-06-02 IMAGING — CR DG CHEST 1V PORT
1 series · 1 of 1 positions shown · non-contrast
Comparison: 01/23/2014

CLINICAL DATA: Postop from aortic valve replacement. On ventilator.

EXAM:
PORTABLE CHEST - 1 VIEW

[AP]
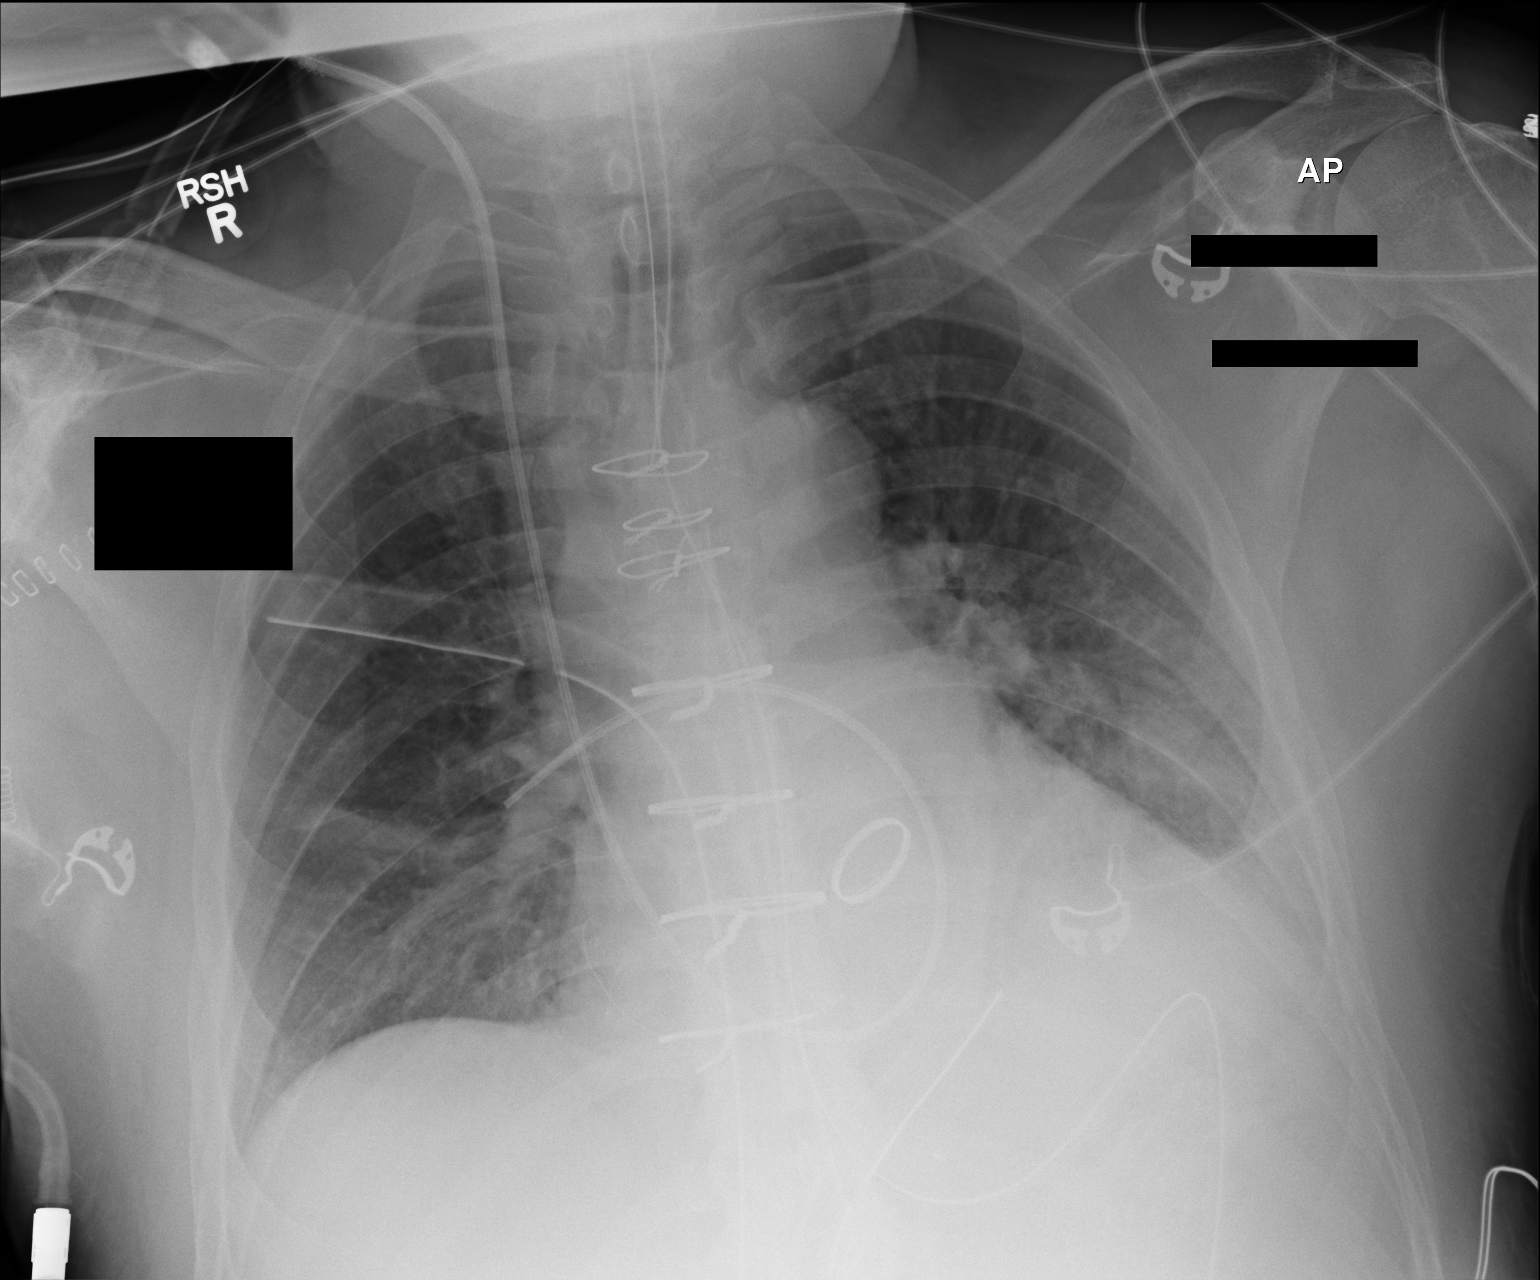

[1 of 1 positions shown; findings below may reference images not displayed]

FINDINGS: Support lines and tubes in appropriate position. No pneumothorax
identified on today's exam.

Mild increased atelectasis noted in both lung bases since prior
exam. Cardiomegaly remains stable.
IMPRESSION: Mild increase in bibasilar atelectasis since prior exam. No definite
pneumothorax visualized.

## 2016-06-06 IMAGING — CR DG CHEST 2V
2 series · 2 of 2 positions shown · non-contrast
Comparison: 01/27/2014

CLINICAL DATA: Pleural effusions, intermittent shortness of breath,
personal history of hypertension, asthma, hyperlipidemia, ascending
aortic aneurysm, prior AVR, former smoker

EXAM:
CHEST  2 VIEW

[w chest pa]
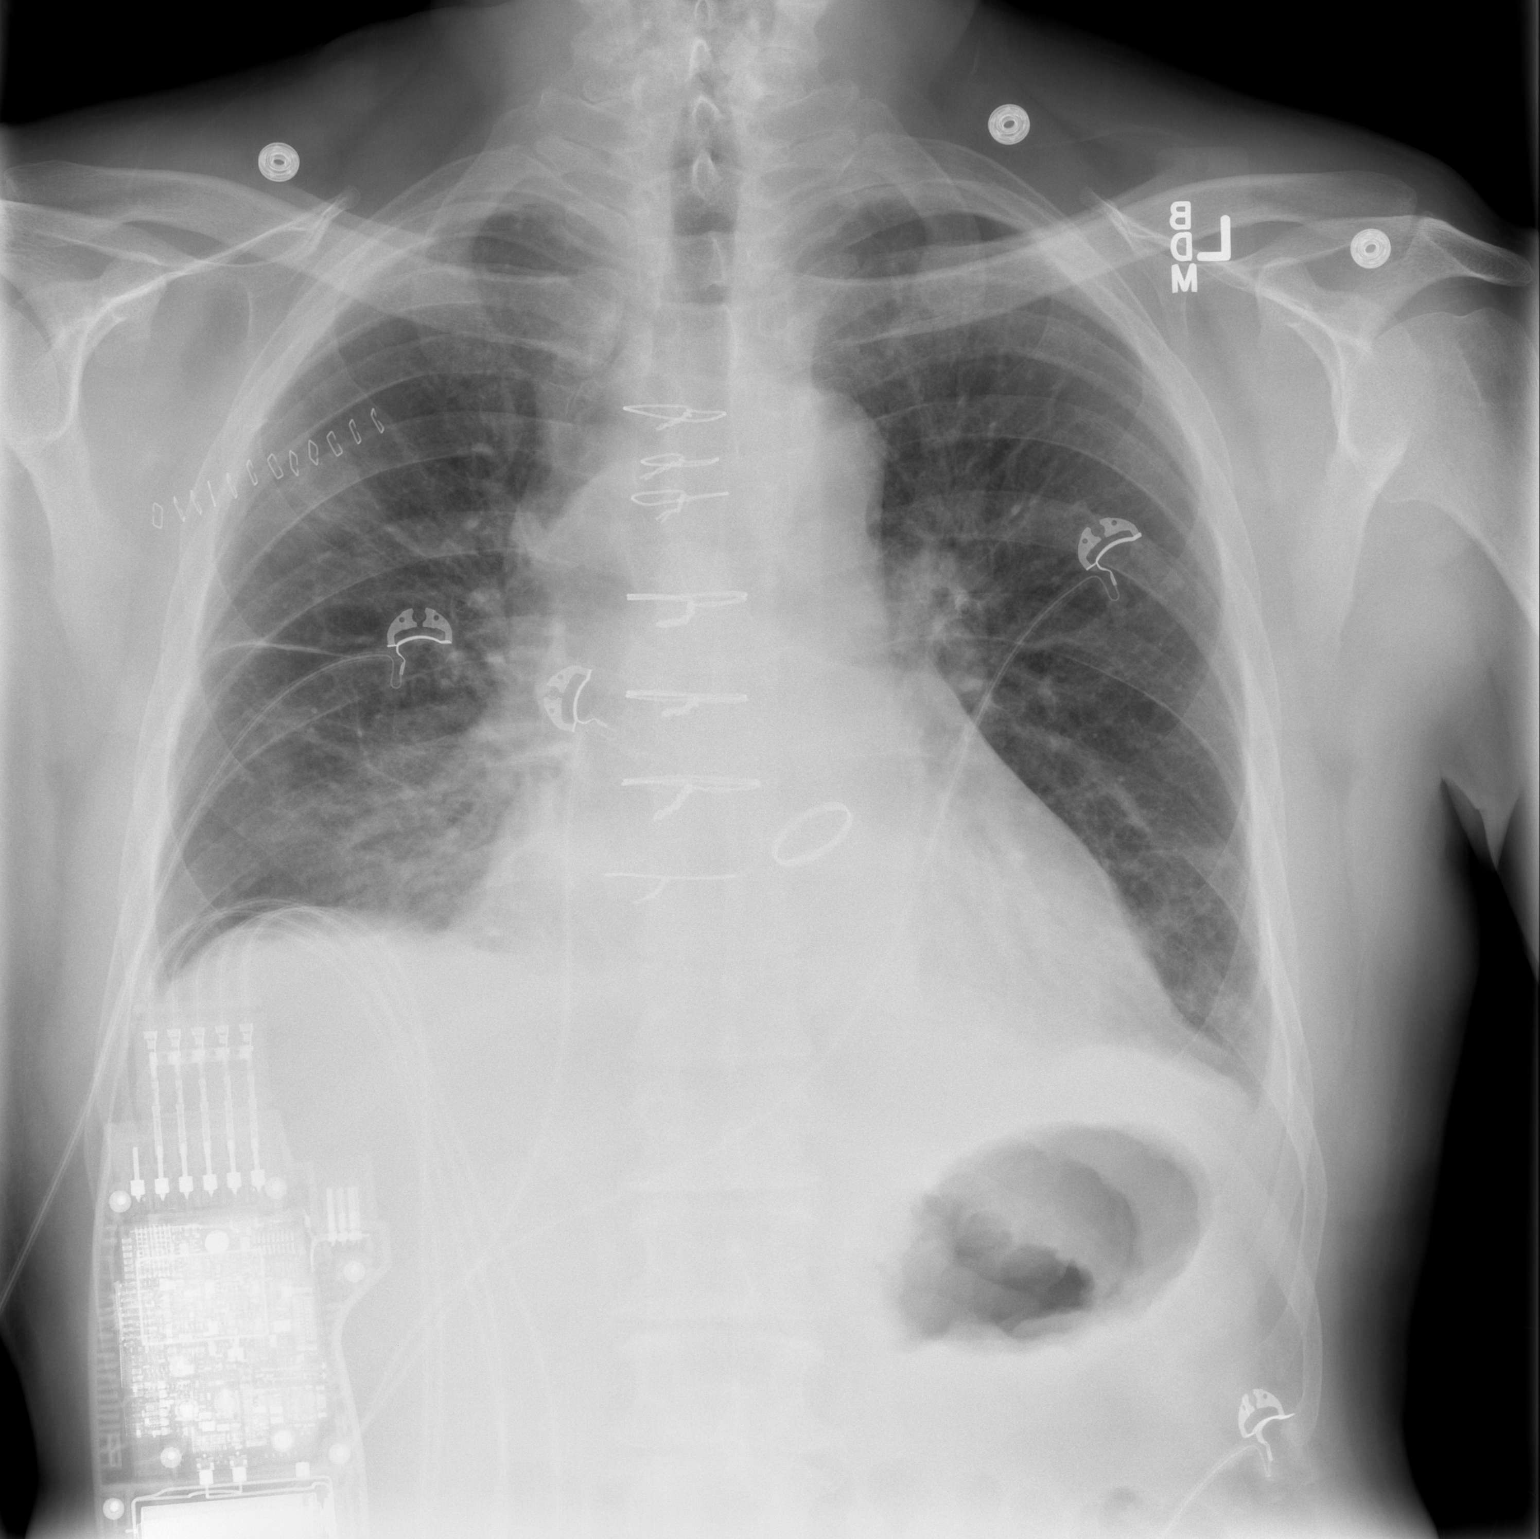

[w chest lat]
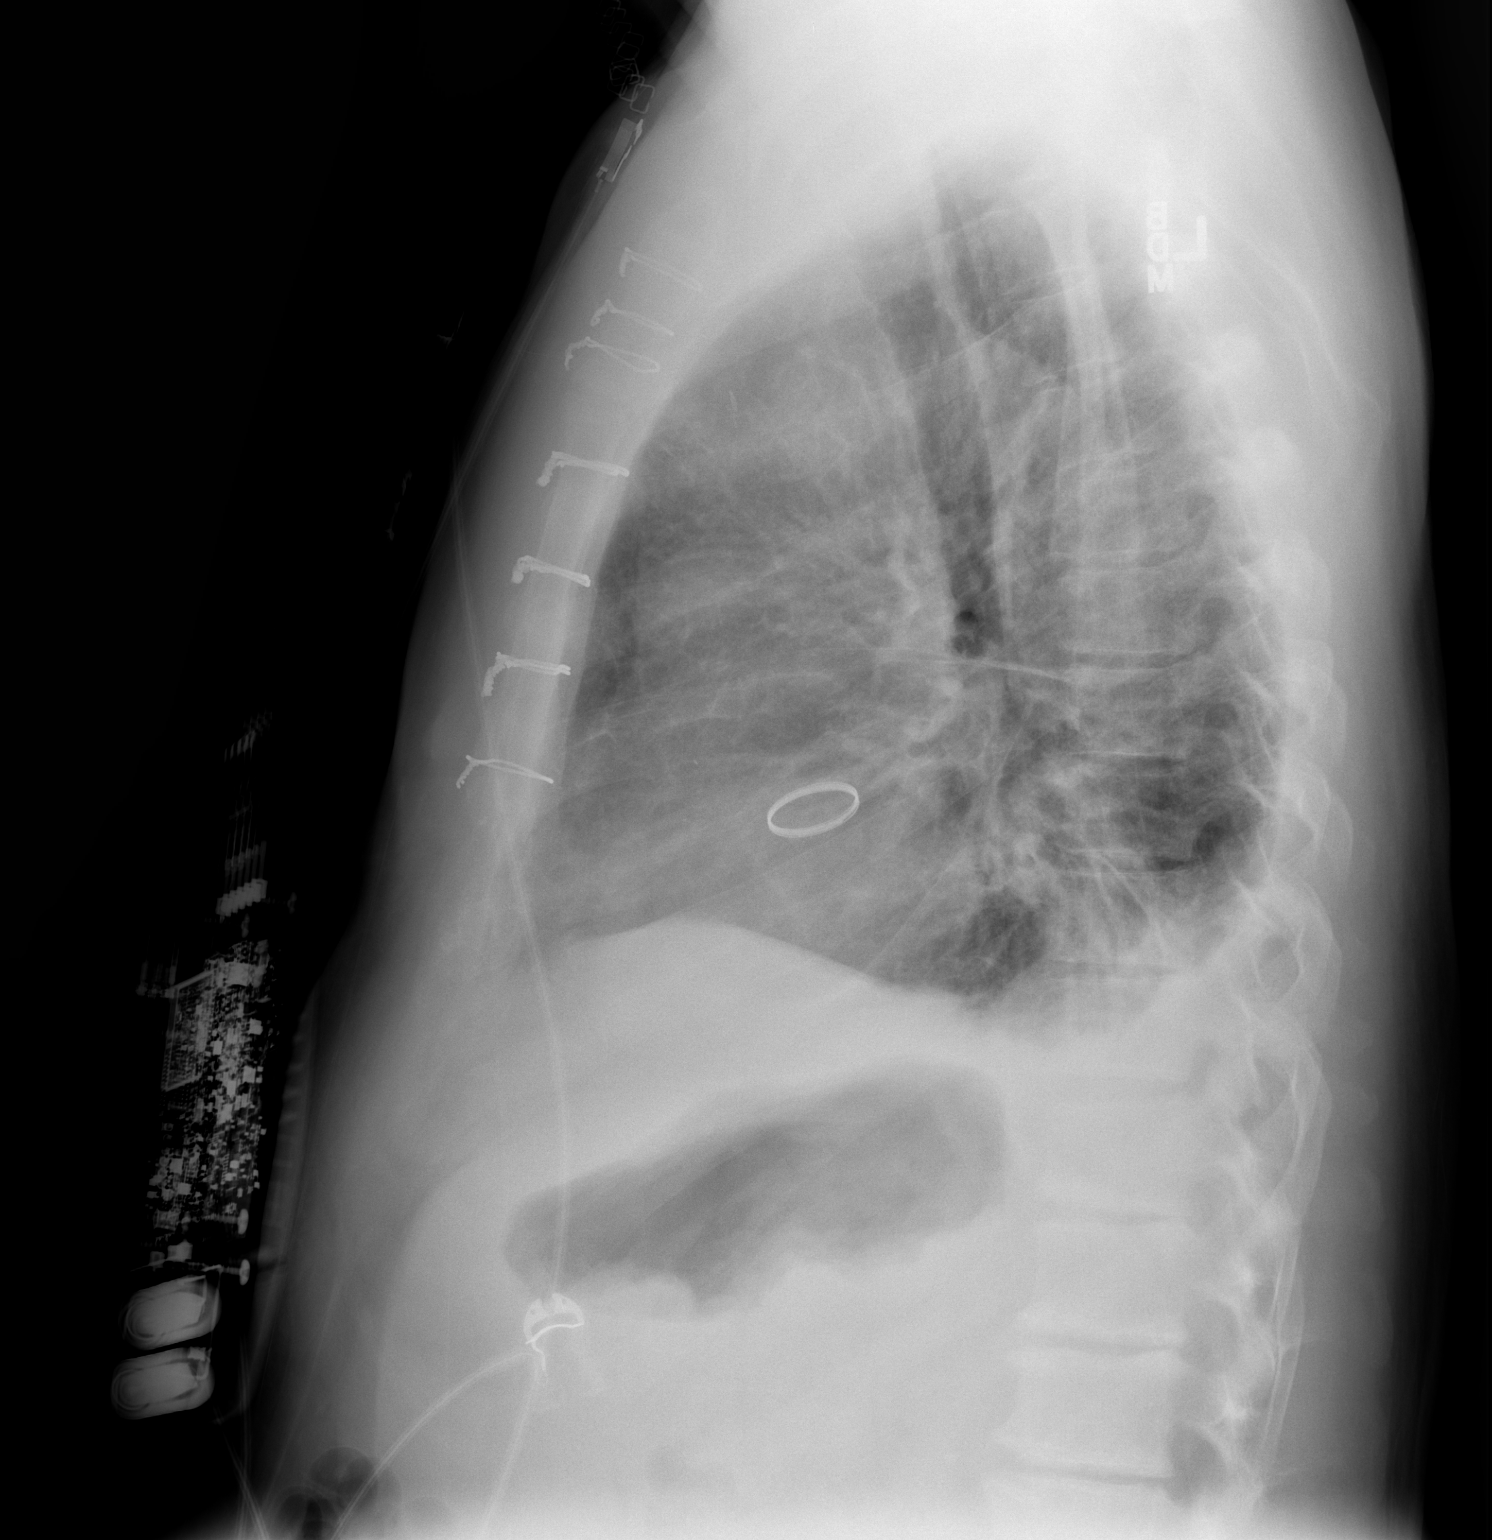

[2 of 2 positions shown; findings below may reference images not displayed]

FINDINGS: Enlargement of cardiac silhouette post median sternotomy and AVR.

Pulmonary vascular congestion.

Mediastinal contour stable.

Probable mild perihilar edema, slightly increased at RIGHT base.

Small bibasilar effusions and atelectasis.

No pneumothorax.

Skin clips projecting over RIGHT infraclavicular region.

No acute osseous findings.
IMPRESSION: Enlargement of cardiac silhouette with pulmonary vascular congestion
and probable mild edema post AVR.

Bibasilar atelectasis and small pleural effusions.

## 2016-06-10 ENCOUNTER — Encounter: Payer: Self-pay | Admitting: Cardiology

## 2016-06-14 ENCOUNTER — Other Ambulatory Visit: Payer: Self-pay | Admitting: Cardiology

## 2016-06-17 ENCOUNTER — Other Ambulatory Visit: Payer: Self-pay | Admitting: Family Medicine

## 2016-06-19 NOTE — Progress Notes (Signed)
[name] Date of Birth: [birth year] Medical Record #[id number]  History of Present Illness: [name] is seen for followup s/p AVR. He is  status post aortic valve replacement with a #23 mm St. Jude prosthesis in 1988. 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 He has been on chronic anticoagulation with Coumadin.  In addition to his valvular disease he also has a history of hyperlipidemia and hypertension. In 2015 he was diagnosed with an ascending thoracic aneurysm. He underwent redo AVR and aortic root replacement with a #23 St. Jude AV and 25 mm Hemashield conduit by Dr. Donata Clay. His post op course was complicated by Afib and sternal wound infection. He has no recurrence of Afib. His sternum healed without complication.  On follow up today he is doing very well.  No chest pain or SOB. Denies palpitations. No edema. He is working full time as a Chartered certified accountant. He has gained 15 lbs.  Current Outpatient Prescriptions on File Prior to Visit  Medication Sig Dispense Refill  . albuterol (PROVENTIL HFA;VENTOLIN HFA) 108 (90 BASE) MCG/ACT inhaler Inhale 2 puffs into the lungs every 6 (six) hours as needed for wheezing or shortness of breath.    . ALPRAZolam (XANAX) 0.5 MG tablet Take 0.5 mg by mouth daily as needed for anxiety.     . diphenhydramine-acetaminophen (TYLENOL PM) 25-500 MG TABS Take 1 tablet by mouth at bedtime as needed (sleep).    . polyethylene glycol (MIRALAX / GLYCOLAX) packet Take 17 g by mouth 2 (two) times daily. 24 each 0  . warfarin (COUMADIN) 5 MG tablet Take 1 tablet (5 mg total) by mouth one time only at 6 PM. 90 tablet 3   No current facility-administered medications on file prior to visit.     No Known Allergies  Past Medical History:  Diagnosis Date  . Anxiety   . Ascending aortic aneurysm (HCC) 01/07/2014  . Asthma   . Chest pain, atypical   . Chronic anticoagulation   . Depression   . Hyperlipidemia   . Hypertension   . S/P  AVR    for endocarditis    Past Surgical History:  Procedure Laterality Date  . AORTIC VALVE REPLACEMENT  1988   23mm St. Jude valve  . AORTIC VALVE REPLACEMENT N/A 01/23/2014   Procedure: REDO AORTIC VALVE REPLACEMENT (AVR) with 23 St. Jude Mechanical Aortic Valved Graft;  Surgeon: Kerin Perna, MD;  Location: [facility] OR;  Service: Open Heart Surgery;  Laterality: N/A;  . APPLICATION OF WOUND VAC N/A 02/22/2014   Procedure: APPLICATION OF WOUND VAC;  Surgeon: Kerin Perna, MD;  Location: [facility] OR;  Service: Vascular;  Laterality: N/A;  . BENTALL PROCEDURE N/A 01/23/2014   Procedure: BENTALL PROCEDURE;  Surgeon: Kerin Perna, MD;  Location: [facility] OR;  Service: Open Heart Surgery;  Laterality: N/A;  . CARDIAC CATHETERIZATION    . DOPPLER ECHOCARDIOGRAPHY  06/12/1996   EF 55-60%  . HAND SURGERY    . I&D EXTREMITY N/A 02/22/2014   Procedure: IRRIGATION AND DEBRIDEMENT EXTREMITY-CHEST;  Surgeon: Kerin Perna, MD;  Location: [facility] OR;  Service: Vascular;  Laterality: N/A;  PULSE LAVAGE  . INTRAOPERATIVE TRANSESOPHAGEAL ECHOCARDIOGRAM N/A 01/23/2014   Procedure: INTRAOPERATIVE TRANSESOPHAGEAL ECHOCARDIOGRAM;  Surgeon: Kerin Perna, MD;  Location: [facility] OR;  Service: Open Heart Surgery;  Laterality: N/A;  . LEFT HEART CATHETERIZATION WITH CORONARY ANGIOGRAM N/A 01/07/2014   Procedure: LEFT HEART CATHETERIZATION WITH CORONARY ANGIOGRAM;  Surgeon: Lennette Bihari, MD;  Location: [facility] CATH LAB;  Service: Cardiovascular;  Laterality: N/A;    History  Smoking Status  . Former Smoker  . Quit date: 05/04/1986  Smokeless Tobacco  . Never Used    History  Alcohol Use  . 1.8 oz/week  . 3 Shots of liquor per week    Comment: daily    Family History  Problem Relation Age of Onset  . Cancer Mother   . Healthy Brother   . Healthy Brother     Review of Systems: As noted in HPI.  All other systems were reviewed and are negative.  Physical Exam: BP 126/68   Pulse 68   Ht 5\' 10"  (1.778 m)   Wt 208  lb (94.3 kg)   BMI 29.84 kg/m  He is a pleasant white male in no acute distress. HEENT exam is unremarkable.  Neck is supple without JVD, adenopathy, thyromegaly, or bruits. Carotid upstrokes are normal. Lungs are clear. Cardiac exam reveals a regular rate and rhythm with a good mechanical aortic valve click. There are no murmurs or rubs. Chest reveals his sternal incision has healed.  Abdomen is soft and nontender without masses or bruits. Extremities are without edema. Equal pulses are 2+ and symmetric. Skin is warm and dry. She is alert and oriented x3. Cranial nerves II through XII are intact.  LABORATORY DATA:   Lab Results  Component Value Date   WBC 12.1 (H) 03/03/2014   HGB 10.5 (L) 03/03/2014   HCT 33.0 (L) 03/03/2014   PLT 322 03/03/2014   GLUCOSE 108 (H) 09/11/2015   CHOL 94 (L) 09/11/2015   TRIG 53 09/11/2015   HDL 38 (L) 09/11/2015   LDLDIRECT 181.4 11/16/2012   LDLCALC 45 09/11/2015   ALT 14 09/11/2015   AST 17 09/11/2015   NA 139 09/11/2015   K 4.1 09/11/2015   CL 105 09/11/2015   CREATININE 0.95 09/11/2015   BUN 22 09/11/2015   CO2 26 09/11/2015   INR 3.4 05/13/2016   HGBA1C 6.1 (H) 01/20/2014    Echo: 02/09/15:Study Conclusions  - Left ventricle: The cavity size was normal. There was mild concentric hypertrophy. Systolic function was normal. The estimated ejection fraction was in the range of 60% to 65%. Wall motion was normal; there were no regional wall motion abnormalities. Left ventricular diastolic function parameters were normal. - Aortic valve: A mechanical prosthesis was present. Transvalvular velocity was within the normal range. There was no stenosis. There was mild regurgitation. Peak velocity (S): 256 cm/s. Slightly increased from 11/2013 at 2.1 cm/s. Mean gradient (S): 17 mm Hg. VTI ratio of LVOT to aortic valve: 0.33. Valve area (VTI): 1.04 cm^2. Valve area (Vmax): 0.86 cm^2. Valve area (Vmean): 0.78 cm^2. - Mitral  valve: There was trivial regurgitation. - Right ventricle: The cavity size was normal. Wall thickness was normal. Systolic function was normal. - Tricuspid valve: There was trivial regurgitation. - Pulmonary arteries: PA peak pressure: 18 mm Hg (S). - Inferior vena cava: The vessel was normal in size. The respirophasic diameter changes were in the normal range (= 50%), consistent with normal central venous pressure.   Assessment / Plan: 1. S/p redo mechanical AVR with Aortic root grafting. He has made a good recovery. Valve sound is normal on exam. Follow up Echo in October 2016 showed normal valve fucntion. Continue coumadin.  Needs SBE prophylaxis.   2. HTN- controlled.  3. Hyperlipidemia - well controlled on lipitor. Will arrange fasting lab work in 3 months. Needs to focus on  weight loss.  Baylea Milburn SwazilandJordan MD, [facility] CenterFACC

## 2016-06-20 ENCOUNTER — Encounter: Payer: Self-pay | Admitting: Cardiology

## 2016-06-20 ENCOUNTER — Ambulatory Visit (INDEPENDENT_AMBULATORY_CARE_PROVIDER_SITE_OTHER): Payer: 59 | Admitting: Cardiology

## 2016-06-20 VITALS — BP 126/68 | HR 68 | Ht 70.0 in | Wt 208.0 lb

## 2016-06-20 DIAGNOSIS — Z952 Presence of prosthetic heart valve: Secondary | ICD-10-CM | POA: Diagnosis not present

## 2016-06-20 DIAGNOSIS — E782 Mixed hyperlipidemia: Secondary | ICD-10-CM | POA: Diagnosis not present

## 2016-06-20 DIAGNOSIS — I10 Essential (primary) hypertension: Secondary | ICD-10-CM | POA: Diagnosis not present

## 2016-06-20 DIAGNOSIS — I359 Nonrheumatic aortic valve disorder, unspecified: Secondary | ICD-10-CM

## 2016-06-20 MED ORDER — ATORVASTATIN CALCIUM 80 MG PO TABS: 80.0000 mg | ORAL_TABLET | Freq: Every day | ORAL | 3 refills | Status: DC

## 2016-06-20 MED ORDER — METOPROLOL TARTRATE 25 MG PO TABS
12.5000 mg | ORAL_TABLET | Freq: Two times a day (BID) | ORAL | 3 refills | Status: DC
Start: 2016-06-20 — End: 2017-07-28

## 2016-06-20 NOTE — Patient Instructions (Addendum)
Continue your current therapy  Focus on weight loss.  I will see you in one  Year  We will arrange fasting lab in 3 months.

## 2016-06-23 IMAGING — CR DG CHEST 2V
2 series · 2 of 2 positions shown · non-contrast
Comparison: 02/10/2014.

CLINICAL DATA: Right-sided chest pain.

EXAM:
CHEST  2 VIEW

[w chest pa]
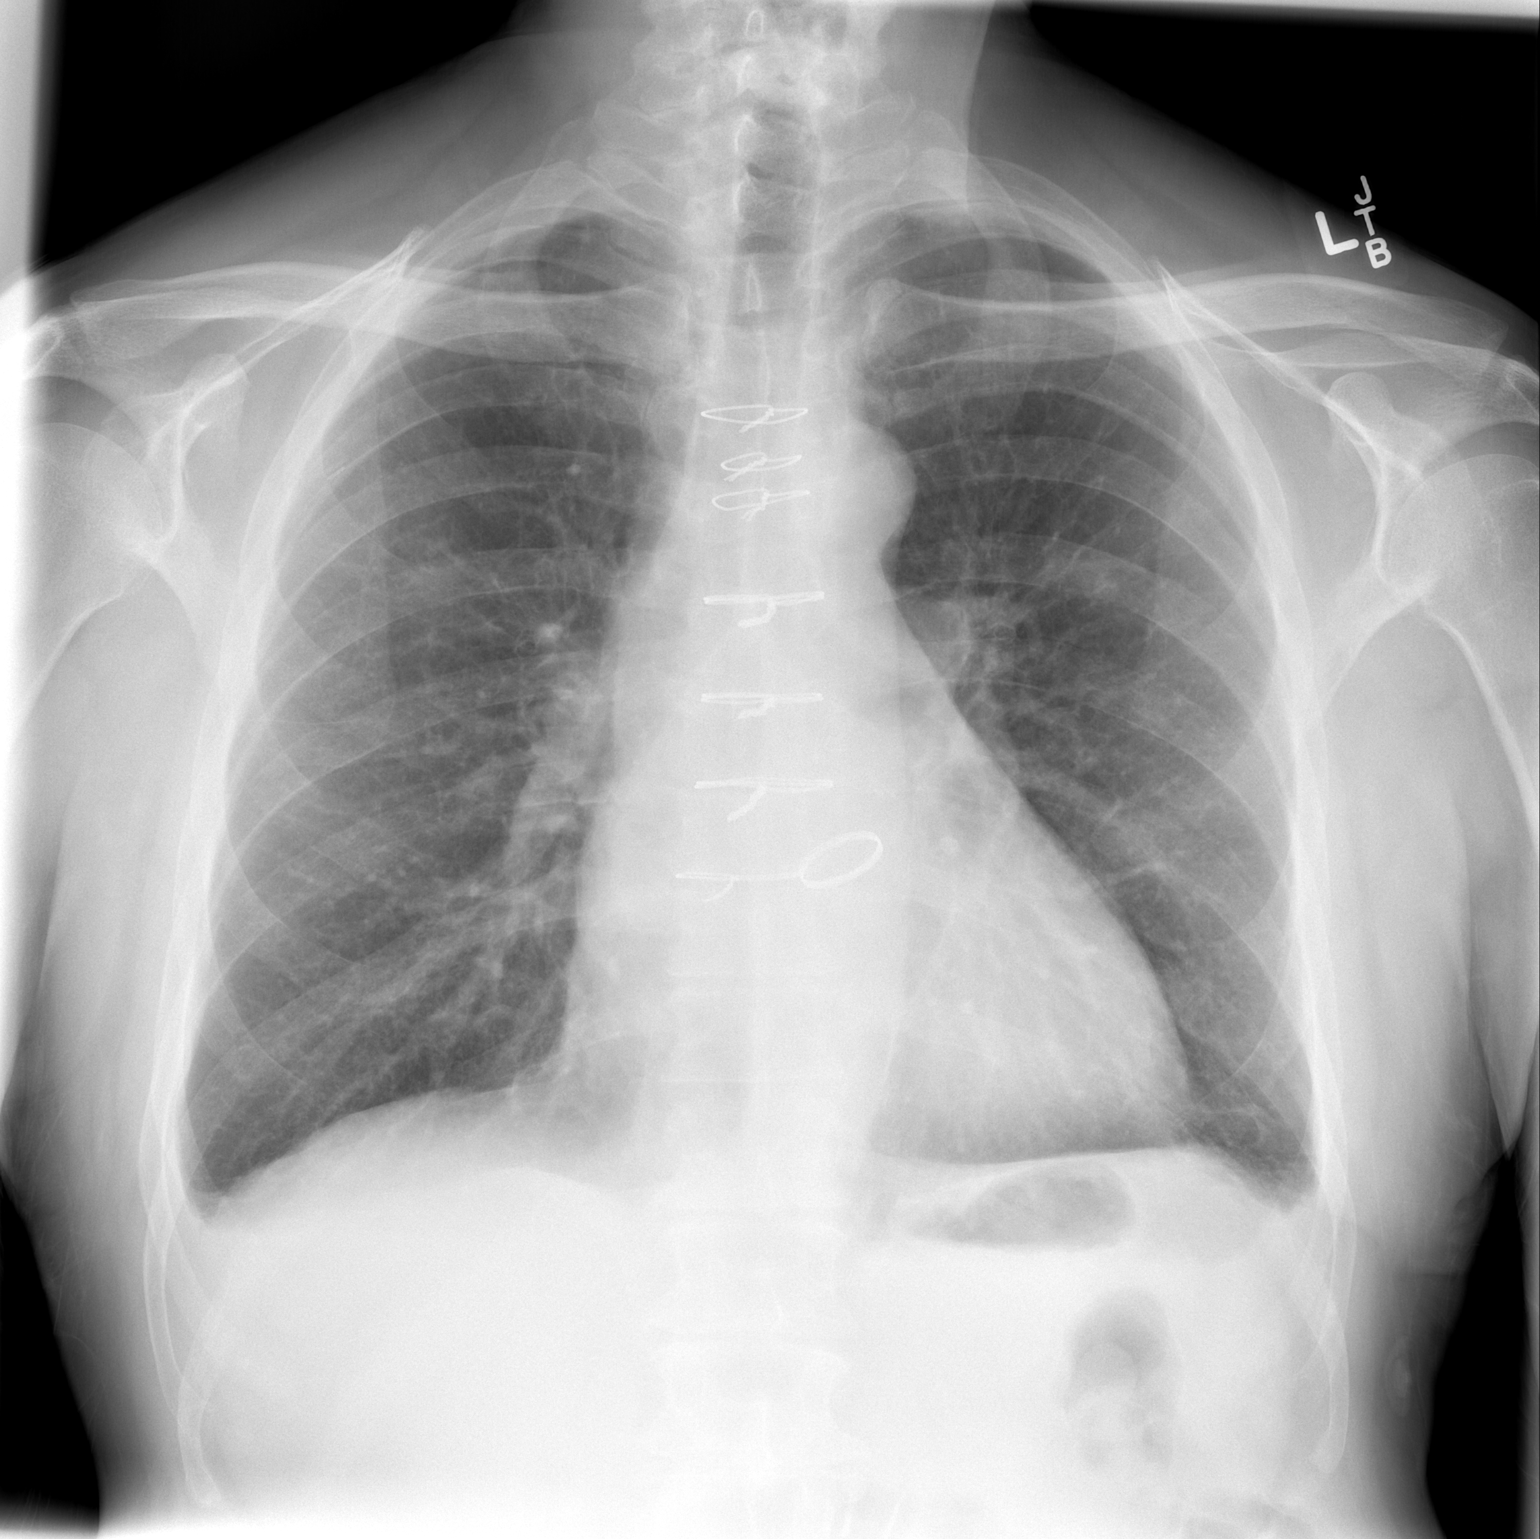

[w chest lat]
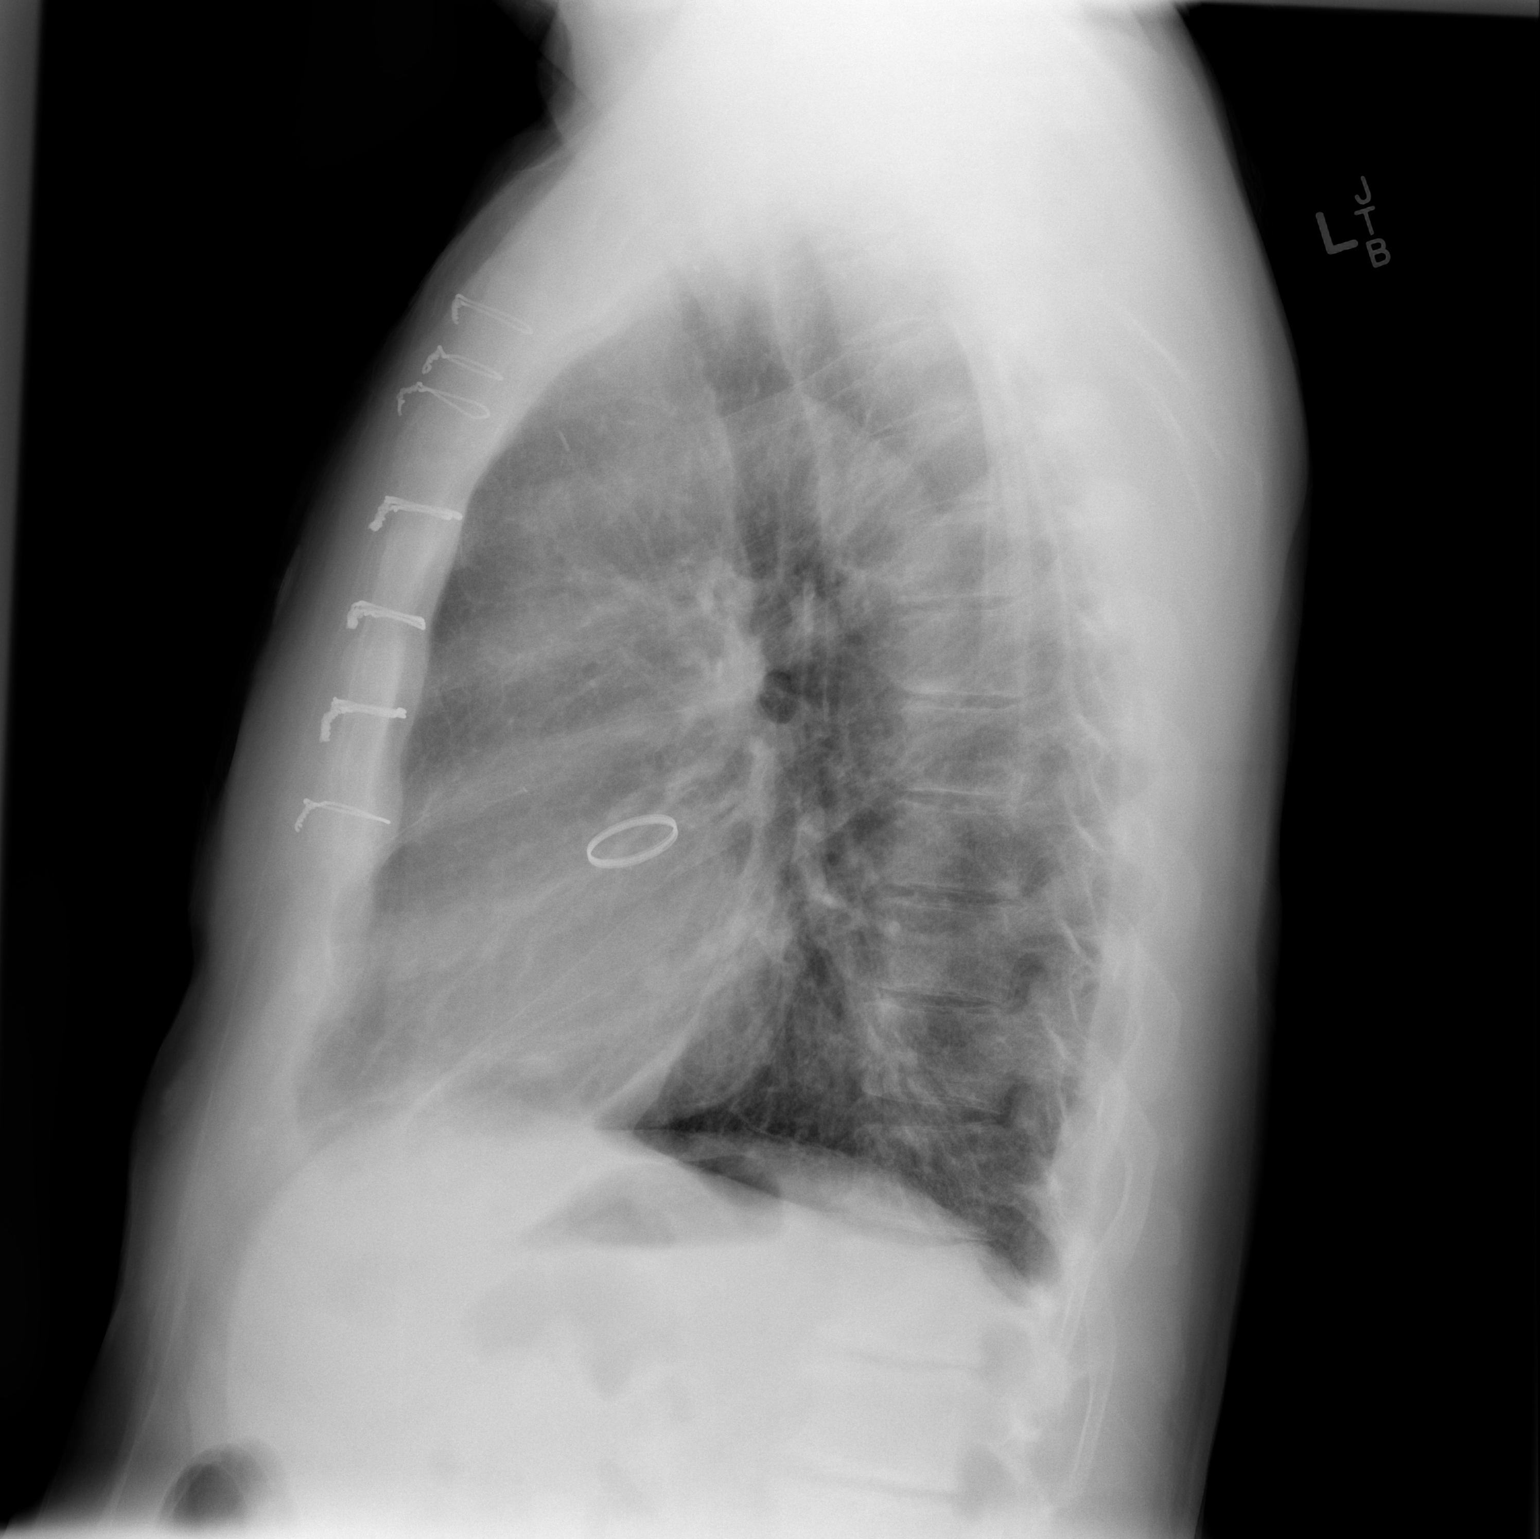

[2 of 2 positions shown; findings below may reference images not displayed]

FINDINGS: Mediastinum and hilar structures are normal. Prior cardiac valve
replacement. Mild cardiomegaly with normal pulmonary vascularity.
Minimal patchy infiltrates in the upper lobes cannot be excluded.
Follow-up chest x-ray recommended to demonstrate clearing. Small
bilateral pleural effusions. No pneumothorax. No acute bony
abnormality.
IMPRESSION: 1. Prior cardiac valve replacement. There is cardiomegaly. No
evidence of pulmonary venous congestion.
2. Minimal patchy upper lobe pulmonary infiltrates, pneumonia cannot
be excluded.
3. Tiny bilateral pleural effusions.

## 2016-06-24 ENCOUNTER — Ambulatory Visit (INDEPENDENT_AMBULATORY_CARE_PROVIDER_SITE_OTHER): Payer: 59 | Admitting: *Deleted

## 2016-06-24 DIAGNOSIS — I480 Paroxysmal atrial fibrillation: Secondary | ICD-10-CM

## 2016-06-24 DIAGNOSIS — I359 Nonrheumatic aortic valve disorder, unspecified: Secondary | ICD-10-CM | POA: Diagnosis not present

## 2016-06-24 DIAGNOSIS — Z952 Presence of prosthetic heart valve: Secondary | ICD-10-CM

## 2016-06-24 LAB — POCT INR: INR: 2.9

## 2016-07-01 ENCOUNTER — Other Ambulatory Visit: Payer: Self-pay | Admitting: Cardiology

## 2016-07-01 ENCOUNTER — Telehealth: Payer: Self-pay | Admitting: Cardiology

## 2016-07-01 NOTE — Telephone Encounter (Signed)
Returned call to patient no answer.LMTC. 

## 2016-07-01 NOTE — Telephone Encounter (Signed)
Patient is having a "thing cut off his arm"  Dr. Clovis RileyMitchell needs him to come off coumadin for 5 days He advised patient to call us about this and find out if will he need lovenox bridging? The procedure is 3/26 - they want him to stop coumadin on 3/20  Patient said to leave a message on his VM w/info  Routed to Dr. SwazilandJordan & Cheryl, LPN

## 2016-07-01 NOTE — Telephone Encounter (Signed)
New message    Pt is calling because his primary doctor, Dr. Clovis RileyMitchell cut a place off his arm and wants him to be off coumadin for 5 days. He wants to talk to [place] Place[place] about this.

## 2016-07-01 NOTE — Telephone Encounter (Signed)
[id number] 

## 2016-07-01 NOTE — Telephone Encounter (Signed)
New message: ° ° ° °Pt returning phone call °

## 2016-07-01 NOTE — Telephone Encounter (Signed)
He will need bridging anticoagulation. Please refer to coumadin clinic to manage.  Canaan Prue SwazilandJordan MD, [facility] HospitalFACC

## 2016-07-05 NOTE — Telephone Encounter (Signed)
Returned call to patient Dr.Jordan's recommendations given.Advised I will send message to coumadin clinic.

## 2016-07-05 NOTE — Telephone Encounter (Signed)
Called patient no answer.LMTC. 

## 2016-07-08 ENCOUNTER — Telehealth: Payer: Self-pay | Admitting: Cardiology

## 2016-07-08 NOTE — Telephone Encounter (Signed)
New message   Pt is calling to ask about the lovenox. He says he does not understand.

## 2016-07-08 NOTE — Telephone Encounter (Signed)
[facility] why he needs a Lovenox bridge now when he did not need one 2 years ago when he held his Coumadin for 7 days. I explained that per our protocol, all patients with a mechanical valve and afib receive Lovenox bridging due to higher risk of clotting when off Coumadin. I told him that I am unsure why he did not receive Lovenox 2 years ago when he stopped his Coumadin for a week. He will come in Monday for Lovenox bridging instructions.

## 2016-07-08 NOTE — Telephone Encounter (Signed)
lovenox question routed to pharmacy staff for assistance

## 2016-07-11 ENCOUNTER — Ambulatory Visit (INDEPENDENT_AMBULATORY_CARE_PROVIDER_SITE_OTHER): Payer: 59 | Admitting: *Deleted

## 2016-07-11 DIAGNOSIS — Z952 Presence of prosthetic heart valve: Secondary | ICD-10-CM

## 2016-07-11 DIAGNOSIS — I359 Nonrheumatic aortic valve disorder, unspecified: Secondary | ICD-10-CM

## 2016-07-11 DIAGNOSIS — I480 Paroxysmal atrial fibrillation: Secondary | ICD-10-CM | POA: Diagnosis not present

## 2016-07-11 LAB — POCT INR: INR: 3.2

## 2016-07-11 MED ORDER — ENOXAPARIN SODIUM 100 MG/ML ~~LOC~~ SOLN: 100.0000 mg | Freq: Two times a day (BID) | SUBCUTANEOUS | 1 refills | Status: DC

## 2016-07-11 NOTE — Patient Instructions (Signed)
07/12/16- Last dose of Coumadin.  07/13/16- No Coumadin or Lovenox.  07/14/16- Inject Lovenox 100mg  in the fatty abdominal tissue at least 2 inches from the belly button twice a day about 12 hours apart, 4am and 4pm rotate sites. No Coumadin.  07/15/16- Inject Lovenox in the fatty tissue every 12 hours, 4am and 4pm. No Coumadin.  07/16/16- Inject Lovenox in the fatty tissue every 12 hours, 4am and 4pm. No Coumadin.  07/17/16- Inject Lovenox in the fatty tissue in the morning at 4 am (No PM dose). No Coumadin.  07/18/16- Procedure Day - No Lovenox - Resume Coumadin in the evening or as directed by doctor (take an extra half tablet with usual dose for 2 days then resume normal dose).  07/19/16- Resume Lovenox inject in the fatty tissue every 12 hours and take Coumadin.  07/20/16- Inject Lovenox in the fatty tissue every 12 hours and take Coumadin.  07/21/16- Inject Lovenox in the fatty tissue every 12 hours and take Coumadin.  07/22/16-Inject Lovenox in the fatty tissue every 12 hours and take Coumadin. Coumadin Appt.

## 2016-07-18 ENCOUNTER — Other Ambulatory Visit: Payer: Self-pay | Admitting: Family Medicine

## 2016-07-22 ENCOUNTER — Ambulatory Visit (INDEPENDENT_AMBULATORY_CARE_PROVIDER_SITE_OTHER): Payer: 59 | Admitting: *Deleted

## 2016-07-22 DIAGNOSIS — I359 Nonrheumatic aortic valve disorder, unspecified: Secondary | ICD-10-CM | POA: Diagnosis not present

## 2016-07-22 DIAGNOSIS — I480 Paroxysmal atrial fibrillation: Secondary | ICD-10-CM

## 2016-07-22 DIAGNOSIS — Z952 Presence of prosthetic heart valve: Secondary | ICD-10-CM | POA: Diagnosis not present

## 2016-07-22 LAB — POCT INR: INR: 1.8

## 2016-07-28 ENCOUNTER — Ambulatory Visit (INDEPENDENT_AMBULATORY_CARE_PROVIDER_SITE_OTHER): Payer: 59 | Admitting: Pharmacist

## 2016-07-28 DIAGNOSIS — I359 Nonrheumatic aortic valve disorder, unspecified: Secondary | ICD-10-CM

## 2016-07-28 DIAGNOSIS — I480 Paroxysmal atrial fibrillation: Secondary | ICD-10-CM | POA: Diagnosis not present

## 2016-07-28 DIAGNOSIS — Z952 Presence of prosthetic heart valve: Secondary | ICD-10-CM | POA: Diagnosis not present

## 2016-07-28 LAB — POCT INR: INR: 3.9

## 2016-08-18 ENCOUNTER — Ambulatory Visit (INDEPENDENT_AMBULATORY_CARE_PROVIDER_SITE_OTHER): Payer: 59 | Admitting: *Deleted

## 2016-08-18 DIAGNOSIS — I480 Paroxysmal atrial fibrillation: Secondary | ICD-10-CM

## 2016-08-18 DIAGNOSIS — I359 Nonrheumatic aortic valve disorder, unspecified: Secondary | ICD-10-CM | POA: Diagnosis not present

## 2016-08-18 DIAGNOSIS — Z952 Presence of prosthetic heart valve: Secondary | ICD-10-CM

## 2016-08-18 LAB — POCT INR: INR: 1.3

## 2016-08-25 ENCOUNTER — Encounter (INDEPENDENT_AMBULATORY_CARE_PROVIDER_SITE_OTHER): Payer: Self-pay

## 2016-08-25 ENCOUNTER — Ambulatory Visit (INDEPENDENT_AMBULATORY_CARE_PROVIDER_SITE_OTHER): Payer: 59

## 2016-08-25 DIAGNOSIS — I480 Paroxysmal atrial fibrillation: Secondary | ICD-10-CM

## 2016-08-25 DIAGNOSIS — I359 Nonrheumatic aortic valve disorder, unspecified: Secondary | ICD-10-CM

## 2016-08-25 DIAGNOSIS — Z952 Presence of prosthetic heart valve: Secondary | ICD-10-CM

## 2016-08-25 LAB — POCT INR: INR: 2.8

## 2016-09-15 ENCOUNTER — Ambulatory Visit (INDEPENDENT_AMBULATORY_CARE_PROVIDER_SITE_OTHER): Payer: 59 | Admitting: *Deleted

## 2016-09-15 DIAGNOSIS — I480 Paroxysmal atrial fibrillation: Secondary | ICD-10-CM | POA: Diagnosis not present

## 2016-09-15 DIAGNOSIS — I359 Nonrheumatic aortic valve disorder, unspecified: Secondary | ICD-10-CM

## 2016-09-15 DIAGNOSIS — Z952 Presence of prosthetic heart valve: Secondary | ICD-10-CM

## 2016-09-15 LAB — POCT INR: INR: 4.8

## 2016-10-05 ENCOUNTER — Ambulatory Visit (INDEPENDENT_AMBULATORY_CARE_PROVIDER_SITE_OTHER): Payer: 59 | Admitting: *Deleted

## 2016-10-05 DIAGNOSIS — I359 Nonrheumatic aortic valve disorder, unspecified: Secondary | ICD-10-CM | POA: Diagnosis not present

## 2016-10-05 DIAGNOSIS — I480 Paroxysmal atrial fibrillation: Secondary | ICD-10-CM | POA: Diagnosis not present

## 2016-10-05 DIAGNOSIS — Z952 Presence of prosthetic heart valve: Secondary | ICD-10-CM | POA: Diagnosis not present

## 2016-10-05 LAB — POCT INR: INR: 3.3

## 2016-11-09 ENCOUNTER — Ambulatory Visit (INDEPENDENT_AMBULATORY_CARE_PROVIDER_SITE_OTHER): Payer: 59 | Admitting: *Deleted

## 2016-11-09 DIAGNOSIS — Z952 Presence of prosthetic heart valve: Secondary | ICD-10-CM | POA: Diagnosis not present

## 2016-11-09 DIAGNOSIS — I480 Paroxysmal atrial fibrillation: Secondary | ICD-10-CM

## 2016-11-09 DIAGNOSIS — I359 Nonrheumatic aortic valve disorder, unspecified: Secondary | ICD-10-CM | POA: Diagnosis not present

## 2016-11-09 LAB — POCT INR: INR: 2.2

## 2016-12-07 ENCOUNTER — Ambulatory Visit (INDEPENDENT_AMBULATORY_CARE_PROVIDER_SITE_OTHER): Payer: 59 | Admitting: *Deleted

## 2016-12-07 DIAGNOSIS — Z952 Presence of prosthetic heart valve: Secondary | ICD-10-CM | POA: Diagnosis not present

## 2016-12-07 DIAGNOSIS — I359 Nonrheumatic aortic valve disorder, unspecified: Secondary | ICD-10-CM

## 2016-12-07 DIAGNOSIS — I480 Paroxysmal atrial fibrillation: Secondary | ICD-10-CM | POA: Diagnosis not present

## 2016-12-07 LAB — POCT INR: INR: 5.2

## 2016-12-21 ENCOUNTER — Ambulatory Visit (INDEPENDENT_AMBULATORY_CARE_PROVIDER_SITE_OTHER): Payer: 59 | Admitting: *Deleted

## 2016-12-21 DIAGNOSIS — Z952 Presence of prosthetic heart valve: Secondary | ICD-10-CM | POA: Diagnosis not present

## 2016-12-21 DIAGNOSIS — I359 Nonrheumatic aortic valve disorder, unspecified: Secondary | ICD-10-CM | POA: Diagnosis not present

## 2016-12-21 DIAGNOSIS — I480 Paroxysmal atrial fibrillation: Secondary | ICD-10-CM

## 2016-12-21 LAB — POCT INR: INR: 3.1

## 2017-01-02 ENCOUNTER — Other Ambulatory Visit: Payer: Self-pay | Admitting: Cardiology

## 2017-01-25 ENCOUNTER — Ambulatory Visit (INDEPENDENT_AMBULATORY_CARE_PROVIDER_SITE_OTHER): Payer: 59 | Admitting: *Deleted

## 2017-01-25 ENCOUNTER — Encounter (INDEPENDENT_AMBULATORY_CARE_PROVIDER_SITE_OTHER): Payer: Self-pay

## 2017-01-25 DIAGNOSIS — Z952 Presence of prosthetic heart valve: Secondary | ICD-10-CM

## 2017-01-25 DIAGNOSIS — I480 Paroxysmal atrial fibrillation: Secondary | ICD-10-CM | POA: Diagnosis not present

## 2017-01-25 DIAGNOSIS — Z5181 Encounter for therapeutic drug level monitoring: Secondary | ICD-10-CM | POA: Diagnosis not present

## 2017-01-25 DIAGNOSIS — I359 Nonrheumatic aortic valve disorder, unspecified: Secondary | ICD-10-CM

## 2017-01-25 LAB — POCT INR: INR: 3.1

## 2017-02-16 ENCOUNTER — Encounter: Payer: Self-pay | Admitting: Cardiology

## 2017-02-16 ENCOUNTER — Ambulatory Visit (INDEPENDENT_AMBULATORY_CARE_PROVIDER_SITE_OTHER): Payer: 59 | Admitting: Cardiology

## 2017-02-16 VITALS — BP 146/82 | HR 66 | Ht 70.0 in | Wt 182.0 lb

## 2017-02-16 DIAGNOSIS — I7121 Aneurysm of the ascending aorta, without rupture: Secondary | ICD-10-CM

## 2017-02-16 DIAGNOSIS — E782 Mixed hyperlipidemia: Secondary | ICD-10-CM | POA: Diagnosis not present

## 2017-02-16 DIAGNOSIS — I10 Essential (primary) hypertension: Secondary | ICD-10-CM | POA: Diagnosis not present

## 2017-02-16 DIAGNOSIS — I712 Thoracic aortic aneurysm, without rupture: Secondary | ICD-10-CM | POA: Diagnosis not present

## 2017-02-16 DIAGNOSIS — Z952 Presence of prosthetic heart valve: Secondary | ICD-10-CM

## 2017-02-16 LAB — HEPATIC FUNCTION PANEL
ALBUMIN: 4.5 g/dL (ref 3.6–4.8)
ALK PHOS: 85 IU/L (ref 39–117)
ALT: 13 IU/L (ref 0–44)
AST: 14 IU/L (ref 0–40)
Bilirubin Total: 0.6 mg/dL (ref 0.0–1.2)
Bilirubin, Direct: 0.14 mg/dL (ref 0.00–0.40)
TOTAL PROTEIN: 6.7 g/dL (ref 6.0–8.5)

## 2017-02-16 LAB — BASIC METABOLIC PANEL
BUN/Creatinine Ratio: 22 (ref 10–24)
BUN: 19 mg/dL (ref 8–27)
CHLORIDE: 103 mmol/L (ref 96–106)
CO2: 25 mmol/L (ref 20–29)
Calcium: 9.6 mg/dL (ref 8.6–10.2)
Creatinine, Ser: 0.88 mg/dL (ref 0.76–1.27)
GFR calc non Af Amer: 93 mL/min/{1.73_m2} (ref >59)
GFR, EST AFRICAN AMERICAN: 108 mL/min/{1.73_m2} (ref >59)
GLUCOSE: 104 mg/dL — AB (ref 65–99)
POTASSIUM: 4.1 mmol/L (ref 3.5–5.2)
SODIUM: 141 mmol/L (ref 134–144)

## 2017-02-16 LAB — LIPID PANEL W/O CHOL/HDL RATIO
Cholesterol, Total: 151 mg/dL (ref 100–199)
HDL: 52 mg/dL (ref >39)
LDL Calculated: 83 mg/dL (ref 0–99)
Triglycerides: 79 mg/dL (ref 0–149)
VLDL Cholesterol Cal: 16 mg/dL (ref 5–40)

## 2017-02-16 LAB — CBC WITH DIFFERENTIAL/PLATELET
BASOS ABS: 0 10*3/uL (ref 0.0–0.2)
Basos: 0 %
EOS (ABSOLUTE): 0.1 10*3/uL (ref 0.0–0.4)
Eos: 2 %
Hematocrit: 44.6 % (ref 37.5–51.0)
Hemoglobin: 15.3 g/dL (ref 13.0–17.7)
IMMATURE GRANS (ABS): 0 10*3/uL (ref 0.0–0.1)
Immature Granulocytes: 0 %
Lymphocytes Absolute: 1.4 10*3/uL (ref 0.7–3.1)
Lymphs: 20 %
MCH: 27.6 pg (ref 26.6–33.0)
MCHC: 34.3 g/dL (ref 31.5–35.7)
MCV: 80 fL (ref 79–97)
MONOS ABS: 0.8 10*3/uL (ref 0.1–0.9)
Monocytes: 11 %
NEUTROS ABS: 4.6 10*3/uL (ref 1.4–7.0)
Neutrophils: 67 %
PLATELETS: 166 10*3/uL (ref 150–379)
RBC: 5.55 x10E6/uL (ref 4.14–5.80)
RDW: 15.3 % (ref 12.3–15.4)
WBC: 6.9 10*3/uL (ref 3.4–10.8)

## 2017-02-16 NOTE — Patient Instructions (Signed)
We will check blood work today  Continue your current therapy

## 2017-02-16 NOTE — Progress Notes (Signed)
[name] [name] Date of Birth: May 14, 1955 Medical Record #[id number]  History of Present Illness: [name] [name] is seen for followup s/p AVR. He is  status post aortic valve replacement with a #23 mm St. Jude prosthesis in 1988. 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 He has been on chronic anticoagulation with Coumadin.  In addition to his valvular disease he also has a history of hyperlipidemia and hypertension. In 2015 he was diagnosed with an ascending thoracic aneurysm. He underwent redo AVR and aortic root replacement with a #23 St. Jude AV and 25 mm Hemashield conduit by Dr. Donata ClayVan Trigt. His post op course was complicated by Afib and sternal wound infection. He has no recurrence of Afib. His sternum healed without complication.  On follow up today he is doing  Well from a cardiac standpoint. He does note his valve click is much louder than his old one.  No chest pain. He has had a recent chest cold with cough and some SOB. Better on steroid inhaler. Denies palpitations. No edema. He is working full time as a Chartered certified accountantmachinist. He has lost 26 lbs with change in his diet.  Current Outpatient Prescriptions on File Prior to Visit  Medication Sig Dispense Refill  . ALPRAZolam (XANAX) 0.5 MG tablet Take 0.5 mg by mouth daily as needed for anxiety.     Marland Kitchen. atorvastatin (LIPITOR) 80 MG tablet Take 1 tablet (80 mg total) by mouth daily. 90 tablet 3  . diphenhydramine-acetaminophen (TYLENOL PM) 25-500 MG TABS Take 1 tablet by mouth at bedtime as needed (sleep).    . enoxaparin (LOVENOX) 100 MG/ML injection Inject 1 mL (100 mg total) into the skin every 12 (twelve) hours. 20 Syringe 1  . metoprolol tartrate (LOPRESSOR) 25 MG tablet Take 0.5 tablets (12.5 mg total) by mouth 2 (two) times daily. 90 tablet 3  . polyethylene glycol (MIRALAX / GLYCOLAX) packet Take 17 g by mouth 2 (two) times daily. 24 each 0  . warfarin (COUMADIN) 5 MG tablet Take 1 to 1 and 1/2 tablets daily or as  directed by coumadin clinic. 100 tablet 0   No current facility-administered medications on file prior to visit.     No Known Allergies  Past Medical History:  Diagnosis Date  . Anxiety   . Ascending aortic aneurysm (HCC) 01/07/2014  . Asthma   . Chest pain, atypical   . Chronic anticoagulation   . Depression   . Hyperlipidemia   . Hypertension   . S/P AVR    for endocarditis    Past Surgical History:  Procedure Laterality Date  . AORTIC VALVE REPLACEMENT  1988   23mm St. Jude valve  . AORTIC VALVE REPLACEMENT N/A 01/23/2014   Procedure: REDO AORTIC VALVE REPLACEMENT (AVR) with 23 St. Jude Mechanical Aortic Valved Graft;  Surgeon: Kerin PernaPeter Van Trigt, MD;  [facility];  Service: Open Heart Surgery;  Laterality: N/A;  . APPLICATION OF WOUND VAC N/A 02/22/2014   Procedure: APPLICATION OF WOUND VAC;  Surgeon: Kerin PernaPeter Van Trigt, MD;  [facility];  Service: Vascular;  Laterality: N/A;  . BENTALL PROCEDURE N/A 01/23/2014   Procedure: BENTALL PROCEDURE;  Surgeon: Kerin PernaPeter Van Trigt, MD;  [facility];  Service: Open Heart Surgery;  Laterality: N/A;  . CARDIAC CATHETERIZATION    . DOPPLER ECHOCARDIOGRAPHY  06/12/1996   EF 55-60%  . HAND SURGERY    . I&D EXTREMITY N/A 02/22/2014   Procedure: IRRIGATION AND DEBRIDEMENT EXTREMITY-CHEST;  Surgeon: Kathlee NationsPeter Van Trigt,  MD;  Location: [facility] OR;  Service: Vascular;  Laterality: N/A;  PULSE LAVAGE  . INTRAOPERATIVE TRANSESOPHAGEAL ECHOCARDIOGRAM N/A 01/23/2014   Procedure: INTRAOPERATIVE TRANSESOPHAGEAL ECHOCARDIOGRAM;  Surgeon: Kerin Perna, MD;  Location[facility] OR;  Service: Open Heart Surgery;  Laterality: N/A;  . LEFT HEART CATHETERIZATION WITH CORONARY ANGIOGRAM N/A 01/07/2014   Procedure: LEFT HEART CATHETERIZATION WITH CORONARY ANGIOGRAM;  Surgeon: Lennette Bihari, MD;  Location: [facility] CATH LAB;  Service: Cardiovascular;  Laterality: N/A;    History  Smoking Status  . Former Smoker  . Quit date: 05/04/1986  Smokeless Tobacco  . Never Used     History  Alcohol Use  . 1.8 oz/week  . 3 Shots of liquor per week    Comment: daily    Family History  Problem Relation Age of Onset  . Cancer Mother   . Healthy Brother   . Healthy Brother     Review of Systems: As noted in HPI.  All other systems were reviewed and are negative.  Physical Exam: BP (!) 146/82   Pulse 66   Ht 5\' 10"  (1.778 m)   Wt 182 lb (82.6 kg)   BMI 26.11 kg/m  GENERAL:  Well appearing WM in NAD HEENT:  PERRL, EOMI, sclera are clear. Oropharynx is clear. NECK:  No jugular venous distention, carotid upstroke brisk and symmetric, no bruits, no thyromegaly or adenopathy LUNGS:  Clear to auscultation bilaterally CHEST:  Unremarkable HEART:  RRR,  PMI not displaced or sustained, loud mechanical S2. No S3, no S4: no clicks, no rubs, no murmurs ABD:  Soft, nontender. BS +, no masses or bruits. No hepatomegaly, no splenomegaly EXT:  2 + pulses throughout, no edema, no cyanosis no clubbing SKIN:  Warm and dry.  No rashes NEURO:  Alert and oriented x 3. Cranial nerves II through XII intact. PSYCH:  Cognitively intact    LABORATORY DATA:   Lab Results  Component Value Date   WBC 12.1 (H) 03/03/2014   HGB 10.5 (L) 03/03/2014   HCT 33.0 (L) 03/03/2014   PLT 322 03/03/2014   GLUCOSE 108 (H) 09/11/2015   CHOL 94 (L) 09/11/2015   TRIG 53 09/11/2015   HDL 38 (L) 09/11/2015   LDLDIRECT 181.4 11/16/2012   LDLCALC 45 09/11/2015   ALT 14 09/11/2015   AST 17 09/11/2015   NA 139 09/11/2015   K 4.1 09/11/2015   CL 105 09/11/2015   CREATININE 0.95 09/11/2015   BUN 22 09/11/2015   CO2 26 09/11/2015   INR 3.1 01/25/2017   HGBA1C 6.1 (H) 01/20/2014    Echo: 02/09/15:Study Conclusions  - Left ventricle: The cavity size was normal. There was mild concentric hypertrophy. Systolic function was normal. The estimated ejection fraction was in the range of 60% to 65%. Wall motion was normal; there were no regional wall motion abnormalities. Left  ventricular diastolic function parameters were normal. - Aortic valve: A mechanical prosthesis was present. Transvalvular velocity was within the normal range. There was no stenosis. There was mild regurgitation. Peak velocity (S): 256 cm/s. Slightly increased from 11/2013 at 2.1 cm/s. Mean gradient (S): 17 mm Hg. VTI ratio of LVOT to aortic valve: 0.33. Valve area (VTI): 1.04 cm^2. Valve area (Vmax): 0.86 cm^2. Valve area (Vmean): 0.78 cm^2. - Mitral valve: There was trivial regurgitation. - Right ventricle: The cavity size was normal. Wall thickness was normal. Systolic function was normal. - Tricuspid valve: There was trivial regurgitation. - Pulmonary arteries: PA peak pressure: 18 mm Hg (S). -  Inferior vena cava: The vessel was normal in size. The respirophasic diameter changes were in the normal range (= 50%), consistent with normal central venous pressure.   Assessment / Plan: 1. S/p redo mechanical AVR with Aortic root grafting. Valve sound is normal on exam with loud mechanical click. Follow up Echo in October 2016 showed normal valve fucntion. Continue coumadin.  Needs SBE prophylaxis.   2. HTN- controlled.  3. Hyperlipidemia -  on lipitor. Will check fasting lab work today.  Peter Swaziland MD, [facility]

## 2017-02-21 ENCOUNTER — Telehealth: Payer: Self-pay | Admitting: Cardiology

## 2017-02-21 NOTE — Telephone Encounter (Signed)
New Message ° °Pt call requesting to speak with RN about lab results. Please call back to discuss  °

## 2017-02-21 NOTE — Telephone Encounter (Signed)
Patient aware of lab results and verbalized understanding. Copy mailed to patient per request.

## 2017-03-08 ENCOUNTER — Ambulatory Visit (INDEPENDENT_AMBULATORY_CARE_PROVIDER_SITE_OTHER): Payer: 59

## 2017-03-08 DIAGNOSIS — Z952 Presence of prosthetic heart valve: Secondary | ICD-10-CM

## 2017-03-08 DIAGNOSIS — I480 Paroxysmal atrial fibrillation: Secondary | ICD-10-CM | POA: Diagnosis not present

## 2017-03-08 DIAGNOSIS — I359 Nonrheumatic aortic valve disorder, unspecified: Secondary | ICD-10-CM | POA: Diagnosis not present

## 2017-03-08 LAB — POCT INR: INR: 1.9

## 2017-03-08 NOTE — Patient Instructions (Signed)
Take 1.5 tablets today and tomorrow, then resume same dosage 1 tablet daily. Recheck in 4 weeks.  Call with any problems or new medications [phone]

## 2017-04-05 ENCOUNTER — Ambulatory Visit (INDEPENDENT_AMBULATORY_CARE_PROVIDER_SITE_OTHER): Payer: 59 | Admitting: *Deleted

## 2017-04-05 DIAGNOSIS — Z952 Presence of prosthetic heart valve: Secondary | ICD-10-CM

## 2017-04-05 DIAGNOSIS — Z5181 Encounter for therapeutic drug level monitoring: Secondary | ICD-10-CM | POA: Diagnosis not present

## 2017-04-05 DIAGNOSIS — I359 Nonrheumatic aortic valve disorder, unspecified: Secondary | ICD-10-CM | POA: Diagnosis not present

## 2017-04-05 DIAGNOSIS — I480 Paroxysmal atrial fibrillation: Secondary | ICD-10-CM

## 2017-04-05 LAB — POCT INR: INR: 3.2

## 2017-04-05 NOTE — Patient Instructions (Signed)
Continue taking 1 tablet daily.  Recheck in 5 weeks.  Call with any problems or new medications [phone] 

## 2017-04-07 ENCOUNTER — Other Ambulatory Visit: Payer: Self-pay | Admitting: Cardiology

## 2017-04-07 NOTE — Telephone Encounter (Signed)
Please review for refill, thanks ! 

## 2017-05-10 ENCOUNTER — Ambulatory Visit: Payer: 59 | Admitting: Pharmacist

## 2017-05-10 DIAGNOSIS — I480 Paroxysmal atrial fibrillation: Secondary | ICD-10-CM | POA: Diagnosis not present

## 2017-05-10 DIAGNOSIS — Z952 Presence of prosthetic heart valve: Secondary | ICD-10-CM

## 2017-05-10 DIAGNOSIS — I359 Nonrheumatic aortic valve disorder, unspecified: Secondary | ICD-10-CM

## 2017-05-10 LAB — POCT INR: INR: 5.5

## 2017-05-10 NOTE — Patient Instructions (Signed)
Description   No warfarin today or tomorrow then continue taking 1 tablet daily. Recheck in 10 days patient refuses until 2 weeks.  Call with any problems or new medications [phone]

## 2017-05-24 ENCOUNTER — Ambulatory Visit: Payer: 59 | Admitting: *Deleted

## 2017-05-24 DIAGNOSIS — I480 Paroxysmal atrial fibrillation: Secondary | ICD-10-CM

## 2017-05-24 DIAGNOSIS — Z5181 Encounter for therapeutic drug level monitoring: Secondary | ICD-10-CM

## 2017-05-24 DIAGNOSIS — Z952 Presence of prosthetic heart valve: Secondary | ICD-10-CM | POA: Diagnosis not present

## 2017-05-24 DIAGNOSIS — I359 Nonrheumatic aortic valve disorder, unspecified: Secondary | ICD-10-CM | POA: Diagnosis not present

## 2017-05-24 LAB — POCT INR: INR: 2.6

## 2017-05-24 NOTE — Patient Instructions (Signed)
Description   Continue taking 1 tablet daily. Recheck in 3 weeks  patient refuses states will come back in 4 weeks  Call with any problems or new medications [phone]

## 2017-06-21 ENCOUNTER — Ambulatory Visit: Payer: 59 | Admitting: *Deleted

## 2017-06-21 DIAGNOSIS — Z952 Presence of prosthetic heart valve: Secondary | ICD-10-CM

## 2017-06-21 DIAGNOSIS — I359 Nonrheumatic aortic valve disorder, unspecified: Secondary | ICD-10-CM | POA: Diagnosis not present

## 2017-06-21 DIAGNOSIS — I480 Paroxysmal atrial fibrillation: Secondary | ICD-10-CM

## 2017-06-21 LAB — POCT INR: INR: 3.1

## 2017-06-21 NOTE — Patient Instructions (Signed)
Description   Continue taking 1 tablet daily. Recheck in 5 weeks    Call with any problems or new medications [phone]

## 2017-07-26 ENCOUNTER — Ambulatory Visit: Payer: 59 | Admitting: *Deleted

## 2017-07-26 DIAGNOSIS — I359 Nonrheumatic aortic valve disorder, unspecified: Secondary | ICD-10-CM | POA: Diagnosis not present

## 2017-07-26 DIAGNOSIS — I480 Paroxysmal atrial fibrillation: Secondary | ICD-10-CM | POA: Diagnosis not present

## 2017-07-26 DIAGNOSIS — Z952 Presence of prosthetic heart valve: Secondary | ICD-10-CM

## 2017-07-26 LAB — POCT INR: INR: 2.9

## 2017-07-26 NOTE — Patient Instructions (Signed)
Description   Continue taking 1 tablet daily. Recheck in 6 weeks    Call with any problems or new medications [phone]

## 2017-07-28 ENCOUNTER — Other Ambulatory Visit: Payer: Self-pay | Admitting: Cardiology

## 2017-08-10 ENCOUNTER — Other Ambulatory Visit: Payer: Self-pay | Admitting: Cardiology

## 2017-08-10 ENCOUNTER — Other Ambulatory Visit: Payer: Self-pay

## 2017-08-10 MED ORDER — ATORVASTATIN CALCIUM 80 MG PO TABS: ORAL_TABLET | ORAL | 1 refills | Status: DC

## 2017-09-06 ENCOUNTER — Ambulatory Visit: Payer: 59 | Admitting: *Deleted

## 2017-09-06 DIAGNOSIS — I480 Paroxysmal atrial fibrillation: Secondary | ICD-10-CM | POA: Diagnosis not present

## 2017-09-06 DIAGNOSIS — I359 Nonrheumatic aortic valve disorder, unspecified: Secondary | ICD-10-CM

## 2017-09-06 DIAGNOSIS — Z952 Presence of prosthetic heart valve: Secondary | ICD-10-CM

## 2017-09-06 LAB — POCT INR: INR: 4.4

## 2017-09-06 NOTE — Patient Instructions (Signed)
Description   Do not take any Coumadin today then continue taking 1 tablet daily. Recheck in 4 weeks    Call with any problems or new medications [phone]

## 2017-10-04 ENCOUNTER — Ambulatory Visit: Payer: 59 | Admitting: Pharmacist

## 2017-10-04 DIAGNOSIS — I359 Nonrheumatic aortic valve disorder, unspecified: Secondary | ICD-10-CM

## 2017-10-04 DIAGNOSIS — Z952 Presence of prosthetic heart valve: Secondary | ICD-10-CM

## 2017-10-04 DIAGNOSIS — I480 Paroxysmal atrial fibrillation: Secondary | ICD-10-CM

## 2017-10-04 LAB — POCT INR: INR: 2.8 (ref 2.0–3.0)

## 2017-10-24 ENCOUNTER — Other Ambulatory Visit: Payer: Self-pay | Admitting: Cardiology

## 2017-11-08 ENCOUNTER — Ambulatory Visit: Payer: 59 | Admitting: Pharmacist

## 2017-11-08 DIAGNOSIS — I480 Paroxysmal atrial fibrillation: Secondary | ICD-10-CM | POA: Diagnosis not present

## 2017-11-08 DIAGNOSIS — I359 Nonrheumatic aortic valve disorder, unspecified: Secondary | ICD-10-CM

## 2017-11-08 DIAGNOSIS — Z952 Presence of prosthetic heart valve: Secondary | ICD-10-CM | POA: Diagnosis not present

## 2017-11-08 LAB — POCT INR: INR: 3.1 — AB (ref 2.0–3.0)

## 2017-11-08 NOTE — Patient Instructions (Signed)
Description   Continue Coumadin 1 tablet daily. Recheck in 6 weeks. Call with any problems or new medications [phone]     

## 2017-11-15 ENCOUNTER — Other Ambulatory Visit: Payer: Self-pay | Admitting: Cardiology

## 2017-11-15 NOTE — Telephone Encounter (Signed)
Rx request sent to pharmacy.  

## 2017-12-20 ENCOUNTER — Ambulatory Visit: Payer: 59 | Admitting: *Deleted

## 2017-12-20 DIAGNOSIS — I359 Nonrheumatic aortic valve disorder, unspecified: Secondary | ICD-10-CM

## 2017-12-20 DIAGNOSIS — Z952 Presence of prosthetic heart valve: Secondary | ICD-10-CM

## 2017-12-20 DIAGNOSIS — Z5181 Encounter for therapeutic drug level monitoring: Secondary | ICD-10-CM | POA: Diagnosis not present

## 2017-12-20 DIAGNOSIS — I480 Paroxysmal atrial fibrillation: Secondary | ICD-10-CM

## 2017-12-20 LAB — POCT INR: INR: 3 (ref 2.0–3.0)

## 2017-12-20 NOTE — Patient Instructions (Signed)
Description   Continue Coumadin 1 tablet daily. Recheck in 6 weeks. Call with any problems or new medications [phone]     

## 2018-01-03 ENCOUNTER — Other Ambulatory Visit: Payer: Self-pay | Admitting: Cardiology

## 2018-01-16 NOTE — Progress Notes (Signed)
Cardiology Office Note:    Date:  01/17/2018   ID:  ARVIL UTZ, DOB May 26, 1955, MRN [id number]  PCP:  Asencion Gowda.[name] Saucer, MD  Cardiologist:  Peter Swaziland, MD   Referring MD: Asencion Gowda.[name] Saucer, MD   Chief Complaint  Patient presents with  . Chest Pain    History of Present Illness:    [name] [name] is a 62 y.o. 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 No obstructive disease by heart cath 2015. He was last seen in clinic on 02/16/17 with Dr. Swaziland. He was doing well at that time. Of note, he requires SBE prophylaxis.   He returns today for annual follow up.  He has several complaints.  He states that he smoked marijuana as an early gentleman but had stopped in his mid 62s.  6-1/2 years ago he started smoking marijuana again with his girlfriend and experienced lung inflammation and asthma-like symptoms when he smoked. His girlfriend died three years ago, but he continues to smoke. He reports on September 7 he smoked marijuana and took Viagra with his new girlfriend.  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  On September 9, he experienced chest wall pain but also states that he had an upper respiratory cold.  The chest pain was described in his lateral chest walls bilaterally.  The chest wall pain was worse with deep inspiration and certain movements.  Following September 9, he has worked doing laborious tasks at his job and on his farm without chest pain.  He is recovering from his chest cold and taking Mucinex.  He feels much better today, but is concerned about a cardiac etiology. He is also concerned about taking viagara with his cardiac medications.  Lastly, he is concerned about pneumonia; however, he stats his PCP did not think he was suffering from pneumonia.     Past Medical History:  Diagnosis Date  .  Anxiety   . Ascending aortic aneurysm (HCC) 01/07/2014  . Asthma   . Chest pain, atypical   . Chronic anticoagulation   . Depression   . Hyperlipidemia   . Hypertension   . S/P AVR    for endocarditis    Past Surgical History:  Procedure Laterality Date  . AORTIC VALVE REPLACEMENT  1988   23mm St. Jude valve  . AORTIC VALVE REPLACEMENT N/A 01/23/2014   Procedure: REDO AORTIC VALVE REPLACEMENT (AVR) with 23 St. Jude Mechanical Aortic Valved Graft;  Surgeon: Kerin Perna, MD;  Location: [facility] OR;  Service: Open Heart Surgery;  Laterality: N/A;  . APPLICATION OF WOUND VAC N/A 02/22/2014   Procedure: APPLICATION OF WOUND VAC;  Surgeon: Kerin Perna, MD;  Location: [facility] OR;  Service: Vascular;  Laterality: N/A;  . BENTALL PROCEDURE N/A 01/23/2014   Procedure: BENTALL PROCEDURE;  Surgeon: Kerin Perna, MD;  Location: [facility] OR;  Service: Open Heart Surgery;  Laterality: N/A;  . CARDIAC CATHETERIZATION    . DOPPLER ECHOCARDIOGRAPHY  06/12/1996   EF 55-60%  . HAND SURGERY    . I&D EXTREMITY N/A 02/22/2014   Procedure: IRRIGATION AND DEBRIDEMENT EXTREMITY-CHEST;  Surgeon: Kerin Perna, MD;  Location: [facility] OR;  Service: Vascular;  Laterality: N/A;  PULSE LAVAGE  . INTRAOPERATIVE TRANSESOPHAGEAL ECHOCARDIOGRAM N/A 01/23/2014   Procedure: INTRAOPERATIVE TRANSESOPHAGEAL ECHOCARDIOGRAM;  Surgeon: Kerin Perna, MD;  Location: [facility] OR;  Service: Open Heart Surgery;  Laterality: N/A;  . LEFT HEART CATHETERIZATION WITH CORONARY ANGIOGRAM N/A 01/07/2014   Procedure: LEFT HEART CATHETERIZATION WITH CORONARY ANGIOGRAM;  Surgeon: Lennette Bihari, MD;  Location: [facility] CATH LAB;  Service: Cardiovascular;  Laterality: N/A;    Current Medications: Current Meds  Medication Sig  . ALPRAZolam (XANAX) 0.5 MG tablet Take 0.5 mg by mouth daily as needed for anxiety.   Marland Kitchen atorvastatin (LIPITOR) 80 MG tablet TAKE 1 TABLET BY MOUTH ONCE DAILY. MUST HAVE OFFICE VISIT BEFORE FURTHER REFILLS.  Marland Kitchen  diphenhydramine-acetaminophen (TYLENOL PM) 25-500 MG TABS Take 1 tablet by mouth at bedtime as needed (sleep).  . Fluticasone Furoate (ARNUITY ELLIPTA) 200 MCG/ACT AEPB Inhale 1 puff into the lungs as needed.  . metoprolol tartrate (LOPRESSOR) 25 MG tablet Take 0.5 tablets (12.5 mg total) by mouth 2 (two) times daily.  . polyethylene glycol (MIRALAX / GLYCOLAX) packet Take 17 g by mouth 2 (two) times daily.  Marland Kitchen warfarin (COUMADIN) 5 MG tablet TAKE 1-1&1/2  TABLETS BY MOUTH ONCE DAILY OR AS DIRECTED BY COUMADIN CLINIC  . [DISCONTINUED] metoprolol tartrate (LOPRESSOR) 25 MG tablet TAKE 1/2 (ONE-HALF) TABLET BY MOUTH TWICE DAILY     Allergies:   Patient has no known allergies.   Social History   Socioeconomic History  . Marital status: Married    Spouse name: Not on file  . Number of children: Not on file  . Years of education: Not on file  . Highest education level: Not on file  Occupational History  . Occupation: Chiropodist: [employer]  Social Needs  . Financial resource strain: Not on file  . Food insecurity:    Worry: Not on file    Inability: Not on file  . Transportation needs:    Medical: Not on file    Non-medical: Not on file  Tobacco Use  . Smoking status: Former Smoker    Last attempt to quit: 05/04/1986    Years since quitting: 31.7  . Smokeless tobacco: Never Used  Substance and Sexual Activity  . Alcohol use: Yes    Alcohol/week: 3.0 standard drinks    Types: 3 Shots of liquor per week    Comment: daily  . Drug use: Yes    Types: Marijuana  . Sexual activity: Not on file  Lifestyle  . Physical activity:    Days per week: Not on file    Minutes per session: Not on file  . Stress: Not on file  Relationships  . Social connections:    Talks on phone: Not on file    Gets together: Not on file    Attends religious service: Not on file    Active member of club or organization: Not on file    Attends meetings of clubs or organizations: Not on file     Relationship status: Not on file  Other Topics Concern  . Not on file  Social History Narrative  . Not on file     Family History: The patient's family history includes Cancer in his mother; Healthy in his brother and brother.  ROS:   Please see the history of present illness.     All other systems reviewed and are negative.  EKGs/Labs/Other Studies Reviewed:    The following studies were reviewed today:  Echo 02/08/17: Study Conclusions - Left ventricle: The cavity size was normal. There was mild   concentric hypertrophy. Systolic function was normal. The   estimated ejection  fraction was in the range of 60% to 65%. Wall   motion was normal; there were no regional wall motion   abnormalities. Left ventricular diastolic function parameters   were normal. - Aortic valve: A mechanical prosthesis was present. Transvalvular   velocity was within the normal range. There was no stenosis.   There was mild regurgitation. Peak velocity (S): 256 cm/s.   Slightly increased from 11/2013 at 2.1 cm/s. Mean gradient (S): 17   mm Hg. VTI ratio of LVOT to aortic valve: 0.33. Valve area (VTI):   1.04 cm^2. Valve area (Vmax): 0.86 cm^2. Valve area (Vmean): 0.78   cm^2. - Mitral valve: There was trivial regurgitation. - Right ventricle: The cavity size was normal. Wall thickness was   normal. Systolic function was normal. - Tricuspid valve: There was trivial regurgitation. - Pulmonary arteries: PA peak pressure: 18 mm Hg (S). - Inferior vena cava: The vessel was normal in size. The   respirophasic diameter changes were in the normal range (= 50%),   consistent with normal central venous pressure.   EKG:  EKG is ordered today.  The ekg ordered today demonstrates sinus rhythm with first degree heart block  Recent Labs: 02/16/2017: ALT 13; BUN 19; Creatinine, Ser 0.88; Hemoglobin 15.3; Platelets 166; Potassium 4.1; Sodium 141  Recent Lipid Panel    Component Value Date/Time   CHOL 151  02/16/2017 1146   TRIG 79 02/16/2017 1146   HDL 52 02/16/2017 1146   CHOLHDL 2.5 09/11/2015 1301   VLDL 11 09/11/2015 1301   LDLCALC 83 02/16/2017 1146   LDLDIRECT 181.4 11/16/2012 1419    Physical Exam:    VS:  BP (!) 154/76   Pulse 66   Ht 5' 9.75" (1.772 m)   Wt 192 lb 12.8 oz (87.5 kg)   BMI 27.86 kg/m     Wt Readings from Last 3 Encounters:  01/17/18 192 lb 12.8 oz (87.5 kg)  02/16/17 182 lb (82.6 kg)  06/20/16 208 lb (94.3 kg)     GEN:  Well nourished, well developed in no acute distress HEENT: Normal NECK: No JVD; No carotid bruits CARDIAC: RRR, crisp valve click, rubs, gallops,sternotomy scar RESPIRATORY:  Clear to auscultation without rales, wheezing or rhonchi  ABDOMEN: Soft, non-tender, non-distended MUSCULOSKELETAL:  No edema; No deformity  SKIN: Warm and dry NEUROLOGIC:  Alert and oriented x 3 PSYCHIATRIC:  Normal affect   ASSESSMENT:    1. Intercostal pain   2. PAF (paroxysmal atrial fibrillation) (HCC)   3. S/P AVR (aortic valve replacement)-redo with total aortic root replacement using a mechanical valve-conduit (St. Jude 23 mm aortic valve and 25 mm Hemashield conduit, serial #[id number]).   4. S/P AVR, #23 mm St Jude prosthesis in 1988 for bacterial endocarditis   5. Marijuana use    PLAN:    In order of problems listed above:  Intercostal pain Patient is recovering from a cold, improving on mucinex. He also strained picking up large boulders on Sept 8. Chest pain was worse with deep inspiration, movement, and was reproducible on palpation on exam. I do not suspect a cardiac etiology. We discussed costochondritis pain in addition to possible inflammation with frequent marijuana smoking.   PAF (paroxysmal atrial fibrillation) (HCC) - Plan: EKG 12-Lead In sinus today.  S/P AVR (aortic valve replacement)-redo with total aortic root replacement using a mechanical valve-conduit (St. Jude 23 mm aortic valve and 25 mm Hemashield conduit, serial  #[id number]). S/P AVR, #23 mm St Jude prosthesis in 1988 for  bacterial endocarditis Crisp valve click. Compliant on coumadin.   Marijuana use Advised him to stop smoking, especially if he is getting asthma-like symptoms when he smokes.  Follow up in 6 months.     Medication Adjustments/Labs and Tests Ordered: Current medicines are reviewed at length with the patient today.  Concerns regarding medicines are outlined above.  Orders Placed This Encounter  Procedures  . EKG 12-Lead   Meds ordered this encounter  Medications  . metoprolol tartrate (LOPRESSOR) 25 MG tablet    Sig: Take 0.5 tablets (12.5 mg total) by mouth 2 (two) times daily.    Dispense:  90 tablet    Refill:  3    Signed, Marcelino Dusterngela Nicole Duke, [place]  01/17/2018 2:47 PM    [facility]

## 2018-01-17 ENCOUNTER — Encounter: Payer: Self-pay | Admitting: Physician Assistant

## 2018-01-17 ENCOUNTER — Ambulatory Visit: Payer: 59 | Admitting: Physician Assistant

## 2018-01-17 VITALS — BP 154/76 | HR 66 | Ht 69.75 in | Wt 192.8 lb

## 2018-01-17 DIAGNOSIS — F1290 Cannabis use, unspecified, uncomplicated: Secondary | ICD-10-CM | POA: Diagnosis not present

## 2018-01-17 DIAGNOSIS — Z952 Presence of prosthetic heart valve: Secondary | ICD-10-CM | POA: Diagnosis not present

## 2018-01-17 DIAGNOSIS — I480 Paroxysmal atrial fibrillation: Secondary | ICD-10-CM

## 2018-01-17 DIAGNOSIS — R0782 Intercostal pain: Secondary | ICD-10-CM

## 2018-01-17 MED ORDER — METOPROLOL TARTRATE 25 MG PO TABS: 12.5000 mg | ORAL_TABLET | Freq: Two times a day (BID) | ORAL | 3 refills | Status: DC

## 2018-01-17 NOTE — Patient Instructions (Signed)
Medication Instructions:  Continue current medications  If you need a refill on your cardiac medications before your next appointment, please call your pharmacy.  Labwork: None Ordered   Testing/Procedures: None Ordered  Follow-Up: Your physician wants you to follow-up in: 6 Months with Dr Swaziland. You should receive a reminder letter in the mail two months in advance. If you do not receive a letter, please call our office in [phone] to schedule your follow-up appointment.     Thank you for choosing [facility] at [facility]!!

## 2018-01-31 ENCOUNTER — Ambulatory Visit: Payer: 59 | Admitting: *Deleted

## 2018-01-31 DIAGNOSIS — I359 Nonrheumatic aortic valve disorder, unspecified: Secondary | ICD-10-CM

## 2018-01-31 DIAGNOSIS — I480 Paroxysmal atrial fibrillation: Secondary | ICD-10-CM | POA: Diagnosis not present

## 2018-01-31 DIAGNOSIS — Z952 Presence of prosthetic heart valve: Secondary | ICD-10-CM | POA: Diagnosis not present

## 2018-01-31 LAB — POCT INR: INR: 3.5 — AB (ref 2.0–3.0)

## 2018-01-31 NOTE — Patient Instructions (Signed)
Description   Continue Coumadin 1 tablet daily. Recheck in 6 weeks. Call with any problems or new medications [phone]     

## 2018-03-14 ENCOUNTER — Ambulatory Visit: Payer: 59 | Admitting: *Deleted

## 2018-03-14 DIAGNOSIS — I480 Paroxysmal atrial fibrillation: Secondary | ICD-10-CM

## 2018-03-14 DIAGNOSIS — Z952 Presence of prosthetic heart valve: Secondary | ICD-10-CM | POA: Diagnosis not present

## 2018-03-14 DIAGNOSIS — I359 Nonrheumatic aortic valve disorder, unspecified: Secondary | ICD-10-CM

## 2018-03-14 LAB — POCT INR: INR: 3.3 — AB (ref 2.0–3.0)

## 2018-03-14 NOTE — Patient Instructions (Signed)
Description   Continue Coumadin 1 tablet daily. Recheck in 6 weeks. Call with any problems or new medications [phone]

## 2018-04-23 ENCOUNTER — Ambulatory Visit: Payer: No Typology Code available for payment source | Admitting: *Deleted

## 2018-04-23 DIAGNOSIS — I359 Nonrheumatic aortic valve disorder, unspecified: Secondary | ICD-10-CM | POA: Diagnosis not present

## 2018-04-23 DIAGNOSIS — Z5181 Encounter for therapeutic drug level monitoring: Secondary | ICD-10-CM

## 2018-04-23 DIAGNOSIS — Z952 Presence of prosthetic heart valve: Secondary | ICD-10-CM | POA: Diagnosis not present

## 2018-04-23 DIAGNOSIS — I480 Paroxysmal atrial fibrillation: Secondary | ICD-10-CM | POA: Diagnosis not present

## 2018-04-23 LAB — POCT INR: INR: 3.6 — AB (ref 2.0–3.0)

## 2018-04-23 NOTE — Patient Instructions (Signed)
Description   Today take 1/2 tablet then continue Coumadin 1 tablet daily. Recheck in 6 weeks. Call with any problems or new medications [phone]

## 2018-04-30 ENCOUNTER — Other Ambulatory Visit: Payer: Self-pay

## 2018-04-30 MED ORDER — WARFARIN SODIUM 5 MG PO TABS: ORAL_TABLET | ORAL | 1 refills | Status: DC

## 2018-06-06 ENCOUNTER — Ambulatory Visit: Payer: No Typology Code available for payment source | Admitting: *Deleted

## 2018-06-06 DIAGNOSIS — Z952 Presence of prosthetic heart valve: Secondary | ICD-10-CM | POA: Diagnosis not present

## 2018-06-06 DIAGNOSIS — I480 Paroxysmal atrial fibrillation: Secondary | ICD-10-CM | POA: Diagnosis not present

## 2018-06-06 DIAGNOSIS — I359 Nonrheumatic aortic valve disorder, unspecified: Secondary | ICD-10-CM

## 2018-06-06 DIAGNOSIS — Z5181 Encounter for therapeutic drug level monitoring: Secondary | ICD-10-CM

## 2018-06-06 LAB — POCT INR: INR: 2.1 (ref 2.0–3.0)

## 2018-06-06 NOTE — Patient Instructions (Signed)
Description   Today take 1.5 tablets then continue Coumadin 1 tablet daily. Recheck in 5 weeks. Call with any problems or new medications [phone]

## 2018-06-29 DIAGNOSIS — M65342 Trigger finger, left ring finger: Secondary | ICD-10-CM | POA: Insufficient documentation

## 2018-07-11 ENCOUNTER — Other Ambulatory Visit: Payer: Self-pay

## 2018-07-11 ENCOUNTER — Ambulatory Visit: Payer: No Typology Code available for payment source | Admitting: Pharmacist

## 2018-07-11 DIAGNOSIS — Z952 Presence of prosthetic heart valve: Secondary | ICD-10-CM | POA: Diagnosis not present

## 2018-07-11 DIAGNOSIS — I359 Nonrheumatic aortic valve disorder, unspecified: Secondary | ICD-10-CM | POA: Diagnosis not present

## 2018-07-11 DIAGNOSIS — I480 Paroxysmal atrial fibrillation: Secondary | ICD-10-CM

## 2018-07-11 LAB — POCT INR: INR: 1.9 — AB (ref 2.0–3.0)

## 2018-07-11 NOTE — Patient Instructions (Signed)
Description   Take 1.5 tablets today and tomorrow, then start taking 1 tablet daily except 1.5 tablets on Sundays. Recheck in 3 weeks. Call with any problems or new medications [phone]

## 2018-07-15 ENCOUNTER — Other Ambulatory Visit: Payer: Self-pay | Admitting: Cardiology

## 2018-07-16 ENCOUNTER — Telehealth: Payer: Self-pay | Admitting: Cardiology

## 2018-07-16 NOTE — Telephone Encounter (Signed)
° °  [employer], patient had extraction today. Will he need after procedure antibiotic? Patient did take Amoxicillin prior to extraction.  Please call [phone]

## 2018-07-16 NOTE — Telephone Encounter (Signed)
Returned call to Dr.Finn's office Dr.Jordan advised patient needs SBE prophylaxis before dental work,will not need antibiotics after.

## 2018-07-31 ENCOUNTER — Telehealth: Payer: Self-pay

## 2018-07-31 NOTE — Telephone Encounter (Signed)
lmom for prescreen/drive thru 

## 2018-08-07 ENCOUNTER — Telehealth: Payer: Self-pay | Admitting: Pharmacist

## 2018-08-07 NOTE — Telephone Encounter (Signed)
Pt LMOM about rescheduling appt. Needs afternoon. Returned call and LM as unable to reach pt.

## 2018-08-07 NOTE — Telephone Encounter (Signed)

## 2018-08-09 ENCOUNTER — Ambulatory Visit (INDEPENDENT_AMBULATORY_CARE_PROVIDER_SITE_OTHER): Payer: No Typology Code available for payment source | Admitting: *Deleted

## 2018-08-09 ENCOUNTER — Other Ambulatory Visit: Payer: Self-pay

## 2018-08-09 DIAGNOSIS — Z952 Presence of prosthetic heart valve: Secondary | ICD-10-CM | POA: Diagnosis not present

## 2018-08-09 DIAGNOSIS — I480 Paroxysmal atrial fibrillation: Secondary | ICD-10-CM | POA: Diagnosis not present

## 2018-08-09 DIAGNOSIS — Z5181 Encounter for therapeutic drug level monitoring: Secondary | ICD-10-CM | POA: Diagnosis not present

## 2018-08-09 DIAGNOSIS — I359 Nonrheumatic aortic valve disorder, unspecified: Secondary | ICD-10-CM | POA: Diagnosis not present

## 2018-08-09 LAB — POCT INR: INR: 3.5 — AB (ref 2.0–3.0)

## 2018-09-05 ENCOUNTER — Telehealth: Payer: Self-pay

## 2018-09-05 NOTE — Telephone Encounter (Signed)

## 2018-09-06 ENCOUNTER — Ambulatory Visit (INDEPENDENT_AMBULATORY_CARE_PROVIDER_SITE_OTHER): Payer: No Typology Code available for payment source | Admitting: Pharmacist

## 2018-09-06 ENCOUNTER — Other Ambulatory Visit: Payer: Self-pay

## 2018-09-06 DIAGNOSIS — Z952 Presence of prosthetic heart valve: Secondary | ICD-10-CM

## 2018-09-06 DIAGNOSIS — I359 Nonrheumatic aortic valve disorder, unspecified: Secondary | ICD-10-CM | POA: Diagnosis not present

## 2018-09-06 DIAGNOSIS — I480 Paroxysmal atrial fibrillation: Secondary | ICD-10-CM

## 2018-09-06 DIAGNOSIS — Z5181 Encounter for therapeutic drug level monitoring: Secondary | ICD-10-CM | POA: Diagnosis not present

## 2018-09-06 LAB — POCT INR: INR: 4 — AB (ref 2.0–3.0)

## 2018-09-24 ENCOUNTER — Telehealth: Payer: Self-pay

## 2018-09-24 NOTE — Telephone Encounter (Signed)

## 2018-10-01 ENCOUNTER — Ambulatory Visit: Payer: No Typology Code available for payment source | Admitting: *Deleted

## 2018-10-01 ENCOUNTER — Other Ambulatory Visit: Payer: Self-pay

## 2018-10-01 DIAGNOSIS — I480 Paroxysmal atrial fibrillation: Secondary | ICD-10-CM

## 2018-10-01 DIAGNOSIS — I359 Nonrheumatic aortic valve disorder, unspecified: Secondary | ICD-10-CM

## 2018-10-01 DIAGNOSIS — Z952 Presence of prosthetic heart valve: Secondary | ICD-10-CM | POA: Diagnosis not present

## 2018-10-01 DIAGNOSIS — Z5181 Encounter for therapeutic drug level monitoring: Secondary | ICD-10-CM | POA: Diagnosis not present

## 2018-10-01 LAB — POCT INR: INR: 3.7 — AB (ref 2.0–3.0)

## 2018-10-01 NOTE — Patient Instructions (Signed)
Description   Take half a tablet today, then change dose to 1 tablet daily.  Recheck in 4 weeks.  Call with any problems or new medications [phone]

## 2018-10-25 ENCOUNTER — Telehealth: Payer: Self-pay

## 2018-10-25 NOTE — Telephone Encounter (Signed)
lmom for prescreen  

## 2018-10-31 ENCOUNTER — Telehealth: Payer: Self-pay

## 2018-10-31 NOTE — Telephone Encounter (Signed)

## 2018-11-01 ENCOUNTER — Other Ambulatory Visit: Payer: Self-pay

## 2018-11-01 ENCOUNTER — Encounter (INDEPENDENT_AMBULATORY_CARE_PROVIDER_SITE_OTHER): Payer: Self-pay

## 2018-11-01 ENCOUNTER — Ambulatory Visit: Payer: No Typology Code available for payment source | Admitting: Pharmacist

## 2018-11-01 DIAGNOSIS — I359 Nonrheumatic aortic valve disorder, unspecified: Secondary | ICD-10-CM

## 2018-11-01 DIAGNOSIS — I480 Paroxysmal atrial fibrillation: Secondary | ICD-10-CM

## 2018-11-01 DIAGNOSIS — Z5181 Encounter for therapeutic drug level monitoring: Secondary | ICD-10-CM

## 2018-11-01 DIAGNOSIS — Z952 Presence of prosthetic heart valve: Secondary | ICD-10-CM

## 2018-11-01 LAB — POCT INR: INR: 3.1 — AB (ref 2.0–3.0)

## 2018-11-01 NOTE — Patient Instructions (Signed)
Continue taking 1 tablet daily.  Recheck in 5 weeks.  Call with any problems or new medications [phone]

## 2018-11-16 ENCOUNTER — Other Ambulatory Visit: Payer: Self-pay | Admitting: Cardiology

## 2018-11-19 NOTE — Telephone Encounter (Signed)
Refill Request.  

## 2018-12-06 ENCOUNTER — Ambulatory Visit (INDEPENDENT_AMBULATORY_CARE_PROVIDER_SITE_OTHER): Payer: No Typology Code available for payment source | Admitting: *Deleted

## 2018-12-06 ENCOUNTER — Other Ambulatory Visit: Payer: Self-pay

## 2018-12-06 DIAGNOSIS — I359 Nonrheumatic aortic valve disorder, unspecified: Secondary | ICD-10-CM | POA: Diagnosis not present

## 2018-12-06 DIAGNOSIS — I480 Paroxysmal atrial fibrillation: Secondary | ICD-10-CM

## 2018-12-06 DIAGNOSIS — Z952 Presence of prosthetic heart valve: Secondary | ICD-10-CM

## 2018-12-06 LAB — POCT INR: INR: 4.5 — AB (ref 2.0–3.0)

## 2018-12-06 NOTE — Patient Instructions (Addendum)
Description    No coumadin today, then continue taking 1 tablet daily.  Recheck INR in 3 weeks. Eat an extra serving of greens. Call with any problems or new medications [phone]

## 2018-12-27 ENCOUNTER — Ambulatory Visit (INDEPENDENT_AMBULATORY_CARE_PROVIDER_SITE_OTHER): Payer: No Typology Code available for payment source | Admitting: *Deleted

## 2018-12-27 ENCOUNTER — Other Ambulatory Visit: Payer: Self-pay

## 2018-12-27 DIAGNOSIS — I480 Paroxysmal atrial fibrillation: Secondary | ICD-10-CM

## 2018-12-27 DIAGNOSIS — I359 Nonrheumatic aortic valve disorder, unspecified: Secondary | ICD-10-CM

## 2018-12-27 DIAGNOSIS — Z5181 Encounter for therapeutic drug level monitoring: Secondary | ICD-10-CM

## 2018-12-27 DIAGNOSIS — Z952 Presence of prosthetic heart valve: Secondary | ICD-10-CM | POA: Diagnosis not present

## 2018-12-27 LAB — POCT INR: INR: 3.3 — AB (ref 2.0–3.0)

## 2018-12-27 NOTE — Patient Instructions (Signed)
Description   Continue taking 1 tablet daily.  Recheck INR in 5 weeks. Call with any problems or new medications [phone]      

## 2019-01-31 ENCOUNTER — Other Ambulatory Visit: Payer: Self-pay

## 2019-01-31 ENCOUNTER — Ambulatory Visit (INDEPENDENT_AMBULATORY_CARE_PROVIDER_SITE_OTHER): Payer: No Typology Code available for payment source | Admitting: Pharmacist

## 2019-01-31 DIAGNOSIS — I480 Paroxysmal atrial fibrillation: Secondary | ICD-10-CM | POA: Diagnosis not present

## 2019-01-31 DIAGNOSIS — I359 Nonrheumatic aortic valve disorder, unspecified: Secondary | ICD-10-CM

## 2019-01-31 DIAGNOSIS — Z952 Presence of prosthetic heart valve: Secondary | ICD-10-CM

## 2019-01-31 DIAGNOSIS — Z5181 Encounter for therapeutic drug level monitoring: Secondary | ICD-10-CM | POA: Diagnosis not present

## 2019-01-31 LAB — POCT INR: INR: 3.4 — AB (ref 2.0–3.0)

## 2019-01-31 NOTE — Patient Instructions (Signed)
Continue taking 1 tablet daily.  Recheck INR in 6 weeks. Call with any problems or new medications [phone]

## 2019-02-28 ENCOUNTER — Other Ambulatory Visit: Payer: Self-pay | Admitting: Cardiology

## 2019-02-28 NOTE — Telephone Encounter (Signed)
Refill Request.  

## 2019-03-14 ENCOUNTER — Ambulatory Visit (INDEPENDENT_AMBULATORY_CARE_PROVIDER_SITE_OTHER): Payer: Self-pay | Admitting: *Deleted

## 2019-03-14 ENCOUNTER — Other Ambulatory Visit: Payer: Self-pay

## 2019-03-14 DIAGNOSIS — Z952 Presence of prosthetic heart valve: Secondary | ICD-10-CM

## 2019-03-14 DIAGNOSIS — I359 Nonrheumatic aortic valve disorder, unspecified: Secondary | ICD-10-CM

## 2019-03-14 DIAGNOSIS — I480 Paroxysmal atrial fibrillation: Secondary | ICD-10-CM

## 2019-03-14 LAB — POCT INR: INR: 2.6 (ref 2.0–3.0)

## 2019-03-14 NOTE — Patient Instructions (Signed)
Description   Continue taking 1 tablet daily.  Recheck INR in 7 weeks. Call with any problems or new medications [phone]

## 2019-05-02 ENCOUNTER — Other Ambulatory Visit: Payer: Self-pay

## 2019-05-02 ENCOUNTER — Ambulatory Visit (INDEPENDENT_AMBULATORY_CARE_PROVIDER_SITE_OTHER): Payer: Self-pay | Admitting: *Deleted

## 2019-05-02 DIAGNOSIS — I480 Paroxysmal atrial fibrillation: Secondary | ICD-10-CM

## 2019-05-02 DIAGNOSIS — I359 Nonrheumatic aortic valve disorder, unspecified: Secondary | ICD-10-CM

## 2019-05-02 DIAGNOSIS — Z952 Presence of prosthetic heart valve: Secondary | ICD-10-CM

## 2019-05-02 DIAGNOSIS — Z5181 Encounter for therapeutic drug level monitoring: Secondary | ICD-10-CM

## 2019-05-02 LAB — POCT INR: INR: 5.5 — AB (ref 2.0–3.0)

## 2019-05-02 NOTE — Patient Instructions (Addendum)
Description   Do not take any today and No Coumadin tomorrow and take 1/2 tablet on Saturday then continue taking 1 tablet daily.  Recheck INR in 3 weeks per request. Call with any problems or new medications [phone]

## 2019-05-23 ENCOUNTER — Other Ambulatory Visit: Payer: Self-pay

## 2019-05-23 ENCOUNTER — Ambulatory Visit (INDEPENDENT_AMBULATORY_CARE_PROVIDER_SITE_OTHER): Payer: Self-pay

## 2019-05-23 DIAGNOSIS — Z5181 Encounter for therapeutic drug level monitoring: Secondary | ICD-10-CM

## 2019-05-23 DIAGNOSIS — I359 Nonrheumatic aortic valve disorder, unspecified: Secondary | ICD-10-CM

## 2019-05-23 DIAGNOSIS — Z952 Presence of prosthetic heart valve: Secondary | ICD-10-CM

## 2019-05-23 DIAGNOSIS — I480 Paroxysmal atrial fibrillation: Secondary | ICD-10-CM

## 2019-05-23 LAB — POCT INR: INR: 2.8 (ref 2.0–3.0)

## 2019-05-23 NOTE — Patient Instructions (Signed)
Description   Continue on same dosage 1 tablet daily.  Recheck INR in 5 weeks. Call with any problems or new medications [phone]

## 2019-06-13 ENCOUNTER — Other Ambulatory Visit: Payer: Self-pay | Admitting: Cardiology

## 2019-06-17 ENCOUNTER — Other Ambulatory Visit: Payer: Self-pay

## 2019-06-17 MED ORDER — METOPROLOL TARTRATE 25 MG PO TABS: 12.5000 mg | ORAL_TABLET | Freq: Two times a day (BID) | ORAL | 0 refills | Status: DC

## 2019-06-27 ENCOUNTER — Ambulatory Visit (INDEPENDENT_AMBULATORY_CARE_PROVIDER_SITE_OTHER): Payer: Self-pay | Admitting: *Deleted

## 2019-06-27 ENCOUNTER — Other Ambulatory Visit: Payer: Self-pay

## 2019-06-27 DIAGNOSIS — Z952 Presence of prosthetic heart valve: Secondary | ICD-10-CM

## 2019-06-27 DIAGNOSIS — I480 Paroxysmal atrial fibrillation: Secondary | ICD-10-CM

## 2019-06-27 DIAGNOSIS — Z5181 Encounter for therapeutic drug level monitoring: Secondary | ICD-10-CM

## 2019-06-27 DIAGNOSIS — I359 Nonrheumatic aortic valve disorder, unspecified: Secondary | ICD-10-CM

## 2019-06-27 LAB — POCT INR: INR: 1.8 — AB (ref 2.0–3.0)

## 2019-06-27 NOTE — Patient Instructions (Signed)
Description   Take 1.5 tablets today and tomorrow, then continue on same dosage 1 tablet daily.  Recheck INR in 2 weeks. Call with any problems or new medications [phone]

## 2019-07-19 ENCOUNTER — Ambulatory Visit (INDEPENDENT_AMBULATORY_CARE_PROVIDER_SITE_OTHER): Payer: Self-pay | Admitting: Pharmacist

## 2019-07-19 ENCOUNTER — Other Ambulatory Visit: Payer: Self-pay

## 2019-07-19 DIAGNOSIS — Z952 Presence of prosthetic heart valve: Secondary | ICD-10-CM

## 2019-07-19 DIAGNOSIS — Z5181 Encounter for therapeutic drug level monitoring: Secondary | ICD-10-CM

## 2019-07-19 DIAGNOSIS — I359 Nonrheumatic aortic valve disorder, unspecified: Secondary | ICD-10-CM

## 2019-07-19 DIAGNOSIS — I480 Paroxysmal atrial fibrillation: Secondary | ICD-10-CM

## 2019-07-19 LAB — POCT INR: INR: 4.4 — AB (ref 2.0–3.0)

## 2019-07-19 NOTE — Patient Instructions (Signed)
Hold coumadin tonight then continue on same dosage 1 tablet daily.  Recheck INR in 3 weeks. Call with any problems or new medications [phone]

## 2019-08-08 ENCOUNTER — Ambulatory Visit (INDEPENDENT_AMBULATORY_CARE_PROVIDER_SITE_OTHER): Payer: Self-pay | Admitting: *Deleted

## 2019-08-08 ENCOUNTER — Other Ambulatory Visit: Payer: Self-pay

## 2019-08-08 DIAGNOSIS — Z952 Presence of prosthetic heart valve: Secondary | ICD-10-CM

## 2019-08-08 DIAGNOSIS — I480 Paroxysmal atrial fibrillation: Secondary | ICD-10-CM

## 2019-08-08 DIAGNOSIS — I359 Nonrheumatic aortic valve disorder, unspecified: Secondary | ICD-10-CM

## 2019-08-08 DIAGNOSIS — Z5181 Encounter for therapeutic drug level monitoring: Secondary | ICD-10-CM

## 2019-08-08 LAB — POCT INR: INR: 3.4 — AB (ref 2.0–3.0)

## 2019-08-08 NOTE — Patient Instructions (Addendum)
Description   Continue on same dosage 1 tablet daily.  Recheck INR in 5 weeks per pt request. Call with any problems or new medications [phone]

## 2019-08-09 ENCOUNTER — Ambulatory Visit: Payer: Self-pay | Attending: Internal Medicine

## 2019-08-09 DIAGNOSIS — Z23 Encounter for immunization: Secondary | ICD-10-CM

## 2019-08-09 NOTE — Progress Notes (Signed)
   Covid-19 Vaccination Clinic  Name:  [name] [name]    MRN: [id number] DOB: 14-Dec-1955  08/09/2019  Mr. [name] was observed post Covid-19 immunization for 15 minutes without incident. He was provided with Vaccine Information Sheet and instruction to access the V-Safe system.   Mr. [name] was instructed to call 911 with any severe reactions post vaccine: Marland Kitchen Difficulty breathing  . Swelling of face and throat  . A fast heartbeat  . A bad rash all over body  . Dizziness and weakness   Immunizations Administered    Name Date Dose VIS Date Route   Pfizer COVID-19 Vaccine 08/09/2019  1:16 PM 0.3 mL 04/05/2019 Intramuscular   Manufacturer: [employer], [employer]   Lot: [id number]   NDC: [address]-7

## 2019-09-02 ENCOUNTER — Ambulatory Visit: Payer: Self-pay | Attending: Internal Medicine

## 2019-09-02 DIAGNOSIS — Z23 Encounter for immunization: Secondary | ICD-10-CM

## 2019-09-02 NOTE — Progress Notes (Signed)
   Covid-19 Vaccination Clinic  Name:  [name] [name]    MRN: [id number] DOB: March 29, 1956  09/02/2019  [name] [name] was observed post Covid-19 immunization for 15 minutes without incident. He was provided with Vaccine Information Sheet and instruction to access the V-Safe system.   [name] [name] was instructed to call 911 with any severe reactions post vaccine: Marland Kitchen Difficulty breathing  . Swelling of face and throat  . A fast heartbeat  . A bad rash all over body  . Dizziness and weakness   Immunizations Administered    Name Date Dose VIS Date Route   Pfizer COVID-19 Vaccine 09/02/2019  1:27 PM 0.3 mL 06/19/2018 Intramuscular   Manufacturer: [employer], [employer]   Lot: [id number]   NDC: [address]-5

## 2019-09-12 ENCOUNTER — Encounter (INDEPENDENT_AMBULATORY_CARE_PROVIDER_SITE_OTHER): Payer: Self-pay

## 2019-09-12 ENCOUNTER — Ambulatory Visit (INDEPENDENT_AMBULATORY_CARE_PROVIDER_SITE_OTHER): Payer: Self-pay

## 2019-09-12 ENCOUNTER — Other Ambulatory Visit: Payer: Self-pay

## 2019-09-12 DIAGNOSIS — Z952 Presence of prosthetic heart valve: Secondary | ICD-10-CM

## 2019-09-12 DIAGNOSIS — Z5181 Encounter for therapeutic drug level monitoring: Secondary | ICD-10-CM

## 2019-09-12 DIAGNOSIS — I480 Paroxysmal atrial fibrillation: Secondary | ICD-10-CM

## 2019-09-12 DIAGNOSIS — I359 Nonrheumatic aortic valve disorder, unspecified: Secondary | ICD-10-CM

## 2019-09-12 LAB — POCT INR: INR: 2.2 (ref 2.0–3.0)

## 2019-09-12 NOTE — Patient Instructions (Addendum)
Description   Take 1.5 tablets today, then resume same dosage 1 tablet daily.  Recheck INR in 5 weeks per pt request. Call with any problems or new medications [phone]

## 2019-09-24 ENCOUNTER — Other Ambulatory Visit: Payer: Self-pay | Admitting: Cardiology

## 2019-09-26 ENCOUNTER — Other Ambulatory Visit: Payer: Self-pay | Admitting: Pharmacist

## 2019-09-26 MED ORDER — WARFARIN SODIUM 5 MG PO TABS: ORAL_TABLET | ORAL | 0 refills | Status: DC

## 2019-10-17 ENCOUNTER — Ambulatory Visit (INDEPENDENT_AMBULATORY_CARE_PROVIDER_SITE_OTHER): Payer: Self-pay | Admitting: *Deleted

## 2019-10-17 ENCOUNTER — Other Ambulatory Visit: Payer: Self-pay

## 2019-10-17 DIAGNOSIS — I359 Nonrheumatic aortic valve disorder, unspecified: Secondary | ICD-10-CM

## 2019-10-17 DIAGNOSIS — Z5181 Encounter for therapeutic drug level monitoring: Secondary | ICD-10-CM

## 2019-10-17 DIAGNOSIS — Z952 Presence of prosthetic heart valve: Secondary | ICD-10-CM

## 2019-10-17 DIAGNOSIS — I480 Paroxysmal atrial fibrillation: Secondary | ICD-10-CM

## 2019-10-17 LAB — POCT INR: INR: 3.1 — AB (ref 2.0–3.0)

## 2019-10-17 NOTE — Patient Instructions (Signed)
Description   Continue taking 1 tablet daily.  Recheck INR in 5 weeks. Call with any problems or new medications [phone]

## 2019-11-10 ENCOUNTER — Other Ambulatory Visit: Payer: Self-pay | Admitting: Cardiology

## 2019-11-12 ENCOUNTER — Telehealth: Payer: Self-pay

## 2019-11-12 NOTE — Telephone Encounter (Signed)
Called patient no answer.Left message on personal voice mail I received a message you need a refill for atorvastatin.Advised your last appointment 01/17/18.Advised to call me back to schedule a follow up appointment.

## 2019-11-14 ENCOUNTER — Other Ambulatory Visit: Payer: Self-pay | Admitting: Cardiovascular Disease

## 2019-11-15 MED ORDER — METOPROLOL TARTRATE 25 MG PO TABS: 12.5000 mg | ORAL_TABLET | Freq: Two times a day (BID) | ORAL | 1 refills | Status: DC

## 2019-11-15 NOTE — Telephone Encounter (Signed)
Spoke to patient appointment scheduled with Edd Fabian NP 8/20 at 11:15 am.Refills sent to pharmacy.

## 2019-11-21 ENCOUNTER — Ambulatory Visit (INDEPENDENT_AMBULATORY_CARE_PROVIDER_SITE_OTHER): Payer: Self-pay

## 2019-11-21 ENCOUNTER — Other Ambulatory Visit: Payer: Self-pay

## 2019-11-21 DIAGNOSIS — I480 Paroxysmal atrial fibrillation: Secondary | ICD-10-CM

## 2019-11-21 DIAGNOSIS — Z5181 Encounter for therapeutic drug level monitoring: Secondary | ICD-10-CM

## 2019-11-21 DIAGNOSIS — Z952 Presence of prosthetic heart valve: Secondary | ICD-10-CM

## 2019-11-21 DIAGNOSIS — I359 Nonrheumatic aortic valve disorder, unspecified: Secondary | ICD-10-CM

## 2019-11-21 LAB — POCT INR: INR: 3.5 — AB (ref 2.0–3.0)

## 2019-11-21 NOTE — Patient Instructions (Signed)
Description   Continue taking 1 tablet daily.  Recheck INR in 6 weeks. Call with any problems or new medications [phone]

## 2019-12-10 NOTE — Progress Notes (Signed)
Cardiology Clinic Note   Patient Name: [name] [name] Date of Encounter: 12/13/2019  Primary Care Provider:  Clovis Riley, L.August Saucer, MD Primary Cardiologist:  Peter Swaziland, MD  Patient Profile    [name] [name] 64 year old male presents for follow-up evaluation of his chest pain.  Past Medical History    Past Medical History:  Diagnosis Date  . Anxiety   . Ascending aortic aneurysm (HCC) 01/07/2014  . Asthma   . Chest pain, atypical   . Chronic anticoagulation   . Depression   . Hyperlipidemia   . Hypertension   . S/P AVR    for endocarditis   Past Surgical History:  Procedure Laterality Date  . AORTIC VALVE REPLACEMENT  1988   66mm St. Jude valve  . AORTIC VALVE REPLACEMENT N/A 01/23/2014   Procedure: REDO AORTIC VALVE REPLACEMENT (AVR) with 23 St. Jude Mechanical Aortic Valved Graft;  Surgeon: Kerin Perna, MD;  Location: [facility] OR;  Service: Open Heart Surgery;  Laterality: N/A;  . APPLICATION OF WOUND VAC N/A 02/22/2014   Procedure: APPLICATION OF WOUND VAC;  Surgeon: Kerin Perna, MD;  Location: [facility] OR;  Service: Vascular;  Laterality: N/A;  . BENTALL PROCEDURE N/A 01/23/2014   Procedure: BENTALL PROCEDURE;  Surgeon: Kerin Perna, MD;  Location: [facility] OR;  Service: Open Heart Surgery;  Laterality: N/A;  . CARDIAC CATHETERIZATION    . DOPPLER ECHOCARDIOGRAPHY  06/12/1996   EF 55-60%  . HAND SURGERY    . I & D EXTREMITY N/A 02/22/2014   Procedure: IRRIGATION AND DEBRIDEMENT EXTREMITY-CHEST;  Surgeon: Kerin Perna, MD;  Location: [facility] OR;  Service: Vascular;  Laterality: N/A;  PULSE LAVAGE  . INTRAOPERATIVE TRANSESOPHAGEAL ECHOCARDIOGRAM N/A 01/23/2014   Procedure: INTRAOPERATIVE TRANSESOPHAGEAL ECHOCARDIOGRAM;  Surgeon: Kerin Perna, MD;  Location: [facility] OR;  Service: Open Heart Surgery;  Laterality: N/A;  . 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  On chronic anticoagulation/Coumadin.  Underwent cardiac catheterization 2015 which showed nonobstructive CAD.  He does require SBE prophylaxis.  He was last seen by Micah Flesher PA-C on 01/17/2018.  During that time he had several complaints.  He indicated that he smoked marijuana as a young man and had stopped in his mid 47s.  However, 6-1/2 years ago he started smoking marijuana again with his girlfriend and experienced lung inflammation as well as asthma-like symptoms when he smoked.  His girlfriend died around 5 years ago but he continued to smoke.  He reported that September 7 he smoked marijuana and took Viagra with his new girlfriend.  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  On September 9 he experienced chest wall pain but also stated that he had an upper respiratory cold.  His chest pain was described as lateral chest wall discomfort bilaterally.  It was worth worse with deep inspiration and certain movements.  After September 9 he worked doing labor-intensive tasks/jobs on his farm without chest pain.  He recovered from his chest cold by taking Mucinex.  He had begun to  feel much better but was concerned about his cardiac health.  He was concerned about taking Viagra with this cardiac medications.  Finally he mentions he was concerned about pneumonia but indicated that his PCP did not think he was suffering from pneumonia.  He presents to the clinic today for follow-up evaluation and states he feels well.  He continues to be active maintaining his property.  He states that he does have some dietary  indiscretion and uses salt.  His blood pressure today is 142/72.  He states that he was laid off during the COVID-19 pandemic and is now drawn on 1.  This is okay with him because he states he has several things to do around his property.  He has stopped smoking marijuana and feels that he is breathing better with this.  He takes his fluticasone inhaler as needed.  He wishes to defer his echocardiogram at this time due to lack of insurance.  I will have him follow a low-sodium diet and give him salty 6 diet sheet, maintain his physical activity and follow-up with Dr. Swaziland in 1 year.  Today he denies chest pain, shortness of breath, lower extremity edema, fatigue, palpitations, melena, hematuria, hemoptysis, diaphoresis, weakness, presyncope, syncope, orthopnea, and PND.   Home Medications    Prior to Admission medications   Medication Sig Start Date End Date Taking? Authorizing Provider  ALPRAZolam Prudy Feeler) 0.5 MG tablet Take 0.5 mg by mouth daily as needed for anxiety.     [provider]  atorvastatin (LIPITOR) 80 MG tablet Take 1 tablet by mouth once daily 11/15/19   Swaziland, Peter M, MD  diphenhydramine-acetaminophen (TYLENOL PM) 25-500 MG TABS Take 1 tablet by mouth at bedtime as needed (sleep).    [provider]  Fluticasone Furoate (ARNUITY ELLIPTA) 200 MCG/ACT AEPB Inhale 1 puff into the lungs as needed.    [provider]  metoprolol tartrate (LOPRESSOR) 25 MG tablet Take 0.5 tablets (12.5 mg total) by mouth 2 (two) times daily. Keep appointment 8/20 11/15/19   Swaziland, Peter M, MD  polyethylene glycol [facility] Ethelene Hal) packet Take 17 g by mouth 2 (two) times daily. 02/14/14   Wayland Salinas, MD  warfarin (COUMADIN) 5 MG tablet TAKE 1  TABLET BY MOUTH ONCE DAILY OR AS DIRECTED BY COUMADIN CLINIC 09/26/19   Swaziland, Peter M, MD    Family History    Family History  Problem Relation Age of Onset  . Cancer Mother   . Healthy Brother   . Healthy Brother    He  indicated that his mother is deceased. He indicated that his father is deceased. He indicated that both of his brothers are alive. He indicated that his maternal grandmother is deceased. He indicated that his maternal grandfather is deceased. He indicated that his paternal grandmother is deceased. He indicated that his paternal grandfather is deceased.  Social History    Social History   Socioeconomic History  . Marital status: Married    Spouse name: Not on file  . Number of children: Not on file  . Years of education: Not on file  . Highest education level: Not on file  Occupational History  . Occupation: Chiropodist: [employer]  Tobacco Use  . Smoking status: Former Smoker    Quit date: 05/04/1986    Years since quitting: 33.6  . Smokeless tobacco: Never Used  Substance and Sexual Activity  . Alcohol use: Yes    Alcohol/week: 3.0 standard drinks  Types: 3 Shots of liquor per week    Comment: daily  . Drug use: Yes    Types: Marijuana  . Sexual activity: Not on file  Other Topics Concern  . Not on file  Social History Narrative  . Not on file   Social Determinants of Health   Financial Resource Strain:   . Difficulty of Paying Living Expenses: Not on file  Food Insecurity:   . Worried About Programme researcher, broadcasting/film/video in the Last Year: Not on file  . Ran Out of Food in the Last Year: Not on file  Transportation Needs:   . Lack of Transportation (Medical): Not on file  . Lack of Transportation (Non-Medical): Not on file  Physical Activity:   . Days of Exercise per Week: Not on file  . Minutes of Exercise per Session: Not on file  Stress:   . Feeling of Stress : Not on file  Social Connections:   . Frequency of Communication with Friends and Family: Not on file  . Frequency of Social Gatherings with Friends and Family: Not on file  . Attends Religious Services: Not on file  . Active Member of Clubs or Organizations: Not on file  . Attends Banker  Meetings: Not on file  . Marital Status: Not on file  Intimate Partner Violence:   . Fear of Current or Ex-Partner: Not on file  . Emotionally Abused: Not on file  . Physically Abused: Not on file  . Sexually Abused: Not on file     Review of Systems    General:  No chills, fever, night sweats or weight changes.  Cardiovascular:  No chest pain, dyspnea on exertion, edema, orthopnea, palpitations, paroxysmal nocturnal dyspnea. Dermatological: No rash, lesions/masses Respiratory: No cough, dyspnea Urologic: No hematuria, dysuria Abdominal:   No nausea, vomiting, diarrhea, bright red blood per rectum, melena, or hematemesis Neurologic:  No visual changes, wkns, changes in mental status. All other systems reviewed and are otherwise negative except as noted above.  Physical Exam    VS:  BP (!) 142/72   Pulse 66   Ht 5\' 10"  (1.778 m)   Wt 213 lb (96.6 kg)   BMI 30.56 kg/m  , BMI Body mass index is 30.56 kg/m. GEN: Well nourished, well developed, in no acute distress. HEENT: normal. Neck: Supple, no JVD, carotid bruits, or masses. Cardiac: RRR, no murmurs, rubs, or gallops. No clubbing, cyanosis, edema.  Radials/DP/PT 2+ and equal bilaterally.  Respiratory:  Respirations regular and unlabored, clear to auscultation bilaterally. GI: Soft, nontender, nondistended, BS + x 4. MS: no deformity or atrophy. Skin: warm and dry, no rash. Neuro:  Strength and sensation are intact. Psych: Normal affect.  Accessory Clinical Findings    Recent Labs: No results found for requested labs within last 8760 hours.   Recent Lipid Panel    Component Value Date/Time   CHOL 151 02/16/2017 1146   TRIG 79 02/16/2017 1146   HDL 52 02/16/2017 1146   CHOLHDL 2.5 09/11/2015 1301   VLDL 11 09/11/2015 1301   LDLCALC 83 02/16/2017 1146   LDLDIRECT 181.4 11/16/2012 1419    ECG personally reviewed by me today-normal sinus rhythm septal infarct undetermined age 66 bpm- No acute changes  EKG  01/17/2018 Sinus rhythm with first-degree AV block  Echocardiogram 02/08/2017 Study Conclusions - Left ventricle: The cavity size was normal. There was mild concentric hypertrophy. Systolic function was normal. The estimated ejection fraction was in the range of 60% to 65%.  Wall motion was normal; there were no regional wall motion abnormalities. Left ventricular diastolic function parameters were normal. - Aortic valve: A mechanical prosthesis was present. Transvalvular velocity was within the normal range. There was no stenosis. There was mild regurgitation. Peak velocity (S): 256 cm/s. Slightly increased from 11/2013 at 2.1 cm/s. Mean gradient (S): 17 mm Hg. VTI ratio of LVOT to aortic valve: 0.33. Valve area (VTI): 1.04 cm^2. Valve area (Vmax): 0.86 cm^2. Valve area (Vmean): 0.78 cm^2. - Mitral valve: There was trivial regurgitation. - Right ventricle: The cavity size was normal. Wall thickness was normal. Systolic function was normal. - Tricuspid valve: There was trivial regurgitation. - Pulmonary arteries: PA peak pressure: 18 mm Hg (S). - Inferior vena cava: The vessel was normal in size. The respirophasic diameter changes were in the normal range (= 50%), consistent with normal central venous pressure.   Assessment & Plan   1. Paroxysmal atrial fibrillation-EKG today shows normal sinus rhythm septal infarct undetermined age 8 bpm.  No recent episodes of palpitations or rapid heart rate. Continue warfarin, metoprolol[facility] Heart healthy low-sodium diet-salty 6 given Increase physical activity as tolerated  Status post AVR-initially replaced due to bacterial endocarditis 1988 with redo with total aortic root replacement using mechanical valve-echocardiogram 10/18 showed normal functioning mechanical prosthetic valve.    Atypical chest pain-resolved.  No chest pain today or recent episodes of chest  discomfort.  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 Continue atorvastatin, metoprolol Heart healthy low-sodium diet-salty 6 given Increase physical activity as tolerated  Marijuana use- no longer smoking Congratulated on cessation.  Disposition: Follow-up with Dr. SwazilandJordan annually  Thomasene RippleJesse M. Kou Gucciardo NP-C    12/13/2019, 11:46 AM [facility] [address] Office [phone] Fax [phone]  Notice: This dictation was prepared with Dragon dictation along with smaller phrase technology. Any transcriptional errors that result from this process are unintentional and may not be corrected upon review.

## 2019-12-13 ENCOUNTER — Encounter: Payer: Self-pay | Admitting: General Practice

## 2019-12-13 ENCOUNTER — Other Ambulatory Visit: Payer: Self-pay

## 2019-12-13 ENCOUNTER — Ambulatory Visit (INDEPENDENT_AMBULATORY_CARE_PROVIDER_SITE_OTHER): Payer: Self-pay | Admitting: General Practice

## 2019-12-13 VITALS — BP 142/72 | HR 66 | Ht 70.0 in | Wt 213.0 lb

## 2019-12-13 DIAGNOSIS — I480 Paroxysmal atrial fibrillation: Secondary | ICD-10-CM

## 2019-12-13 DIAGNOSIS — F1290 Cannabis use, unspecified, uncomplicated: Secondary | ICD-10-CM

## 2019-12-13 DIAGNOSIS — R0782 Intercostal pain: Secondary | ICD-10-CM

## 2019-12-13 DIAGNOSIS — Z952 Presence of prosthetic heart valve: Secondary | ICD-10-CM

## 2019-12-13 MED ORDER — METOPROLOL TARTRATE 25 MG PO TABS: 12.5000 mg | ORAL_TABLET | Freq: Two times a day (BID) | ORAL | 3 refills | Status: DC

## 2019-12-13 NOTE — Patient Instructions (Signed)
Medication Instructions:  The current medical regimen is effective;  continue present plan and medications as directed. Please refer to the Current Medication list given to you today. *If you need a refill on your cardiac medications before your next appointment, please call your pharmacy*  Special Instructions PLEASE READ AND FOLLOW SALTY 6-ATTACHED  Follow-Up: Your next appointment:  12 month(s) Please call our office 2 months in advance to schedule this appointment In Person with You may see Peter Swaziland, MD Edd Fabian, FNP or one of the following Advanced Practice Providers on your designated Care Team:  [place], PA-C Micah Flesher, PA-C or [place], [place].   At [facility], you and your health needs are our priority.  As part of our continuing mission to provide you with exceptional heart care, we have created designated Provider Care Teams.  These Care Teams include your primary Cardiologist (physician) and Advanced Practice Providers (APPs -  Physician Assistants and Nurse Practitioners) who all work together to provide you with the care you need, when you need it.  We recommend signing up for the patient portal called "MyChart".  Sign up information is provided on this After Visit Summary.  MyChart is used to connect with patients for Virtual Visits (Telemedicine).  Patients are able to view lab/test results, encounter notes, upcoming appointments, etc.  Non-urgent messages can be sent to your provider as well.   To learn more about what you can do with MyChart, go to [email or web address].

## 2019-12-23 ENCOUNTER — Other Ambulatory Visit: Payer: Self-pay | Admitting: Cardiology

## 2020-01-02 ENCOUNTER — Other Ambulatory Visit: Payer: Self-pay

## 2020-01-02 ENCOUNTER — Ambulatory Visit (INDEPENDENT_AMBULATORY_CARE_PROVIDER_SITE_OTHER): Payer: Self-pay

## 2020-01-02 DIAGNOSIS — Z5181 Encounter for therapeutic drug level monitoring: Secondary | ICD-10-CM

## 2020-01-02 DIAGNOSIS — I480 Paroxysmal atrial fibrillation: Secondary | ICD-10-CM

## 2020-01-02 DIAGNOSIS — I359 Nonrheumatic aortic valve disorder, unspecified: Secondary | ICD-10-CM

## 2020-01-02 DIAGNOSIS — Z952 Presence of prosthetic heart valve: Secondary | ICD-10-CM

## 2020-01-02 LAB — POCT INR: INR: 3.7 — AB (ref 2.0–3.0)

## 2020-01-02 NOTE — Patient Instructions (Signed)
Description   Take 1/2 tablet today, then resume same dosage 1 tablet daily.  Recheck INR in 6 weeks. Call with any problems or new medications [phone]

## 2020-02-13 ENCOUNTER — Other Ambulatory Visit: Payer: Self-pay

## 2020-02-13 ENCOUNTER — Ambulatory Visit (INDEPENDENT_AMBULATORY_CARE_PROVIDER_SITE_OTHER): Payer: Self-pay | Admitting: *Deleted

## 2020-02-13 DIAGNOSIS — Z952 Presence of prosthetic heart valve: Secondary | ICD-10-CM

## 2020-02-13 DIAGNOSIS — I480 Paroxysmal atrial fibrillation: Secondary | ICD-10-CM

## 2020-02-13 DIAGNOSIS — I359 Nonrheumatic aortic valve disorder, unspecified: Secondary | ICD-10-CM

## 2020-02-13 LAB — POCT INR: INR: 3.9 — AB (ref 2.0–3.0)

## 2020-02-13 NOTE — Patient Instructions (Signed)
Description   Hold warfarin today, then start taking 1 tablet daily except for 1/2 a tablet on Thursdays. Recheck INR in 3 weeks. Call with any problems or new medications [phone]

## 2020-03-12 ENCOUNTER — Ambulatory Visit (INDEPENDENT_AMBULATORY_CARE_PROVIDER_SITE_OTHER): Payer: Self-pay | Admitting: *Deleted

## 2020-03-12 ENCOUNTER — Other Ambulatory Visit: Payer: Self-pay

## 2020-03-12 DIAGNOSIS — I480 Paroxysmal atrial fibrillation: Secondary | ICD-10-CM

## 2020-03-12 DIAGNOSIS — I359 Nonrheumatic aortic valve disorder, unspecified: Secondary | ICD-10-CM

## 2020-03-12 DIAGNOSIS — Z952 Presence of prosthetic heart valve: Secondary | ICD-10-CM

## 2020-03-12 LAB — POCT INR: INR: 4 — AB (ref 2.0–3.0)

## 2020-03-12 NOTE — Patient Instructions (Addendum)
Description   Hold warfarin today, continue taking 1 tablet daily except for 1/2 a tablet on Thursdays. Recheck INR in 4 weeks.  Call with any problems or new medications [phone]

## 2020-03-27 ENCOUNTER — Other Ambulatory Visit: Payer: Self-pay | Admitting: Cardiology

## 2020-04-09 ENCOUNTER — Other Ambulatory Visit: Payer: Self-pay

## 2020-04-09 ENCOUNTER — Ambulatory Visit (INDEPENDENT_AMBULATORY_CARE_PROVIDER_SITE_OTHER): Payer: Self-pay | Admitting: *Deleted

## 2020-04-09 DIAGNOSIS — I359 Nonrheumatic aortic valve disorder, unspecified: Secondary | ICD-10-CM

## 2020-04-09 DIAGNOSIS — Z952 Presence of prosthetic heart valve: Secondary | ICD-10-CM

## 2020-04-09 DIAGNOSIS — I480 Paroxysmal atrial fibrillation: Secondary | ICD-10-CM

## 2020-04-09 LAB — POCT INR: INR: 5 — AB (ref 2.0–3.0)

## 2020-04-09 NOTE — Patient Instructions (Addendum)
  Description   Hold warfarin today and tomorrow, then start taking 1 tablet daily except for 1/2 a tablet on Sunday and Thursdays. Recheck INR in 3 week per pt request.   Call with any problems or new medications [phone]

## 2020-04-30 ENCOUNTER — Encounter (INDEPENDENT_AMBULATORY_CARE_PROVIDER_SITE_OTHER): Payer: Self-pay

## 2020-04-30 ENCOUNTER — Other Ambulatory Visit: Payer: Self-pay

## 2020-04-30 ENCOUNTER — Ambulatory Visit (INDEPENDENT_AMBULATORY_CARE_PROVIDER_SITE_OTHER): Payer: Self-pay

## 2020-04-30 DIAGNOSIS — I480 Paroxysmal atrial fibrillation: Secondary | ICD-10-CM

## 2020-04-30 DIAGNOSIS — Z952 Presence of prosthetic heart valve: Secondary | ICD-10-CM

## 2020-04-30 DIAGNOSIS — Z5181 Encounter for therapeutic drug level monitoring: Secondary | ICD-10-CM

## 2020-04-30 DIAGNOSIS — I359 Nonrheumatic aortic valve disorder, unspecified: Secondary | ICD-10-CM

## 2020-04-30 LAB — POCT INR: INR: 2.9 (ref 2.0–3.0)

## 2020-04-30 NOTE — Patient Instructions (Signed)
Description   Continue on same dosage 1 tablet daily except for 1/2 a tablet on Sundays and Thursdays. Recheck INR in 4 week per pt request.   Call with any problems or new medications [phone]

## 2020-05-28 ENCOUNTER — Ambulatory Visit (INDEPENDENT_AMBULATORY_CARE_PROVIDER_SITE_OTHER): Payer: Self-pay | Admitting: *Deleted

## 2020-05-28 ENCOUNTER — Other Ambulatory Visit: Payer: Self-pay

## 2020-05-28 DIAGNOSIS — Z5181 Encounter for therapeutic drug level monitoring: Secondary | ICD-10-CM

## 2020-05-28 DIAGNOSIS — Z952 Presence of prosthetic heart valve: Secondary | ICD-10-CM

## 2020-05-28 DIAGNOSIS — I480 Paroxysmal atrial fibrillation: Secondary | ICD-10-CM

## 2020-05-28 DIAGNOSIS — I359 Nonrheumatic aortic valve disorder, unspecified: Secondary | ICD-10-CM

## 2020-05-28 LAB — POCT INR: INR: 1.4 — AB (ref 2.0–3.0)

## 2020-05-28 NOTE — Patient Instructions (Signed)
Description   Take 1 tablet today and 1.5 tablets tomorrow, then continue on same dosage 1 tablet daily except for 1/2 a tablet on Sundays and Thursdays. Recheck INR in 3 weeks per pt request.   Call with any problems or new medications [phone]

## 2020-06-03 ENCOUNTER — Telehealth: Payer: Self-pay

## 2020-06-03 NOTE — Telephone Encounter (Signed)
Received a medical restriction form from [employer].Form completed and signed by Dr.Jordan.Faxed to fax # [phone].

## 2020-06-18 ENCOUNTER — Ambulatory Visit (INDEPENDENT_AMBULATORY_CARE_PROVIDER_SITE_OTHER): Payer: Self-pay | Admitting: *Deleted

## 2020-06-18 ENCOUNTER — Other Ambulatory Visit: Payer: Self-pay

## 2020-06-18 DIAGNOSIS — I480 Paroxysmal atrial fibrillation: Secondary | ICD-10-CM

## 2020-06-18 DIAGNOSIS — Z5181 Encounter for therapeutic drug level monitoring: Secondary | ICD-10-CM

## 2020-06-18 DIAGNOSIS — I359 Nonrheumatic aortic valve disorder, unspecified: Secondary | ICD-10-CM

## 2020-06-18 DIAGNOSIS — Z952 Presence of prosthetic heart valve: Secondary | ICD-10-CM

## 2020-06-18 LAB — POCT INR: INR: 3 (ref 2.0–3.0)

## 2020-06-18 NOTE — Patient Instructions (Signed)
Description   Continue taking 1 tablet daily except for 1/2 tablet on Sundays and Thursdays. Recheck INR in 4 weeks. Call with any problems or new medications [phone]

## 2020-07-01 ENCOUNTER — Telehealth: Payer: Self-pay

## 2020-07-01 NOTE — Telephone Encounter (Signed)
Received medical restriction form from [employer].Form completed and Dr.Jordan signed.Form faxed to [employer] at fax # [phone].

## 2020-07-15 ENCOUNTER — Telehealth: Payer: Self-pay | Admitting: Cardiology

## 2020-07-15 NOTE — Telephone Encounter (Signed)
Attempted to call the pt but his line kept ringing unable to LM not sure if pt it still at the office. Will hold and call back this afternoon.

## 2020-07-15 NOTE — Telephone Encounter (Signed)
Patient was a walk in and wants to discuss the employment form that was completed by Dr. Swaziland

## 2020-07-16 ENCOUNTER — Other Ambulatory Visit: Payer: Self-pay

## 2020-07-16 ENCOUNTER — Ambulatory Visit (INDEPENDENT_AMBULATORY_CARE_PROVIDER_SITE_OTHER): Payer: Self-pay | Admitting: *Deleted

## 2020-07-16 DIAGNOSIS — I359 Nonrheumatic aortic valve disorder, unspecified: Secondary | ICD-10-CM

## 2020-07-16 DIAGNOSIS — Z952 Presence of prosthetic heart valve: Secondary | ICD-10-CM

## 2020-07-16 DIAGNOSIS — Z5181 Encounter for therapeutic drug level monitoring: Secondary | ICD-10-CM

## 2020-07-16 DIAGNOSIS — I480 Paroxysmal atrial fibrillation: Secondary | ICD-10-CM

## 2020-07-16 LAB — POCT INR: INR: 2.6 (ref 2.0–3.0)

## 2020-07-16 NOTE — Patient Instructions (Signed)
Description   Continue taking 1 tablet daily except for 1/2 tablet on Sundays and Thursdays. Recheck INR in 5 weeks. Call with any problems or new medications [phone]

## 2020-07-17 NOTE — Telephone Encounter (Signed)
Ok

## 2020-07-17 NOTE — Telephone Encounter (Signed)
Called patient back, he states that the employment forms that were filled out by Dr.Jordan he did not like- he states because he put on there that he was on coumadin and could not have lacerations/cuts and he was denied the job. I did advise with patient that he did have to place what medications and if there are bleeding risks, patient states he just wants to discuss with him about it because he feels it could have been worded differently. I advised I could send a message but patient states he is coming in on 04/13 and will discuss then and hung up the phone.   Will route to MD to make aware for upcoming appointment.

## 2020-07-19 ENCOUNTER — Other Ambulatory Visit: Payer: Self-pay | Admitting: Cardiology

## 2020-07-31 NOTE — Progress Notes (Signed)
Cardiology Office Note:    Date:  08/05/2020   ID:  [name] [name], DOB [birth year], MRN [id number]  PCP:  Asencion Gowda.August Saucer, MD  Cardiologist:  Novie Maggio [place], MD   Referring MD: Asencion Gowda.August Saucer, MD   Chief Complaint  Patient presents with  . Hyperlipidemia    History of Present Illness:    [name] [name] is a 65 y.o. 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 No obstructive disease by heart cath 2015. He requires SBE prophylaxis. He was last seen by Edd Fabian NP in August 2021 with atypical chest pain.  On follow up today he is doing OK. He was having chronic stomach pain but this resolved with stopping coffee. Admits he was not taking lipitor when he had recent lab work. Was prescribed Crestor but states it is much more expensive. He is eating a lot of ice cream and has gained weight. No chest pain or SOB. Has not smoked weed in 2 years.     Past Medical History:  Diagnosis Date  . Anxiety   . Ascending aortic aneurysm (HCC) 01/07/2014  . Asthma   . Chest pain, atypical   . Chronic anticoagulation   . Depression   . Hyperlipidemia   . Hypertension   . S/P AVR    for endocarditis    Past Surgical History:  Procedure Laterality Date  . AORTIC VALVE REPLACEMENT  1988   41mm St. Jude valve  . AORTIC VALVE REPLACEMENT N/A 01/23/2014   Procedure: REDO AORTIC VALVE REPLACEMENT (AVR) with 23 St. Jude Mechanical Aortic Valved Graft;  Surgeon: Kerin Perna, MD;  Location: [facility] OR;  Service: Open Heart Surgery;  Laterality: N/A;  . APPLICATION OF WOUND VAC N/A 02/22/2014   Procedure: APPLICATION OF WOUND VAC;  Surgeon: Kerin Perna, MD;  Location: [facility] OR;  Service: Vascular;  Laterality: N/A;  . BENTALL PROCEDURE N/A 01/23/2014   Procedure: BENTALL PROCEDURE;  Surgeon: Kerin Perna, MD;  Location: [facility] OR;  Service: Open Heart Surgery;   Laterality: N/A;  . CARDIAC CATHETERIZATION    . DOPPLER ECHOCARDIOGRAPHY  06/12/1996   EF 55-60%  . HAND SURGERY    . I & D EXTREMITY N/A 02/22/2014   Procedure: IRRIGATION AND DEBRIDEMENT EXTREMITY-CHEST;  Surgeon: Kerin Perna, MD;  Location: [facility] OR;  Service: Vascular;  Laterality: N/A;  PULSE LAVAGE  . INTRAOPERATIVE TRANSESOPHAGEAL ECHOCARDIOGRAM N/A 01/23/2014   Procedure: INTRAOPERATIVE TRANSESOPHAGEAL ECHOCARDIOGRAM;  Surgeon: Kerin Perna, MD;  Location: [facility] OR;  Service: Open Heart Surgery;  Laterality: N/A;  . LEFT HEART CATHETERIZATION WITH CORONARY ANGIOGRAM N/A 01/07/2014   Procedure: LEFT HEART CATHETERIZATION WITH CORONARY ANGIOGRAM;  Surgeon: Lennette Bihari, MD;  Location: [facility] CATH LAB;  Service: Cardiovascular;  Laterality: N/A;    Current Medications: Current Meds  Medication Sig  . ALPRAZolam (XANAX) 0.5 MG tablet Take 0.5 mg by mouth daily as needed for anxiety.   Marland Kitchen atorvastatin (LIPITOR) 80 MG tablet Take 1 tablet (80 mg total) by mouth daily.  . diphenhydramine-acetaminophen (TYLENOL PM) 25-500 MG TABS Take 1 tablet by mouth at bedtime as needed (sleep).  . Fluticasone Furoate 200 MCG/ACT AEPB Inhale 1 puff into the lungs as needed.  . metoprolol tartrate (LOPRESSOR) 25 MG tablet Take 0.5 tablets (12.5 mg total) by mouth 2 (two) times daily.  . polyethylene glycol (MIRALAX / GLYCOLAX) packet Take 17  g by mouth 2 (two) times daily.  Marland Kitchen warfarin (COUMADIN) 5 MG tablet TAKE 1 TABLET BY MOUTH ONCE DAILY OR  AS  DIRECTED  BY  COUMADIN  CLINIC  . [DISCONTINUED] atorvastatin (LIPITOR) 80 MG tablet Take 1 tablet by mouth once daily  . [DISCONTINUED] rosuvastatin (CRESTOR) 40 MG tablet Take 40 mg by mouth daily.     Allergies:   Patient has no known allergies.   Social History   Socioeconomic History  . Marital status: Married    Spouse name: Not on file  . Number of children: Not on file  . Years of education: Not on file  . Highest education level: Not on file   Occupational History  . Occupation: Chiropodist: [employer]  Tobacco Use  . Smoking status: Former Smoker    Quit date: 05/04/1986    Years since quitting: 34.2  . Smokeless tobacco: Never Used  Substance and Sexual Activity  . Alcohol use: Yes    Alcohol/week: 3.0 standard drinks    Types: 3 Shots of liquor per week    Comment: daily  . Drug use: Yes    Types: Marijuana  . Sexual activity: Not on file  Other Topics Concern  . Not on file  Social History Narrative  . Not on file   Social Determinants of Health   Financial Resource Strain: Not on file  Food Insecurity: Not on file  Transportation Needs: Not on file  Physical Activity: Not on file  Stress: Not on file  Social Connections: Not on file     Family History: The patient's family history includes Cancer in his mother; Healthy in his brother and brother.  ROS:   Please see the history of present illness.     All other systems reviewed and are negative.  EKGs/Labs/Other Studies Reviewed:    The following studies were reviewed today:  Echo 02/08/17: Study Conclusions - Left ventricle: The cavity size was normal. There was mild   concentric hypertrophy. Systolic function was normal. The   estimated ejection fraction was in the range of 60% to 65%. Wall   motion was normal; there were no regional wall motion   abnormalities. Left ventricular diastolic function parameters   were normal. - Aortic valve: A mechanical prosthesis was present. Transvalvular   velocity was within the normal range. There was no stenosis.   There was mild regurgitation. Peak velocity (S): 256 cm/s.   Slightly increased from 11/2013 at 2.1 cm/s. Mean gradient (S): 17   mm Hg. VTI ratio of LVOT to aortic valve: 0.33. Valve area (VTI):   1.04 cm^2. Valve area (Vmax): 0.86 cm^2. Valve area (Vmean): 0.78   cm^2. - Mitral valve: There was trivial regurgitation. - Right ventricle: The cavity size was normal. Wall thickness was    normal. Systolic function was normal. - Tricuspid valve: There was trivial regurgitation. - Pulmonary arteries: PA peak pressure: 18 mm Hg (S). - Inferior vena cava: The vessel was normal in size. The   respirophasic diameter changes were in the normal range (= 50%),   consistent with normal central venous pressure.   EKG:  EKG is not ordered today.    Recent Labs: No results found for requested labs within last 8760 hours.  Recent Lipid Panel    Component Value Date/Time   CHOL 151 02/16/2017 1146   TRIG 79 02/16/2017 1146   HDL 52 02/16/2017 1146   CHOLHDL 2.5 09/11/2015 1301   VLDL 11 09/11/2015  1301   LDLCALC 83 02/16/2017 1146   LDLDIRECT 181.4 11/16/2012 1419   Dated 07/29/20: cholesterol 252, triglycerides 244, HDL 39, LDL 167. Creatinine 1.02. CMET normal. PSA normal.  Physical Exam:    VS:  BP 130/83   Pulse 85   Ht 5\' 9"  (1.753 m)   Wt 224 lb 9.6 oz (101.9 kg)   SpO2 95%   BMI 33.17 kg/m     Wt Readings from Last 3 Encounters:  08/05/20 224 lb 9.6 oz (101.9 kg)  12/13/19 213 lb (96.6 kg)  01/17/18 192 lb 12.8 oz (87.5 kg)     GEN:  Well nourished, well developed in no acute distress HEENT: Normal NECK: No JVD; No carotid bruits CARDIAC: RRR, crisp valve click, no rubs, gallops,sternotomy scar RESPIRATORY:  Clear to auscultation without rales, wheezing or rhonchi  ABDOMEN: Soft, non-tender, non-distended MUSCULOSKELETAL:  No edema; No deformity  SKIN: Warm and dry NEUROLOGIC:  Alert and oriented x 3 PSYCHIATRIC:  Normal affect   ASSESSMENT:    1. S/P AVR (aortic valve replacement)   2. PAF (paroxysmal atrial fibrillation) (HCC)   3. Mixed hyperlipidemia    PLAN:    In order of problems listed above:  1. PAF (paroxysmal atrial fibrillation) (HCC) - maintaining NSR. On chronic Coumadin Appears to be in sinus today.  2. S/P AVR (aortic valve replacement)-redo with total aortic root replacement using a mechanical valve-conduit (St. Jude 23 mm aortic  valve and 25 mm Hemashield conduit, serial 01/19/18). S/P AVR, #23 mm St Jude prosthesis in 1988 for bacterial endocarditis Crisp valve click. Compliant on coumadin.   3. Marijuana use Reports he rarely uses in the last 2 years  4. Hyperlipidemia. Mixed. Will resume atorvastatin for cost. Follow up with Dr 1989 for labs. Needs to lose weight and restrict sugar intake for elevated triglycerides.   Follow up in one year     Medication Adjustments/Labs and Tests Ordered: Current medicines are reviewed at length with the patient today.  Concerns regarding medicines are outlined above.  No orders of the defined types were placed in this encounter.  Meds ordered this encounter  Medications  . atorvastatin (LIPITOR) 80 MG tablet    Sig: Take 1 tablet (80 mg total) by mouth daily.    Dispense:  90 tablet    Refill:  3    Signed, Yzabelle Calles Clovis Riley, MD  08/05/2020 8:59 AM    [facility]

## 2020-08-05 ENCOUNTER — Encounter: Payer: Self-pay | Admitting: Cardiology

## 2020-08-05 ENCOUNTER — Ambulatory Visit (INDEPENDENT_AMBULATORY_CARE_PROVIDER_SITE_OTHER): Payer: Self-pay | Admitting: Cardiology

## 2020-08-05 ENCOUNTER — Other Ambulatory Visit: Payer: Self-pay

## 2020-08-05 VITALS — BP 130/83 | HR 85 | Ht 69.0 in | Wt 224.6 lb

## 2020-08-05 DIAGNOSIS — E782 Mixed hyperlipidemia: Secondary | ICD-10-CM

## 2020-08-05 DIAGNOSIS — I480 Paroxysmal atrial fibrillation: Secondary | ICD-10-CM

## 2020-08-05 DIAGNOSIS — Z952 Presence of prosthetic heart valve: Secondary | ICD-10-CM

## 2020-08-05 MED ORDER — ATORVASTATIN CALCIUM 80 MG PO TABS: 80.0000 mg | ORAL_TABLET | Freq: Every day | ORAL | 3 refills | Status: DC

## 2020-08-20 ENCOUNTER — Ambulatory Visit (INDEPENDENT_AMBULATORY_CARE_PROVIDER_SITE_OTHER): Payer: Self-pay | Admitting: *Deleted

## 2020-08-20 ENCOUNTER — Other Ambulatory Visit: Payer: Self-pay

## 2020-08-20 DIAGNOSIS — I359 Nonrheumatic aortic valve disorder, unspecified: Secondary | ICD-10-CM

## 2020-08-20 DIAGNOSIS — I480 Paroxysmal atrial fibrillation: Secondary | ICD-10-CM

## 2020-08-20 DIAGNOSIS — Z952 Presence of prosthetic heart valve: Secondary | ICD-10-CM

## 2020-08-20 DIAGNOSIS — Z5181 Encounter for therapeutic drug level monitoring: Secondary | ICD-10-CM

## 2020-08-20 LAB — POCT INR: INR: 4.2 — AB (ref 2.0–3.0)

## 2020-08-20 NOTE — Patient Instructions (Signed)
Description   Do not take any Warfarin today then continue taking the dose you should be taking, which is, 1 tablet daily except for 1/2 tablet on Sundays and Thursdays. Recheck INR in 4 weeks. Call with any problems or new medications [phone]

## 2020-09-17 ENCOUNTER — Ambulatory Visit (INDEPENDENT_AMBULATORY_CARE_PROVIDER_SITE_OTHER): Payer: Self-pay

## 2020-09-17 ENCOUNTER — Other Ambulatory Visit: Payer: Self-pay

## 2020-09-17 DIAGNOSIS — I359 Nonrheumatic aortic valve disorder, unspecified: Secondary | ICD-10-CM

## 2020-09-17 DIAGNOSIS — Z952 Presence of prosthetic heart valve: Secondary | ICD-10-CM

## 2020-09-17 DIAGNOSIS — Z5181 Encounter for therapeutic drug level monitoring: Secondary | ICD-10-CM

## 2020-09-17 DIAGNOSIS — I480 Paroxysmal atrial fibrillation: Secondary | ICD-10-CM

## 2020-09-17 LAB — POCT INR: INR: 3.4 — AB (ref 2.0–3.0)

## 2020-09-17 NOTE — Patient Instructions (Signed)
-   continue taking the dose you should be taking, which is, 1 tablet daily except for 1/2 tablet on Sundays and Thursdays.  - Recheck INR in 5 weeks.  Call with any problems or new medications [phone]

## 2020-10-22 ENCOUNTER — Ambulatory Visit (INDEPENDENT_AMBULATORY_CARE_PROVIDER_SITE_OTHER): Payer: Self-pay

## 2020-10-22 ENCOUNTER — Other Ambulatory Visit: Payer: Self-pay

## 2020-10-22 ENCOUNTER — Encounter (INDEPENDENT_AMBULATORY_CARE_PROVIDER_SITE_OTHER): Payer: Self-pay

## 2020-10-22 DIAGNOSIS — Z952 Presence of prosthetic heart valve: Secondary | ICD-10-CM

## 2020-10-22 DIAGNOSIS — I480 Paroxysmal atrial fibrillation: Secondary | ICD-10-CM

## 2020-10-22 DIAGNOSIS — Z5181 Encounter for therapeutic drug level monitoring: Secondary | ICD-10-CM

## 2020-10-22 DIAGNOSIS — I359 Nonrheumatic aortic valve disorder, unspecified: Secondary | ICD-10-CM

## 2020-10-22 LAB — POCT INR: INR: 3.7 — AB (ref 2.0–3.0)

## 2020-10-22 NOTE — Patient Instructions (Signed)
Description   Skip today's dosage of Warfarin, then resume same dosage 1 tablet daily except for 1/2 tablet on Sundays and Thursdays.  Recheck INR in 5 weeks. Call with any problems or new medications [phone]

## 2020-10-31 ENCOUNTER — Other Ambulatory Visit: Payer: Self-pay | Admitting: Cardiology

## 2020-11-26 ENCOUNTER — Other Ambulatory Visit: Payer: Self-pay

## 2020-11-26 ENCOUNTER — Ambulatory Visit (INDEPENDENT_AMBULATORY_CARE_PROVIDER_SITE_OTHER): Payer: Self-pay

## 2020-11-26 DIAGNOSIS — I359 Nonrheumatic aortic valve disorder, unspecified: Secondary | ICD-10-CM

## 2020-11-26 DIAGNOSIS — I480 Paroxysmal atrial fibrillation: Secondary | ICD-10-CM

## 2020-11-26 DIAGNOSIS — Z952 Presence of prosthetic heart valve: Secondary | ICD-10-CM

## 2020-11-26 DIAGNOSIS — Z5181 Encounter for therapeutic drug level monitoring: Secondary | ICD-10-CM

## 2020-11-26 LAB — POCT INR: INR: 2.1 (ref 2.0–3.0)

## 2020-11-26 NOTE — Patient Instructions (Signed)
Description   Take 1 tablet tonight and then continue taking 1 tablet daily except for 1/2 tablet on Sundays and Thursdays.  Recheck INR in 4 weeks. Call with any problems or new medications [phone]

## 2020-12-21 ENCOUNTER — Other Ambulatory Visit: Payer: Self-pay | Admitting: General Practice

## 2020-12-21 NOTE — Telephone Encounter (Signed)
*  STAT* If patient is at the pharmacy, call can be transferred to refill team.   1. Which medications need to be refilled? (please lislt name of each medication and dose if known)  Metoprolol 2. Which pharmacy/location (including street and city if local pharmacy) is medication to be sent to? [facility] [place] [address], [place]  ed a 30 day or 90 day supply? 90 days and refills

## 2020-12-22 ENCOUNTER — Other Ambulatory Visit: Payer: Self-pay

## 2020-12-24 ENCOUNTER — Other Ambulatory Visit: Payer: Self-pay

## 2020-12-24 ENCOUNTER — Ambulatory Visit (INDEPENDENT_AMBULATORY_CARE_PROVIDER_SITE_OTHER): Payer: Self-pay

## 2020-12-24 DIAGNOSIS — Z5181 Encounter for therapeutic drug level monitoring: Secondary | ICD-10-CM

## 2020-12-24 DIAGNOSIS — Z952 Presence of prosthetic heart valve: Secondary | ICD-10-CM

## 2020-12-24 DIAGNOSIS — I359 Nonrheumatic aortic valve disorder, unspecified: Secondary | ICD-10-CM

## 2020-12-24 DIAGNOSIS — I480 Paroxysmal atrial fibrillation: Secondary | ICD-10-CM

## 2020-12-24 LAB — POCT INR: INR: 1.9 — AB (ref 2.0–3.0)

## 2020-12-24 NOTE — Patient Instructions (Signed)
Description   Take 1.5 tablets tonight and then START taking 1 tablet daily except for 1/2 tablet on Sundays.  Recheck INR in 4 weeks. Call with any problems or new medications [phone]

## 2021-01-21 ENCOUNTER — Ambulatory Visit (INDEPENDENT_AMBULATORY_CARE_PROVIDER_SITE_OTHER): Payer: Self-pay | Admitting: *Deleted

## 2021-01-21 ENCOUNTER — Other Ambulatory Visit: Payer: Self-pay

## 2021-01-21 DIAGNOSIS — I359 Nonrheumatic aortic valve disorder, unspecified: Secondary | ICD-10-CM

## 2021-01-21 DIAGNOSIS — Z952 Presence of prosthetic heart valve: Secondary | ICD-10-CM

## 2021-01-21 DIAGNOSIS — Z5181 Encounter for therapeutic drug level monitoring: Secondary | ICD-10-CM

## 2021-01-21 DIAGNOSIS — I480 Paroxysmal atrial fibrillation: Secondary | ICD-10-CM

## 2021-01-21 LAB — POCT INR: INR: 1.6 — AB (ref 2.0–3.0)

## 2021-01-21 NOTE — Patient Instructions (Signed)
Description   Take 1.5 tablets tonight and 1.5 tablets tomorrow then START taking 1 tablet daily. Recheck INR in 4 weeks (per pt request). Call with any problems or new medications [phone]

## 2021-02-08 ENCOUNTER — Other Ambulatory Visit: Payer: Self-pay | Admitting: Cardiology

## 2021-02-18 ENCOUNTER — Other Ambulatory Visit: Payer: Self-pay

## 2021-02-18 ENCOUNTER — Ambulatory Visit (INDEPENDENT_AMBULATORY_CARE_PROVIDER_SITE_OTHER): Payer: Self-pay | Admitting: *Deleted

## 2021-02-18 DIAGNOSIS — I359 Nonrheumatic aortic valve disorder, unspecified: Secondary | ICD-10-CM

## 2021-02-18 DIAGNOSIS — Z5181 Encounter for therapeutic drug level monitoring: Secondary | ICD-10-CM

## 2021-02-18 DIAGNOSIS — Z952 Presence of prosthetic heart valve: Secondary | ICD-10-CM

## 2021-02-18 DIAGNOSIS — I480 Paroxysmal atrial fibrillation: Secondary | ICD-10-CM

## 2021-02-18 LAB — POCT INR: INR: 2.5 (ref 2.0–3.0)

## 2021-03-17 ENCOUNTER — Other Ambulatory Visit: Payer: Self-pay

## 2021-03-17 ENCOUNTER — Ambulatory Visit (INDEPENDENT_AMBULATORY_CARE_PROVIDER_SITE_OTHER): Payer: Self-pay

## 2021-03-17 DIAGNOSIS — Z5181 Encounter for therapeutic drug level monitoring: Secondary | ICD-10-CM

## 2021-03-17 DIAGNOSIS — I480 Paroxysmal atrial fibrillation: Secondary | ICD-10-CM

## 2021-03-17 DIAGNOSIS — Z952 Presence of prosthetic heart valve: Secondary | ICD-10-CM

## 2021-03-17 DIAGNOSIS — I359 Nonrheumatic aortic valve disorder, unspecified: Secondary | ICD-10-CM

## 2021-03-17 LAB — POCT INR: INR: 2.3 (ref 2.0–3.0)

## 2021-03-17 NOTE — Patient Instructions (Signed)
Description   Take 2 tablets today and then Start taking 1 tablet daily except 1.5 tablets on Thursdays. Recheck INR in 2 weeks (per pt request). Call with any problems or new medications [phone]

## 2021-04-07 ENCOUNTER — Other Ambulatory Visit: Payer: Self-pay | Admitting: Cardiology

## 2021-04-15 ENCOUNTER — Ambulatory Visit (INDEPENDENT_AMBULATORY_CARE_PROVIDER_SITE_OTHER): Payer: Self-pay

## 2021-04-15 ENCOUNTER — Other Ambulatory Visit: Payer: Self-pay

## 2021-04-15 DIAGNOSIS — I359 Nonrheumatic aortic valve disorder, unspecified: Secondary | ICD-10-CM

## 2021-04-15 DIAGNOSIS — Z952 Presence of prosthetic heart valve: Secondary | ICD-10-CM

## 2021-04-15 DIAGNOSIS — I480 Paroxysmal atrial fibrillation: Secondary | ICD-10-CM

## 2021-04-15 DIAGNOSIS — Z5181 Encounter for therapeutic drug level monitoring: Secondary | ICD-10-CM

## 2021-04-15 LAB — POCT INR: INR: 2.5 (ref 2.0–3.0)

## 2021-04-15 NOTE — Patient Instructions (Signed)
Description   Take 2 tablets today and then continue taking 1 tablet daily except 1.5 tablets on Thursdays. Recheck INR in 4 weeks. Call with any problems or new medications [phone]

## 2021-05-03 ENCOUNTER — Other Ambulatory Visit: Payer: Self-pay | Admitting: Cardiology

## 2021-05-13 ENCOUNTER — Other Ambulatory Visit: Payer: Self-pay

## 2021-05-13 ENCOUNTER — Ambulatory Visit (INDEPENDENT_AMBULATORY_CARE_PROVIDER_SITE_OTHER): Payer: Self-pay | Admitting: *Deleted

## 2021-05-13 DIAGNOSIS — Z5181 Encounter for therapeutic drug level monitoring: Secondary | ICD-10-CM

## 2021-05-13 DIAGNOSIS — I480 Paroxysmal atrial fibrillation: Secondary | ICD-10-CM

## 2021-05-13 DIAGNOSIS — Z952 Presence of prosthetic heart valve: Secondary | ICD-10-CM

## 2021-05-13 DIAGNOSIS — I359 Nonrheumatic aortic valve disorder, unspecified: Secondary | ICD-10-CM

## 2021-05-13 LAB — POCT INR: INR: 4.2 — AB (ref 2.0–3.0)

## 2021-05-13 NOTE — Patient Instructions (Signed)
Description   Do not take any Warfarin today then continue taking 1 tablet daily except 1.5 tablets on Thursdays. Recheck INR in 4 weeks. Call with any problems or new medications [phone]

## 2021-06-10 ENCOUNTER — Ambulatory Visit (INDEPENDENT_AMBULATORY_CARE_PROVIDER_SITE_OTHER): Payer: Self-pay

## 2021-06-10 ENCOUNTER — Other Ambulatory Visit: Payer: Self-pay

## 2021-06-10 DIAGNOSIS — I480 Paroxysmal atrial fibrillation: Secondary | ICD-10-CM

## 2021-06-10 DIAGNOSIS — Z952 Presence of prosthetic heart valve: Secondary | ICD-10-CM

## 2021-06-10 DIAGNOSIS — I359 Nonrheumatic aortic valve disorder, unspecified: Secondary | ICD-10-CM

## 2021-06-10 DIAGNOSIS — Z5181 Encounter for therapeutic drug level monitoring: Secondary | ICD-10-CM

## 2021-06-10 LAB — POCT INR: INR: 2.2 (ref 2.0–3.0)

## 2021-06-10 NOTE — Patient Instructions (Signed)
Description   Take 2 tablets of Warfarin today, then resume same dosage 1 tablet daily except 1.5 tablets on Thursdays. Recheck INR in 4 weeks. Call with any problems or new medications [phone]

## 2021-07-12 ENCOUNTER — Encounter: Payer: Self-pay | Admitting: *Deleted

## 2021-07-12 ENCOUNTER — Other Ambulatory Visit: Payer: Self-pay

## 2021-07-12 ENCOUNTER — Ambulatory Visit (INDEPENDENT_AMBULATORY_CARE_PROVIDER_SITE_OTHER): Payer: BC Managed Care – PPO | Admitting: *Deleted

## 2021-07-12 DIAGNOSIS — I359 Nonrheumatic aortic valve disorder, unspecified: Secondary | ICD-10-CM | POA: Diagnosis not present

## 2021-07-12 DIAGNOSIS — I480 Paroxysmal atrial fibrillation: Secondary | ICD-10-CM | POA: Diagnosis not present

## 2021-07-12 DIAGNOSIS — Z952 Presence of prosthetic heart valve: Secondary | ICD-10-CM

## 2021-07-12 DIAGNOSIS — Z5181 Encounter for therapeutic drug level monitoring: Secondary | ICD-10-CM

## 2021-07-12 LAB — POCT INR: INR: 3.4 — AB (ref 2.0–3.0)

## 2021-07-12 NOTE — Patient Instructions (Signed)
Description   ?Continue taking Warfarin 1 tablet daily except 1.5 tablets on Thursdays. Recheck INR in 5 weeks. Call with any problems or new medications [phone] ? ?  ?  ?

## 2021-08-04 ENCOUNTER — Other Ambulatory Visit: Payer: Self-pay | Admitting: Cardiology

## 2021-08-06 DIAGNOSIS — R109 Unspecified abdominal pain: Secondary | ICD-10-CM | POA: Diagnosis not present

## 2021-08-06 DIAGNOSIS — E7800 Pure hypercholesterolemia, unspecified: Secondary | ICD-10-CM | POA: Diagnosis not present

## 2021-08-06 DIAGNOSIS — F419 Anxiety disorder, unspecified: Secondary | ICD-10-CM | POA: Diagnosis not present

## 2021-08-06 DIAGNOSIS — Z125 Encounter for screening for malignant neoplasm of prostate: Secondary | ICD-10-CM | POA: Diagnosis not present

## 2021-08-06 DIAGNOSIS — J4520 Mild intermittent asthma, uncomplicated: Secondary | ICD-10-CM | POA: Diagnosis not present

## 2021-08-16 ENCOUNTER — Ambulatory Visit (INDEPENDENT_AMBULATORY_CARE_PROVIDER_SITE_OTHER): Payer: BC Managed Care – PPO | Admitting: *Deleted

## 2021-08-16 DIAGNOSIS — Z5181 Encounter for therapeutic drug level monitoring: Secondary | ICD-10-CM | POA: Diagnosis not present

## 2021-08-16 DIAGNOSIS — I359 Nonrheumatic aortic valve disorder, unspecified: Secondary | ICD-10-CM

## 2021-08-16 DIAGNOSIS — Z952 Presence of prosthetic heart valve: Secondary | ICD-10-CM | POA: Diagnosis not present

## 2021-08-16 DIAGNOSIS — I480 Paroxysmal atrial fibrillation: Secondary | ICD-10-CM

## 2021-08-16 LAB — POCT INR: INR: 2.1 (ref 2.0–3.0)

## 2021-08-16 NOTE — Patient Instructions (Signed)
Description   ?Today take 1.5 tablets then continue taking Warfarin 1 tablet daily except 1.5 tablets on Thursdays. Recheck INR in 4 weeks. Call with any problems or new medications [phone] ? ?  ?  ?

## 2021-09-21 ENCOUNTER — Ambulatory Visit (INDEPENDENT_AMBULATORY_CARE_PROVIDER_SITE_OTHER): Payer: BC Managed Care – PPO | Admitting: *Deleted

## 2021-09-21 DIAGNOSIS — Z952 Presence of prosthetic heart valve: Secondary | ICD-10-CM

## 2021-09-21 DIAGNOSIS — I359 Nonrheumatic aortic valve disorder, unspecified: Secondary | ICD-10-CM | POA: Diagnosis not present

## 2021-09-21 DIAGNOSIS — I480 Paroxysmal atrial fibrillation: Secondary | ICD-10-CM | POA: Diagnosis not present

## 2021-09-21 DIAGNOSIS — Z5181 Encounter for therapeutic drug level monitoring: Secondary | ICD-10-CM

## 2021-09-21 LAB — POCT INR: INR: 4 — AB (ref 2.0–3.0)

## 2021-09-21 NOTE — Patient Instructions (Signed)
Description   Hold warfarin today and then continue taking Warfarin 1 tablet daily except 1.5 tablets on Thursdays. Recheck INR in 4 weeks- pt request. Call with any problems or new medications [phone]

## 2021-10-05 ENCOUNTER — Other Ambulatory Visit: Payer: Self-pay | Admitting: Physician Assistant

## 2021-10-05 ENCOUNTER — Ambulatory Visit
Admission: RE | Admit: 2021-10-05 | Discharge: 2021-10-05 | Disposition: A | Discharge status: Home / Self Care | Payer: BC Managed Care – PPO | Source: Ambulatory Visit | Attending: Physician Assistant | Admitting: Physician Assistant

## 2021-10-05 DIAGNOSIS — I700 Atherosclerosis of aorta: Secondary | ICD-10-CM | POA: Diagnosis not present

## 2021-10-05 DIAGNOSIS — R911 Solitary pulmonary nodule: Secondary | ICD-10-CM

## 2021-10-05 DIAGNOSIS — R109 Unspecified abdominal pain: Secondary | ICD-10-CM

## 2021-10-05 DIAGNOSIS — K5790 Diverticulosis of intestine, part unspecified, without perforation or abscess without bleeding: Secondary | ICD-10-CM | POA: Diagnosis not present

## 2021-10-05 DIAGNOSIS — R634 Abnormal weight loss: Secondary | ICD-10-CM | POA: Diagnosis not present

## 2021-10-05 DIAGNOSIS — J432 Centrilobular emphysema: Secondary | ICD-10-CM | POA: Diagnosis not present

## 2021-10-05 DIAGNOSIS — K429 Umbilical hernia without obstruction or gangrene: Secondary | ICD-10-CM | POA: Diagnosis not present

## 2021-10-05 DIAGNOSIS — K589 Irritable bowel syndrome without diarrhea: Secondary | ICD-10-CM | POA: Diagnosis not present

## 2021-10-05 DIAGNOSIS — K5900 Constipation, unspecified: Secondary | ICD-10-CM | POA: Diagnosis not present

## 2021-10-05 DIAGNOSIS — K4090 Unilateral inguinal hernia, without obstruction or gangrene, not specified as recurrent: Secondary | ICD-10-CM | POA: Diagnosis not present

## 2021-10-05 DIAGNOSIS — Z7901 Long term (current) use of anticoagulants: Secondary | ICD-10-CM | POA: Diagnosis not present

## 2021-10-05 DIAGNOSIS — D171 Benign lipomatous neoplasm of skin and subcutaneous tissue of trunk: Secondary | ICD-10-CM | POA: Diagnosis not present

## 2021-10-05 MED ORDER — IOPAMIDOL (ISOVUE-300) INJECTION 61%
100.0000 mL | Freq: Once | INTRAVENOUS | Status: AC | PRN
  Administered 2021-10-05: 100 mL via INTRAVENOUS

## 2021-10-08 ENCOUNTER — Other Ambulatory Visit: Payer: Self-pay | Admitting: Cardiology

## 2021-10-11 ENCOUNTER — Other Ambulatory Visit: Payer: Self-pay | Admitting: Physician Assistant

## 2021-10-11 DIAGNOSIS — N289 Disorder of kidney and ureter, unspecified: Secondary | ICD-10-CM

## 2021-10-18 ENCOUNTER — Telehealth: Payer: Self-pay | Admitting: Cardiology

## 2021-10-18 DIAGNOSIS — Z952 Presence of prosthetic heart valve: Secondary | ICD-10-CM

## 2021-10-18 NOTE — Telephone Encounter (Signed)
Pt states that he was told by his gastroenterologist that he needs to be see his cardiologist. He stated that he feels fine and would like extra clarification. Would like to speak to Anabel Halon, if possible.

## 2021-10-18 NOTE — Telephone Encounter (Signed)
Called pt to discuss his message. He states he just had a CT and was told he needs to call Dr. Swaziland. Pt state "I feel fine, my blood pressure is fine, I work 40 hours a week. I don't know what is going on."

## 2021-10-18 NOTE — Telephone Encounter (Signed)
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 He had prior redo AVR and Bentall procedure by Dr Donata Clay in 2015. I think it would be appropriate to have him seen by CT surgery for this finding.   Kristapher Dubuque Swaziland MD, [facility]

## 2021-10-21 ENCOUNTER — Ambulatory Visit (INDEPENDENT_AMBULATORY_CARE_PROVIDER_SITE_OTHER): Payer: BC Managed Care – PPO

## 2021-10-21 DIAGNOSIS — Z5181 Encounter for therapeutic drug level monitoring: Secondary | ICD-10-CM

## 2021-10-21 DIAGNOSIS — I359 Nonrheumatic aortic valve disorder, unspecified: Secondary | ICD-10-CM

## 2021-10-21 DIAGNOSIS — Z952 Presence of prosthetic heart valve: Secondary | ICD-10-CM | POA: Diagnosis not present

## 2021-10-21 DIAGNOSIS — I480 Paroxysmal atrial fibrillation: Secondary | ICD-10-CM | POA: Diagnosis not present

## 2021-10-21 LAB — POCT INR: INR: 5.1 — AB (ref 2.0–3.0)

## 2021-10-21 NOTE — Patient Instructions (Signed)
Description   Hold today's dose and only take 0.5 tablet tomorrow and then START taking 1 tablet daily. Recheck INR in 2 weeks.   Call with any problems or new medications [phone]

## 2021-10-29 ENCOUNTER — Other Ambulatory Visit: Payer: Self-pay | Admitting: Cardiology

## 2021-10-29 NOTE — Telephone Encounter (Signed)
Received refill request for warfarin:  Last INR 5.1 on 10/21/21 Next INR due 11/08/21 LOV was 4/13 22  P Swaziland MD  (Past Due) Message sent to schedulers to make appt.  Refill approved x 2

## 2021-11-02 NOTE — Telephone Encounter (Signed)
Called patient left message on personal voice mail after reviewing your chart you are past due to see Dr.Jordan.Advised to call back and schedule follow up appointment.

## 2021-11-02 NOTE — Telephone Encounter (Signed)
Patient has appointment with Dr.Van Maudie Flakes 8/7 at 2:30 pm.

## 2021-11-08 ENCOUNTER — Ambulatory Visit (INDEPENDENT_AMBULATORY_CARE_PROVIDER_SITE_OTHER): Payer: BC Managed Care – PPO

## 2021-11-08 DIAGNOSIS — Z5181 Encounter for therapeutic drug level monitoring: Secondary | ICD-10-CM

## 2021-11-08 DIAGNOSIS — Z952 Presence of prosthetic heart valve: Secondary | ICD-10-CM | POA: Diagnosis not present

## 2021-11-08 DIAGNOSIS — I480 Paroxysmal atrial fibrillation: Secondary | ICD-10-CM | POA: Diagnosis not present

## 2021-11-08 DIAGNOSIS — I359 Nonrheumatic aortic valve disorder, unspecified: Secondary | ICD-10-CM | POA: Diagnosis not present

## 2021-11-08 LAB — POCT INR: INR: 3.4 — AB (ref 2.0–3.0)

## 2021-11-08 NOTE — Patient Instructions (Signed)
Continue taking 1 tablet daily. Recheck INR in 5 weeks.   Call with any problems or new medications [phone]

## 2021-11-09 DIAGNOSIS — K5900 Constipation, unspecified: Secondary | ICD-10-CM | POA: Diagnosis not present

## 2021-11-09 DIAGNOSIS — N289 Disorder of kidney and ureter, unspecified: Secondary | ICD-10-CM | POA: Diagnosis not present

## 2021-11-09 DIAGNOSIS — I7100 Dissection of unspecified site of aorta: Secondary | ICD-10-CM | POA: Diagnosis not present

## 2021-11-09 DIAGNOSIS — K589 Irritable bowel syndrome without diarrhea: Secondary | ICD-10-CM | POA: Diagnosis not present

## 2021-11-21 ENCOUNTER — Other Ambulatory Visit: Payer: Self-pay | Admitting: Cardiology

## 2021-11-29 ENCOUNTER — Encounter: Payer: BC Managed Care – PPO | Admitting: Cardiothoracic Surgery

## 2021-12-03 ENCOUNTER — Encounter: Payer: Self-pay | Admitting: Cardiothoracic Surgery

## 2021-12-03 ENCOUNTER — Institutional Professional Consult (permissible substitution): Payer: BC Managed Care – PPO | Admitting: Cardiothoracic Surgery

## 2021-12-03 VITALS — BP 122/70 | HR 66 | Resp 18 | Ht 69.0 in | Wt 162.0 lb

## 2021-12-03 DIAGNOSIS — I7121 Aneurysm of the ascending aorta, without rupture: Secondary | ICD-10-CM

## 2021-12-03 DIAGNOSIS — M314 Aortic arch syndrome [Takayasu]: Secondary | ICD-10-CM

## 2021-12-03 NOTE — Progress Notes (Signed)
HPI: The patient presents for surgical follow-up 8 years after a Bentall procedure with a mechanical Saint Jude valve-conduit root replacement.  25 years prior to that the patient underwent a AVR with mechanical valve.  The patient is now followed by cardiology.  He has atrial fibrillation but is already on Coumadin.  He has no symptoms of angina or CHF.  His blood pressure is well controlled and he lives a heart healthy lifestyle.  The patient developed abdominal pain and as part of the evaluation today CTA of the chest abdomen pelvis was performed.  The patient's aortic graft is intact but as an incidental finding there is a small cleft in the posterior suture line which was felt to be possible dissection but is probably a pledgeted suture.  The patient was reassured that the aortic graft is intact and does not need further surgery.  The patient's last echo was about 5 years ago showing good valvular function.  Normal biventricular function.    Current Outpatient Medications  Medication Sig Dispense Refill   ALPRAZolam (XANAX) 0.5 MG tablet Take 0.5 mg by mouth daily as needed for anxiety.      atorvastatin (LIPITOR) 80 MG tablet Take 1 tablet by mouth once daily 90 tablet 0   dicyclomine (BENTYL) 10 MG capsule Take by mouth.     diphenhydramine-acetaminophen (TYLENOL PM) 25-500 MG TABS Take 1 tablet by mouth at bedtime as needed (sleep).     Fluticasone Furoate 200 MCG/ACT AEPB Inhale 1 puff into the lungs as needed.     metoprolol tartrate (LOPRESSOR) 25 MG tablet Take 1/2 (one-half) tablet by mouth twice daily 90 tablet 0   polyethylene glycol (MIRALAX / GLYCOLAX) packet Take 17 g by mouth 2 (two) times daily. 24 each 0   warfarin (COUMADIN) 5 MG tablet TAKE 1 TABLET BY MOUTH ONCE DAILY OR AS DIRECTED BY COUMADIN CLINIC 90 tablet 0   No current facility-administered medications for this visit.     Physical Exam: Blood pressure 122/70, pulse 66, resp. rate 18, height 5\' 9"  (1.753 m),  weight 162 lb (73.5 kg), SpO2 94 %.         Exam    General- alert and comfortable    Neck- no JVD, no cervical adenopathy palpable, no carotid bruit   Lungs- clear without rales, wheezes   Cor- regular rate and rhythm, no murmur , gallop.  Normal mechanical aortic valve click without diastolic murmur.   Abdomen- soft, non-tender   Extremities - warm, non-tender, minimal edema   Neuro- oriented, appropriate, no focal weakness  Diagnostic Tests: CT scan images personally reviewed and discussed with patient.  Images also demonstrated to the patient showing a small cleft in the suture line which is not likely due to a dissection.  Impression: Patient doing well 8 years postop mechanical valve-conduit Bentall procedure.  Plan: Continue current meds and heart healthy lifestyle and diet.  I will arrange for a annual surveillance CTA of his aortic repair starting June 2024.   July 2024, MD [facility] [phone]

## 2021-12-13 ENCOUNTER — Ambulatory Visit (INDEPENDENT_AMBULATORY_CARE_PROVIDER_SITE_OTHER): Payer: BC Managed Care – PPO

## 2021-12-13 DIAGNOSIS — I480 Paroxysmal atrial fibrillation: Secondary | ICD-10-CM

## 2021-12-13 DIAGNOSIS — Z952 Presence of prosthetic heart valve: Secondary | ICD-10-CM | POA: Diagnosis not present

## 2021-12-13 DIAGNOSIS — Z5181 Encounter for therapeutic drug level monitoring: Secondary | ICD-10-CM

## 2021-12-13 DIAGNOSIS — I359 Nonrheumatic aortic valve disorder, unspecified: Secondary | ICD-10-CM

## 2021-12-13 LAB — POCT INR: INR: 3.6 — AB (ref 2.0–3.0)

## 2021-12-13 NOTE — Patient Instructions (Signed)
Description   Only take 0.5 tablet today and then continue taking 1 tablet daily. Recheck INR in 5 weeks.   Call with any problems or new medications [phone]

## 2021-12-23 ENCOUNTER — Ambulatory Visit: Payer: BC Managed Care – PPO | Admitting: General Practice

## 2022-01-03 ENCOUNTER — Other Ambulatory Visit: Payer: Self-pay | Admitting: Cardiology

## 2022-01-05 ENCOUNTER — Other Ambulatory Visit: Payer: Self-pay | Admitting: Physician Assistant

## 2022-01-05 DIAGNOSIS — Z952 Presence of prosthetic heart valve: Secondary | ICD-10-CM

## 2022-01-05 DIAGNOSIS — R9389 Abnormal findings on diagnostic imaging of other specified body structures: Secondary | ICD-10-CM

## 2022-01-05 DIAGNOSIS — R911 Solitary pulmonary nodule: Secondary | ICD-10-CM

## 2022-01-17 ENCOUNTER — Ambulatory Visit: Payer: Self-pay | Attending: Cardiology

## 2022-01-17 DIAGNOSIS — Z952 Presence of prosthetic heart valve: Secondary | ICD-10-CM

## 2022-01-17 DIAGNOSIS — Z5181 Encounter for therapeutic drug level monitoring: Secondary | ICD-10-CM

## 2022-01-17 DIAGNOSIS — I480 Paroxysmal atrial fibrillation: Secondary | ICD-10-CM

## 2022-01-17 DIAGNOSIS — I359 Nonrheumatic aortic valve disorder, unspecified: Secondary | ICD-10-CM

## 2022-01-17 LAB — POCT INR: INR: 3 (ref 2.0–3.0)

## 2022-01-17 NOTE — Patient Instructions (Addendum)
Description   Continue taking 1 tablet daily. Recheck INR in 6 weeks.   Call with any problems or new medications [phone]

## 2022-01-18 ENCOUNTER — Telehealth: Payer: Self-pay | Admitting: Licensed Clinical Social Worker

## 2022-01-18 NOTE — Telephone Encounter (Signed)
H&V Care Navigation CSW Progress Note  Clinical Social Worker contacted patient by phone to f/u after coumadin appt on 9/25. Note that pt is 46 and should have Medicare benefits but none listed and no card scanned in. Was unable to reach pt at [phone], left message requesting call back as able. May need to connect with county [facility] counselors if hasnt enrolled in benefits. Will re-attempt as able.   Patient is participating in a Managed Medicaid Plan:  No, no coverage listed.   SDOH Screenings   Tobacco Use: Medium Risk (12/03/2021)    Westley Hummer, MSW, [place]  [phone]- work cell phone (preferred) [phone]- desk phone

## 2022-01-19 ENCOUNTER — Telehealth: Payer: Self-pay | Admitting: Licensed Clinical Social Worker

## 2022-01-19 NOTE — Progress Notes (Signed)
Heart and Vascular Care Navigation  01/19/2022  [name] [name] 10/08/1955 [id number]  Reason for Referral:  Patient is participating in a Managed Medicaid Plan: No, Medicare Part A only?  Engaged with patient by telephone for initial visit for Heart and Vascular Care Coordination.                                                                                                   Assessment:       LCSW has received call back from pt [phone]). Introduced self, role, reason for call. Pt confirmed home address, PCP, and shares that he has Medicare pt A but had insurance through his work therefore didn't enroll in Pt B. He believes his Part B coverage will start October 1st and he is working on enrolling in a Pt D plan. LCSW offered [facility] counselors # and explained their roles. Pt declines states he has it sorted/lots on his plate. He denies any additional cost of living concerns.   He is aware of upcoming appt with Verdon Cummins. He requests I let them know about nodule visualized on CT that he didn't mention to Dr. Maren Beach. I will forward this on to [facility] and add to appt notes for recommendations.                                 HRT/VAS Care Coordination     Patients Home Cardiology Office [facility]   Outpatient Care Team Social Worker   Social Worker Name: Nile Riggs, [place] [facility] [phone]   Living arrangements for the past 2 months Single Family Home   Lives with: Self   Patient Current Insurance Coverage Traditional Medicare; Self-Pay  Medicare Part A only   Patient Has Concern With Paying Medical Bills No   Does Patient Have Prescription Coverage? No   Home Assistive Devices/Equipment Eyeglasses       Social History:                                                                             SDOH Screenings   Food Insecurity: No Food Insecurity (01/19/2022)  Housing: Low Risk  (01/19/2022)  Transportation Needs: No Transportation Needs (01/19/2022)   Utilities: Not At Risk (01/19/2022)  Financial Resource Strain: Low Risk  (01/19/2022)  Tobacco Use: Medium Risk (12/03/2021)    SDOH Interventions: Financial Resources:  Financial Strain Interventions: Intervention Not Indicated  Food Insecurity:  Food Insecurity Interventions: Intervention Not Indicated  Housing Insecurity:  Housing Interventions: Intervention Not Indicated  Transportation:   Transportation Interventions: Intervention Not Indicated    Other Care Navigation Interventions:     Provided Pharmacy assistance resources  Pt denied any issues at this time with obtaining or affording medications   Follow-up plan:  No additional questions/concerns at this time for LCSW. I remain available as needed.

## 2022-01-23 NOTE — Progress Notes (Unsigned)
Cardiology Clinic Note   Patient Name: [name] [name] Date of Encounter: 01/24/2022  Primary Care Provider:  Clovis Riley, L.August Saucer, MD Primary Cardiologist:  [name] Swaziland, MD  Patient Profile    [name] [name] 66 year old male presents for follow-up evaluation of his paroxysmal atrial fibrillation and aortic valve replacement.  Past Medical History    Past Medical History:  Diagnosis Date   Anxiety    Ascending aortic aneurysm (HCC) 01/07/2014   Asthma    Chest pain, atypical    Chronic anticoagulation    Depression    Hyperlipidemia    Hypertension    S/P AVR    for endocarditis   Past Surgical History:  Procedure Laterality Date   AORTIC VALVE REPLACEMENT  1988   76mm St. Jude valve   AORTIC VALVE REPLACEMENT N/A 01/23/2014   Procedure: REDO AORTIC VALVE REPLACEMENT (AVR) with 23 St. Jude Mechanical Aortic Valved Graft;  Surgeon: Kerin Perna, MD;  Location: [facility] OR;  Service: Open Heart Surgery;  Laterality: N/A;   APPLICATION OF WOUND VAC N/A 02/22/2014   Procedure: APPLICATION OF WOUND VAC;  Surgeon: Kerin Perna, MD;  Location: [facility] OR;  Service: Vascular;  Laterality: N/A;   BENTALL PROCEDURE N/A 01/23/2014   Procedure: BENTALL PROCEDURE;  Surgeon: Kerin Perna, MD;  Location: [facility] OR;  Service: Open Heart Surgery;  Laterality: N/A;   CARDIAC CATHETERIZATION     DOPPLER ECHOCARDIOGRAPHY  06/12/1996   EF 55-60%   HAND SURGERY     I & D EXTREMITY N/A 02/22/2014   Procedure: IRRIGATION AND DEBRIDEMENT EXTREMITY-CHEST;  Surgeon: Kerin Perna, MD;  Location: [facility] OR;  Service: Vascular;  Laterality: N/A;  PULSE LAVAGE   INTRAOPERATIVE TRANSESOPHAGEAL ECHOCARDIOGRAM N/A 01/23/2014   Procedure: INTRAOPERATIVE TRANSESOPHAGEAL ECHOCARDIOGRAM;  Surgeon: Kerin Perna, MD;  Location: [facility] OR;  Service: Open Heart Surgery;  Laterality: N/A;   LEFT HEART CATHETERIZATION WITH CORONARY ANGIOGRAM N/A 01/07/2014   Procedure: LEFT HEART CATHETERIZATION WITH CORONARY ANGIOGRAM;   Surgeon: Lennette Bihari, MD;  Location: [facility] CATH LAB;  Service: Cardiovascular;  Laterality: N/A;    Allergies  No Known Allergies  History of Present Illness    [name] [name] has past medical history of hypertension, paroxysmal atrial fibrillation, aortic stenosis, and ascending aortic aneurysm status post AVR 1988 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  On chronic anticoagulation/Coumadin.  Underwent cardiac catheterization 2015 which showed nonobstructive CAD.  He does require SBE prophylaxis.   He was last seen by [name] [name] on 01/17/2018.  During that time he had several complaints.  He indicated that he smoked marijuana as a young man and had stopped in his mid 75s.  However, 6-1/2 years ago he started smoking marijuana again with his girlfriend and experienced lung inflammation as well as asthma-like symptoms when he smoked.  His girlfriend died around 5 years ago but he continued to smoke.  He reported that September 7 he smoked marijuana and took Viagra with his new girlfriend.  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  On September 9 he experienced chest wall pain but also stated that he had an upper respiratory cold.  His chest pain was described as lateral chest wall discomfort bilaterally.  It was worth worse with deep inspiration and certain movements.  After September 9 he worked doing labor-intensive tasks/jobs on his farm without chest pain.  He recovered from his chest cold by  taking Mucinex.  He had begun to feel much better but was concerned about his cardiac health.  He was concerned about taking Viagra with this cardiac medications.  Finally he mentions he was concerned about pneumonia but indicated that his PCP did not think he was suffering from pneumonia.   He presented to the clinic 12/13/2019 for follow-up evaluation and stated he felt well.  He continued to be active maintaining his property.  He stated that  he did have some dietary indiscretion and used salt.  His blood pressure was 142/72.  He states that he was laid off during the COVID-19 pandemic.  This was okay with him because he states he had several things to do around his property.  He had stopped smoking marijuana and felt that he was breathing better.   He wished to defer his echocardiogram at the time due to lack of insurance.  I asked him to follow a low-sodium diet and gave him salty 6 diet sheet, maintain his physical activity and planned follow-up with Dr. Swaziland in 1 year.  He was seen in follow-up by Dr. Swaziland on 08/05/2020.  During that time he reported having chronic stomach pain which had resolved after stopping coffee.  He was not taking atorvastatin during the time his lipids were evaluated.  He had been prescribed rosuvastatin which was more expensive.  He reported eating a lot of ice cream and had gained weight.  He denied chest pain and shortness of breath.  He had not smoked marijuana in 2 years.  He presents to the clinic today for follow-up evaluation and states he feels well.  He continues to work on his property cutting trees.  He continues to watch his diet and is 154 pounds today.  His blood pressure is 122/60.  We reviewed his most recent chest CT and visit with Dr. Maren Beach.  He is concerned about the lung nodule that was seen.  We reviewed recommendations for follow-up imaging.  He will reach out to Dr. Lorrin Mais office to get their recommendations.  He was also seen by GI and requested colonoscopy.  His CTA did not show any colon cancer.  He reports that he is unemployed at this time.  We will repeat his fasting lipids and LFTs in 4 to 5 weeks when he returns and is fasting.  We will plan follow-up in 1 year.  I recommended that he stop smoking marijuana.   Today he denies chest pain, shortness of breath, lower extremity edema, fatigue, palpitations, melena, hematuria, hemoptysis, diaphoresis, weakness, presyncope, syncope,  orthopnea, and PND.  Home Medications    Prior to Admission medications   Medication Sig Start Date End Date Taking? Authorizing Provider  ALPRAZolam Prudy Feeler) 0.5 MG tablet Take 0.5 mg by mouth daily as needed for anxiety.     [provider]  atorvastatin (LIPITOR) 80 MG tablet Take 1 tablet by mouth once daily 01/03/22   [name], [name] M, MD  dicyclomine (BENTYL) 10 MG capsule Take by mouth. 11/23/21   [provider]  diphenhydramine-acetaminophen (TYLENOL PM) 25-500 MG TABS Take 1 tablet by mouth at bedtime as needed (sleep).    [provider]  Fluticasone Furoate 200 MCG/ACT AEPB Inhale 1 puff into the lungs as needed.    [provider]  metoprolol tartrate (LOPRESSOR) 25 MG tablet Take 1/2 (one-half) tablet by mouth twice daily 11/23/21   [name], [name] M, MD  polyethylene glycol [facility] Ethelene Hal) packet Take 17 g by mouth 2 (two)  times daily. 02/14/14   Wayland SalinasBednar, John, MD  warfarin (COUMADIN) 5 MG tablet TAKE 1 TABLET BY MOUTH ONCE DAILY OR AS DIRECTED BY COUMADIN CLINIC 10/29/21   SwazilandJordan, [name] M, MD    Family History    Family History  Problem Relation Age of Onset   Cancer Mother    Healthy Brother    Healthy Brother    He indicated that his mother is deceased. He indicated that his father is deceased. He indicated that both of his brothers are alive. He indicated that his maternal grandmother is deceased. He indicated that his maternal grandfather is deceased. He indicated that his paternal grandmother is deceased. He indicated that his paternal grandfather is deceased.  Social History    Social History   Socioeconomic History   Marital status: Married    Spouse name: Not on file   Number of children: Not on file   Years of education: Not on file   Highest education level: Not on file  Occupational History   Occupation: Chartered certified accountantmachinist    Employer: [employer]  Tobacco Use   Smoking status: Former    Types: Cigarettes    Quit date: 05/04/1986     Years since quitting: 35.7   Smokeless tobacco: Never  Substance and Sexual Activity   Alcohol use: Yes    Alcohol/week: 3.0 standard drinks of alcohol    Types: 3 Shots of liquor per week    Comment: daily   Drug use: Yes    Types: Marijuana   Sexual activity: Not on file  Other Topics Concern   Not on file  Social History Narrative   Not on file   Social Determinants of Health   Financial Resource Strain: Low Risk  (01/19/2022)   Overall Financial Resource Strain (CARDIA)    Difficulty of Paying Living Expenses: Not very hard  Food Insecurity: No Food Insecurity (01/19/2022)   Hunger Vital Sign    Worried About Running Out of Food in the Last Year: Never true    Ran Out of Food in the Last Year: Never true  Transportation Needs: No Transportation Needs (01/19/2022)   PRAPARE - Administrator, Civil ServiceTransportation    Lack of Transportation (Medical): No    Lack of Transportation (Non-Medical): No  Physical Activity: Not on file  Stress: Not on file  Social Connections: Not on file  Intimate Partner Violence: Not on file     Review of Systems    General:  No chills, fever, night sweats or weight changes.  Cardiovascular:  No chest pain, dyspnea on exertion, edema, orthopnea, palpitations, paroxysmal nocturnal dyspnea. Dermatological: No rash, lesions/masses Respiratory: No cough, dyspnea Urologic: No hematuria, dysuria Abdominal:   No nausea, vomiting, diarrhea, bright red blood per rectum, melena, or hematemesis Neurologic:  No visual changes, wkns, changes in mental status. All other systems reviewed and are otherwise negative except as noted above.  Physical Exam    VS:  BP 122/60   Pulse 67   Ht 5\' 10"  (1.778 m)   Wt 154 lb (69.9 kg)   SpO2 98%   BMI 22.10 kg/m  , BMI Body mass index is 22.1 kg/m. GEN: Well nourished, well developed, in no acute distress. HEENT: normal. Neck: Supple, no JVD, carotid bruits, or masses. Cardiac: RRR, no murmurs, rubs, or gallops. No clubbing,  cyanosis, edema.  Radials/DP/PT 2+ and equal bilaterally.  Respiratory:  Respirations regular and unlabored, clear to auscultation bilaterally. GI: Soft, nontender, nondistended, BS + x 4. MS: no deformity or atrophy.  Skin: warm and dry, no rash. Neuro:  Strength and sensation are intact. Psych: Normal affect.  Accessory Clinical Findings    Recent Labs: No results found for requested labs within last 365 days.   Recent Lipid Panel    Component Value Date/Time   CHOL 151 02/16/2017 1146   TRIG 79 02/16/2017 1146   HDL 52 02/16/2017 1146   CHOLHDL 2.5 09/11/2015 1301   VLDL 11 09/11/2015 1301   LDLCALC 83 02/16/2017 1146   LDLDIRECT 181.4 11/16/2012 1419         ECG personally reviewed by me today-normal sinus rhythm septal infarct undetermined age, lateral infarct undetermined age 53 bpm- No acute changes  EKG 12/13/2019 normal sinus rhythm septal infarct undetermined age 15 bpm- No acute changes   EKG 01/17/2018 Sinus rhythm with first-degree AV block   Echocardiogram 02/08/2017 Study Conclusions  - Left ventricle: The cavity size was normal. There was mild   concentric hypertrophy. Systolic function was normal. The   estimated ejection fraction was in the range of 60% to 65%. Wall   motion was normal; there were no regional wall motion   abnormalities. Left ventricular diastolic function parameters   were normal. - Aortic valve: A mechanical prosthesis was present. Transvalvular   velocity was within the normal range. There was no stenosis.   There was mild regurgitation. Peak velocity (S): 256 cm/s.   Slightly increased from 11/2013 at 2.1 cm/s. Mean gradient (S): 17   mm Hg. VTI ratio of LVOT to aortic valve: 0.33. Valve area (VTI):   1.04 cm^2. Valve area (Vmax): 0.86 cm^2. Valve area (Vmean): 0.78   cm^2. - Mitral valve: There was trivial regurgitation. - Right ventricle: The cavity size was normal. Wall thickness was   normal. Systolic function was normal. -  Tricuspid valve: There was trivial regurgitation. - Pulmonary arteries: PA peak pressure: 18 mm Hg (S). - Inferior vena cava: The vessel was normal in size. The   respirophasic diameter changes were in the normal range (= 50%),   consistent with normal central venous pressure.  Assessment & Plan   1.   Paroxysmal atrial fibrillation-denies recent episodes of irregular or accelerated heart rate.  Heart rate today 67 bpm.  EKG shows no ectopy septal infarct undetermined age, lateral infarct undetermined age no ectopy. Continue warfarin, metoprolol Heart healthy low-sodium diet-salty 6 reviewed Increase physical activity as tolerated   Status post AVR-no increased DOE or activity intolerance.  Initially replaced due to bacterial endocarditis 1988 with redo with total aortic root replacement using mechanical valve-echocardiogram 10/18 showed normal functioning mechanical prosthetic valve.  Patient recently followed up with Dr. Maren Beach.  CT images reviewed.  He recommended annual surveillance CTA starting in June 2024.    Hyperlipidemia-reports compliance with atorvastatin. Heart healthy low-sodium high-fiber diet Continue atorvastatin Repeat fasting lipids and LFTs   Atypical chest pain-  No chest pain today.  Staying physically active on his property, farm. Continue atorvastatin, metoprolol Heart healthy low-sodium diet Increase physical activity as tolerated  Lung nodule-noted on 10/05/2021 CT.  Recommendation was for repeat noncontrast CT in 3 months. Patient will reach out to Dr. Maren Beach for recommendations on CT.  Disposition: Follow-up with Dr. Swaziland in 1 year.  Thomasene Ripple. Javious Hallisey NP-C     01/24/2022, 2:14 PM [facility] [address] Office [phone] Fax [phone]  Notice: This dictation was prepared with Dragon dictation along with smaller phrase technology. Any transcriptional errors that result from this  process are  unintentional and may not be corrected upon review.  I spent 15 minutes examining this patient, reviewing medications, and using patient centered shared decision making involving her cardiac care.  Prior to her visit I spent greater than 20 minutes reviewing her past medical history,  medications, and prior cardiac tests.

## 2022-01-24 ENCOUNTER — Ambulatory Visit: Payer: Self-pay | Attending: General Practice | Admitting: General Practice

## 2022-01-24 ENCOUNTER — Encounter: Payer: Self-pay | Admitting: General Practice

## 2022-01-24 VITALS — BP 122/60 | HR 67 | Ht 70.0 in | Wt 154.0 lb

## 2022-01-24 DIAGNOSIS — E782 Mixed hyperlipidemia: Secondary | ICD-10-CM

## 2022-01-24 DIAGNOSIS — Z952 Presence of prosthetic heart valve: Secondary | ICD-10-CM

## 2022-01-24 DIAGNOSIS — I480 Paroxysmal atrial fibrillation: Secondary | ICD-10-CM

## 2022-01-24 DIAGNOSIS — R0789 Other chest pain: Secondary | ICD-10-CM

## 2022-01-24 NOTE — Patient Instructions (Addendum)
Medication Instructions:  The current medical regimen is effective;  continue present plan and medications as directed. Please refer to the Current Medication list given to you today.  *If you need a refill on your cardiac medications before your next appointment, please call your pharmacy*   Lab Work: NONE  Testing/Procedures: NONE   Follow-Up: At [facility], you and your health needs are our priority.  As part of our continuing mission to provide you with exceptional heart care, we have created designated Provider Care Teams.  These Care Teams include your primary Cardiologist (physician) and Advanced Practice Providers (APPs -  Physician Assistants and Nurse Practitioners) who all work together to provide you with the care you need, when you need it.  We recommend signing up for the patient portal called "MyChart".  Sign up information is provided on this After Visit Summary.  MyChart is used to connect with patients for Virtual Visits (Telemedicine).  Patients are able to view lab/test results, encounter notes, upcoming appointments, etc.  Non-urgent messages can be sent to your provider as well.   To learn more about what you can do with MyChart, go to [email or web address].    Your next appointment:   12 month(s)  The format for your next appointment:   In Person  Provider:   Peter Martinique, MD     Other Instructions CALL VANTRIGHT   Important Information About Sugar

## 2022-01-31 ENCOUNTER — Other Ambulatory Visit: Payer: Self-pay | Admitting: Cardiology

## 2022-01-31 NOTE — Telephone Encounter (Signed)
Refill request for warfarin:  Last INR was 3.0 on 01/17/22 Next INR due on 02/28/22 LOV was 10/2/023  Thomasene Mohair NP  Refill approved.

## 2022-02-03 ENCOUNTER — Other Ambulatory Visit: Payer: Self-pay | Admitting: *Deleted

## 2022-02-03 ENCOUNTER — Telehealth: Payer: Self-pay

## 2022-02-03 DIAGNOSIS — R911 Solitary pulmonary nodule: Secondary | ICD-10-CM

## 2022-02-03 NOTE — Telephone Encounter (Signed)
-----   Message from Madilyn Fireman sent at 02/03/2022 10:06 AM EDT ----- Regarding: RE: Lung nodule, concern I will. Thanks. ----- Message ----- From: Donnella Sham, RN Sent: 02/03/2022   9:30 AM EDT To: Jeanie Cooks Van Clines Subject: FW: Lung nodule, concern                       Otelia Santee,  Can one of you please schedule this man a PET scan? It's for a nodule that was seen on his recent chest CT and was inquiring about.    I appreciate it,  Caryl Pina ----- Message ----- From: Dahlia Byes, MD Sent: 02/01/2022   5:51 PM EDT To: Donnella Sham, RN Subject: RE: Lung nodule, concern                       Set him up for a pet scan- L lingular nodule ----- Message ----- From: Donnella Sham, RN Sent: 02/01/2022   3:17 PM EDT To: Dahlia Byes, MD Subject: Lung nodule, concern                           Hey,  Patient called this afternoon concerned about a nodule that was seen on his CT scan 11/2021. Can you take a look at it and advise?  Thanks,  Caryl Pina

## 2022-02-03 NOTE — Telephone Encounter (Signed)
Patient aware of PET scan ordered and scheduled f/u appt with Dr. Prescott Gum after Pet.

## 2022-02-14 ENCOUNTER — Encounter (HOSPITAL_COMMUNITY)
Admission: RE | Admit: 2022-02-14 | Discharge: 2022-02-14 | Disposition: A | Discharge status: Home / Self Care | Payer: PPO | Source: Ambulatory Visit | Attending: Cardiothoracic Surgery | Admitting: Cardiothoracic Surgery

## 2022-02-14 DIAGNOSIS — R911 Solitary pulmonary nodule: Secondary | ICD-10-CM | POA: Insufficient documentation

## 2022-02-14 LAB — GLUCOSE, CAPILLARY: Glucose-Capillary: 107 mg/dL — ABNORMAL HIGH (ref 70–99)

## 2022-02-14 MED ORDER — FLUDEOXYGLUCOSE F - 18 (FDG) INJECTION
7.6000 | Freq: Once | INTRAVENOUS | Status: AC
  Administered 2022-02-14: 7.99 via INTRAVENOUS

## 2022-02-23 ENCOUNTER — Ambulatory Visit: Payer: PPO | Admitting: Cardiothoracic Surgery

## 2022-02-23 ENCOUNTER — Encounter: Payer: Self-pay | Admitting: Cardiothoracic Surgery

## 2022-02-23 VITALS — BP 154/75 | HR 61 | Resp 18 | Ht 70.0 in | Wt 157.0 lb

## 2022-02-23 DIAGNOSIS — R911 Solitary pulmonary nodule: Secondary | ICD-10-CM

## 2022-02-23 NOTE — Progress Notes (Signed)
HPI: The patient returns for follow-up after PET scan to evaluate a 10 mm nodule in the lingular segment of the left upper lobe.  He has not smoked tobacco in 30 years.  The nodule was an incidental finding on a surveillance scan of his thoracic aorta and follow-up after undergoing a redo AVR-Bentall in 2015.  PET scan shows the lingular nodule to have no significant uptake, no evidence of malignant potential.  There is still some activity around the ascending aortic graft anastomosis in the anterior mediastinum.  The patient denies chest pain or shortness of breath. He is following a heart healthy diet and GI healthy diet and feels great.  Current Outpatient Medications  Medication Sig Dispense Refill   ALPRAZolam (XANAX) 0.5 MG tablet Take 0.5 mg by mouth daily as needed for anxiety.      atorvastatin (LIPITOR) 80 MG tablet Take 1 tablet by mouth once daily 90 tablet 0   dicyclomine (BENTYL) 10 MG capsule Take by mouth.     diphenhydramine-acetaminophen (TYLENOL PM) 25-500 MG TABS Take 1 tablet by mouth at bedtime as needed (sleep).     Fluticasone Furoate 200 MCG/ACT AEPB Inhale 1 puff into the lungs as needed.     metoprolol tartrate (LOPRESSOR) 25 MG tablet Take 1/2 (one-half) tablet by mouth twice daily 90 tablet 0   warfarin (COUMADIN) 5 MG tablet TAKE 1 TABLET BY MOUTH ONCE DAILY OR  AS  DIRECTED  BY  COUMADIN  CLINIC 90 tablet 1   polyethylene glycol (MIRALAX / GLYCOLAX) packet Take 17 g by mouth 2 (two) times daily. 24 each 0   No current facility-administered medications for this visit.     Physical Exam: Blood pressure (!) 154/75, pulse 61, resp. rate 18, height 5\' 10"  (1.778 m), weight 157 lb (71.2 kg), SpO2 99 %.        Exam    General- alert and comfortable    Neck- no JVD, no cervical adenopathy palpable, no carotid bruit   Lungs- clear without rales, wheezes   Cor- regular rate and rhythm, sharp systolic mechanical valve click with soft systolic flow murmur, no AI  diastolic murmur   Abdomen- soft, non-tender   Extremities - warm, non-tender, minimal edema   Neuro- oriented, appropriate, no focal weakness  Diagnostic Tests: PET scan images displayed and reviewed with patient showing no enhance metabolic activity of the small left upper lobe nodule. Looking back at a postoperative CT scan in 2015 demonstrates a less well-defined nodule in the same area of the lingula measuring approximately 9 mm  Impression: No evidence of pulmonary malignancy Aortic graft remains intact Plan: Plan continued surveillance CT scan of aortic root replacement in 18 months.  Importance of blood pressure control stressed to the patient.  Blood pressure should be less than 106 systolic.  He understands importance of antibiotic prophylaxis before any dental procedures as well.   Dahlia Byes, MD [facility] [phone]

## 2022-02-28 ENCOUNTER — Ambulatory Visit: Payer: PPO | Attending: Cardiology

## 2022-02-28 DIAGNOSIS — I480 Paroxysmal atrial fibrillation: Secondary | ICD-10-CM

## 2022-02-28 DIAGNOSIS — Z952 Presence of prosthetic heart valve: Secondary | ICD-10-CM | POA: Diagnosis not present

## 2022-02-28 DIAGNOSIS — I359 Nonrheumatic aortic valve disorder, unspecified: Secondary | ICD-10-CM | POA: Diagnosis not present

## 2022-02-28 DIAGNOSIS — Z5181 Encounter for therapeutic drug level monitoring: Secondary | ICD-10-CM | POA: Diagnosis not present

## 2022-02-28 LAB — POCT INR: INR: 2.5 (ref 2.0–3.0)

## 2022-02-28 NOTE — Patient Instructions (Signed)
Continue taking 1 tablet daily. Recheck INR in 6 weeks.   Call with any problems or new medications [phone]

## 2022-03-07 ENCOUNTER — Other Ambulatory Visit: Payer: Self-pay | Admitting: Cardiology

## 2022-04-11 ENCOUNTER — Ambulatory Visit: Payer: PPO | Attending: Cardiology

## 2022-04-11 DIAGNOSIS — I480 Paroxysmal atrial fibrillation: Secondary | ICD-10-CM | POA: Diagnosis not present

## 2022-04-11 DIAGNOSIS — Z952 Presence of prosthetic heart valve: Secondary | ICD-10-CM | POA: Diagnosis not present

## 2022-04-11 DIAGNOSIS — Z5181 Encounter for therapeutic drug level monitoring: Secondary | ICD-10-CM

## 2022-04-11 DIAGNOSIS — I359 Nonrheumatic aortic valve disorder, unspecified: Secondary | ICD-10-CM | POA: Diagnosis not present

## 2022-04-11 LAB — POCT INR: INR: 3.1 — AB (ref 2.0–3.0)

## 2022-04-11 NOTE — Patient Instructions (Signed)
Description   Continue taking 1 tablet daily. Recheck INR in 6 weeks.   Call with any problems or new medications [phone]

## 2022-05-04 ENCOUNTER — Other Ambulatory Visit: Payer: Self-pay | Admitting: Cardiology

## 2022-05-23 ENCOUNTER — Ambulatory Visit: Payer: PPO | Attending: Cardiovascular Disease

## 2022-05-23 DIAGNOSIS — Z952 Presence of prosthetic heart valve: Secondary | ICD-10-CM | POA: Diagnosis not present

## 2022-05-23 DIAGNOSIS — Z5181 Encounter for therapeutic drug level monitoring: Secondary | ICD-10-CM

## 2022-05-23 DIAGNOSIS — I480 Paroxysmal atrial fibrillation: Secondary | ICD-10-CM

## 2022-05-23 DIAGNOSIS — I359 Nonrheumatic aortic valve disorder, unspecified: Secondary | ICD-10-CM | POA: Diagnosis not present

## 2022-05-23 LAB — POCT INR: INR: 4.9 — AB (ref 2.0–3.0)

## 2022-05-23 NOTE — Patient Instructions (Signed)
HOLD TODAY AND TUESDAY THEN Continue taking 1 tablet daily. Recheck INR in 4 weeks.   Call with any problems or new medications [phone]

## 2022-06-20 ENCOUNTER — Ambulatory Visit: Payer: PPO | Attending: Cardiology | Admitting: *Deleted

## 2022-06-20 DIAGNOSIS — I359 Nonrheumatic aortic valve disorder, unspecified: Secondary | ICD-10-CM | POA: Diagnosis not present

## 2022-06-20 DIAGNOSIS — Z952 Presence of prosthetic heart valve: Secondary | ICD-10-CM | POA: Diagnosis not present

## 2022-06-20 DIAGNOSIS — Z5181 Encounter for therapeutic drug level monitoring: Secondary | ICD-10-CM | POA: Diagnosis not present

## 2022-06-20 DIAGNOSIS — I480 Paroxysmal atrial fibrillation: Secondary | ICD-10-CM | POA: Diagnosis not present

## 2022-06-20 LAB — POCT INR: POC INR: 3.9

## 2022-06-20 NOTE — Patient Instructions (Addendum)
Description    Hold warfarin tomorrow and then START taking warfarin 1 tablet tablet daily except for 1/2 a tablet on Tuesdays. Recheck INR in 4 weeks, per pt request.  Call with any problems or new medications [phone]

## 2022-07-18 ENCOUNTER — Ambulatory Visit: Payer: PPO

## 2022-07-20 ENCOUNTER — Other Ambulatory Visit: Payer: Self-pay | Admitting: Cardiology

## 2022-07-20 DIAGNOSIS — J209 Acute bronchitis, unspecified: Secondary | ICD-10-CM | POA: Diagnosis not present

## 2022-07-20 DIAGNOSIS — R059 Cough, unspecified: Secondary | ICD-10-CM | POA: Diagnosis not present

## 2022-07-20 DIAGNOSIS — R0981 Nasal congestion: Secondary | ICD-10-CM | POA: Diagnosis not present

## 2022-07-25 ENCOUNTER — Ambulatory Visit: Payer: PPO | Attending: Cardiology | Admitting: Pharmacist

## 2022-07-25 DIAGNOSIS — I480 Paroxysmal atrial fibrillation: Secondary | ICD-10-CM

## 2022-07-25 DIAGNOSIS — I359 Nonrheumatic aortic valve disorder, unspecified: Secondary | ICD-10-CM

## 2022-07-25 DIAGNOSIS — Z952 Presence of prosthetic heart valve: Secondary | ICD-10-CM

## 2022-07-25 DIAGNOSIS — Z5181 Encounter for therapeutic drug level monitoring: Secondary | ICD-10-CM

## 2022-07-25 LAB — POCT INR: POC INR: 3.4

## 2022-07-25 NOTE — Patient Instructions (Signed)
Continue taking warfarin 1 tablet tablet daily except for 1/2 a tablet on Tuesdays. Recheck INR in 4 weeks, per pt request.  Call with any problems or new medications [phone]

## 2022-08-08 ENCOUNTER — Other Ambulatory Visit: Payer: Self-pay | Admitting: Cardiology

## 2022-08-08 DIAGNOSIS — E7800 Pure hypercholesterolemia, unspecified: Secondary | ICD-10-CM | POA: Diagnosis not present

## 2022-08-08 DIAGNOSIS — F419 Anxiety disorder, unspecified: Secondary | ICD-10-CM | POA: Diagnosis not present

## 2022-08-08 DIAGNOSIS — Z125 Encounter for screening for malignant neoplasm of prostate: Secondary | ICD-10-CM | POA: Diagnosis not present

## 2022-08-08 DIAGNOSIS — J029 Acute pharyngitis, unspecified: Secondary | ICD-10-CM | POA: Diagnosis not present

## 2022-08-08 DIAGNOSIS — Z7901 Long term (current) use of anticoagulants: Secondary | ICD-10-CM | POA: Diagnosis not present

## 2022-08-08 DIAGNOSIS — Z0000 Encounter for general adult medical examination without abnormal findings: Secondary | ICD-10-CM | POA: Diagnosis not present

## 2022-08-08 DIAGNOSIS — J4520 Mild intermittent asthma, uncomplicated: Secondary | ICD-10-CM | POA: Diagnosis not present

## 2022-08-08 DIAGNOSIS — I480 Paroxysmal atrial fibrillation: Secondary | ICD-10-CM | POA: Diagnosis not present

## 2022-08-08 LAB — LAB REPORT - SCANNED: EGFR: 92

## 2022-08-09 ENCOUNTER — Other Ambulatory Visit: Payer: Self-pay | Admitting: *Deleted

## 2022-08-09 MED ORDER — ATORVASTATIN CALCIUM 80 MG PO TABS: 80.0000 mg | ORAL_TABLET | Freq: Every day | ORAL | 1 refills | Status: DC

## 2022-08-17 ENCOUNTER — Other Ambulatory Visit: Payer: Self-pay | Admitting: Cardiology

## 2022-08-17 DIAGNOSIS — I480 Paroxysmal atrial fibrillation: Secondary | ICD-10-CM

## 2022-08-17 NOTE — Telephone Encounter (Signed)
Warfarin  refill Afib Last INR 07/25/22 Last OV 01/24/22

## 2022-08-22 ENCOUNTER — Ambulatory Visit: Payer: PPO | Attending: Cardiology | Admitting: *Deleted

## 2022-08-22 DIAGNOSIS — I359 Nonrheumatic aortic valve disorder, unspecified: Secondary | ICD-10-CM | POA: Diagnosis not present

## 2022-08-22 DIAGNOSIS — I480 Paroxysmal atrial fibrillation: Secondary | ICD-10-CM | POA: Diagnosis not present

## 2022-08-22 DIAGNOSIS — Z5181 Encounter for therapeutic drug level monitoring: Secondary | ICD-10-CM | POA: Diagnosis not present

## 2022-08-22 DIAGNOSIS — Z952 Presence of prosthetic heart valve: Secondary | ICD-10-CM

## 2022-08-22 LAB — POCT INR: INR: 5.7 — AB (ref 2.0–3.0)

## 2022-08-22 NOTE — Patient Instructions (Signed)
Description   Do not take any warfarin today and no warfarin tomorrow then continue taking warfarin 1 tablet tablet daily except for 1/2 tablet on Tuesdays. Recheck INR in 3 weeks, per pt request. Call with any problems or new medications [phone]

## 2022-09-06 ENCOUNTER — Other Ambulatory Visit: Payer: Self-pay | Admitting: Cardiothoracic Surgery

## 2022-09-06 DIAGNOSIS — R911 Solitary pulmonary nodule: Secondary | ICD-10-CM

## 2022-09-12 ENCOUNTER — Ambulatory Visit: Payer: PPO | Attending: Cardiology | Admitting: Pharmacist

## 2022-09-12 DIAGNOSIS — Z952 Presence of prosthetic heart valve: Secondary | ICD-10-CM

## 2022-09-12 DIAGNOSIS — I359 Nonrheumatic aortic valve disorder, unspecified: Secondary | ICD-10-CM | POA: Diagnosis not present

## 2022-09-12 DIAGNOSIS — R221 Localized swelling, mass and lump, neck: Secondary | ICD-10-CM | POA: Diagnosis not present

## 2022-09-12 DIAGNOSIS — I480 Paroxysmal atrial fibrillation: Secondary | ICD-10-CM

## 2022-09-12 DIAGNOSIS — Z5181 Encounter for therapeutic drug level monitoring: Secondary | ICD-10-CM

## 2022-09-12 LAB — POCT INR: INR: 3.8 — AB (ref 2.0–3.0)

## 2022-09-12 NOTE — Patient Instructions (Signed)
Description   Do not take any warfarin today and then change schedule to warfarin 1 tablet tablet daily except for 1/2 tablet on Tuesdays AND Thursdays. Recheck INR in 3 weeks, per pt request. Call with any problems or new medications [phone]

## 2022-09-14 DIAGNOSIS — L723 Sebaceous cyst: Secondary | ICD-10-CM | POA: Diagnosis not present

## 2022-10-03 ENCOUNTER — Ambulatory Visit: Payer: PPO | Attending: Internal Medicine

## 2022-10-03 DIAGNOSIS — I480 Paroxysmal atrial fibrillation: Secondary | ICD-10-CM | POA: Diagnosis not present

## 2022-10-03 DIAGNOSIS — Z5181 Encounter for therapeutic drug level monitoring: Secondary | ICD-10-CM

## 2022-10-03 DIAGNOSIS — I359 Nonrheumatic aortic valve disorder, unspecified: Secondary | ICD-10-CM | POA: Diagnosis not present

## 2022-10-03 DIAGNOSIS — Z952 Presence of prosthetic heart valve: Secondary | ICD-10-CM | POA: Diagnosis not present

## 2022-10-03 LAB — POCT INR: INR: 2 (ref 2.0–3.0)

## 2022-10-03 NOTE — Patient Instructions (Signed)
TAKE 2 TABLETS TODAY ONLY THEN CONTINUE  1 tablet tablet daily except for 1/2 tablet on Tuesdays AND Thursdays. Recheck INR in 3 weeks, per pt request. Call with any problems or new medications [phone]

## 2022-10-24 ENCOUNTER — Other Ambulatory Visit: Payer: PPO

## 2022-10-24 ENCOUNTER — Ambulatory Visit: Payer: PPO | Admitting: Cardiothoracic Surgery

## 2022-10-24 ENCOUNTER — Ambulatory Visit: Payer: PPO | Attending: Cardiology

## 2022-10-24 DIAGNOSIS — Z952 Presence of prosthetic heart valve: Secondary | ICD-10-CM

## 2022-10-24 DIAGNOSIS — I480 Paroxysmal atrial fibrillation: Secondary | ICD-10-CM

## 2022-10-24 DIAGNOSIS — Z5181 Encounter for therapeutic drug level monitoring: Secondary | ICD-10-CM | POA: Diagnosis not present

## 2022-10-24 DIAGNOSIS — I359 Nonrheumatic aortic valve disorder, unspecified: Secondary | ICD-10-CM | POA: Diagnosis not present

## 2022-10-24 LAB — POCT INR: INR: 3.4 — AB (ref 2.0–3.0)

## 2022-10-24 NOTE — Patient Instructions (Signed)
Description   Continue 1 tablet tablet daily except for 1/2 tablet on Tuesdays AND Thursdays.  Recheck INR in 4 weeks.  Call with any problems or new medications [phone]

## 2022-11-21 ENCOUNTER — Ambulatory Visit: Payer: PPO | Attending: Cardiology

## 2022-11-21 DIAGNOSIS — Z5181 Encounter for therapeutic drug level monitoring: Secondary | ICD-10-CM

## 2022-11-21 DIAGNOSIS — Z952 Presence of prosthetic heart valve: Secondary | ICD-10-CM | POA: Diagnosis not present

## 2022-11-21 DIAGNOSIS — I359 Nonrheumatic aortic valve disorder, unspecified: Secondary | ICD-10-CM | POA: Diagnosis not present

## 2022-11-21 DIAGNOSIS — I480 Paroxysmal atrial fibrillation: Secondary | ICD-10-CM

## 2022-11-21 LAB — POCT INR: INR: 2.3 (ref 2.0–3.0)

## 2022-11-21 NOTE — Patient Instructions (Signed)
Take 1.5 tablets today only then Continue 1 tablet tablet daily except for 1/2 tablet on Tuesdays AND Thursdays.  Recheck INR in 4 weeks.  Call with any problems or new medications [phone]

## 2022-12-19 ENCOUNTER — Ambulatory Visit: Payer: PPO

## 2022-12-19 DIAGNOSIS — Z952 Presence of prosthetic heart valve: Secondary | ICD-10-CM | POA: Diagnosis not present

## 2022-12-19 DIAGNOSIS — I480 Paroxysmal atrial fibrillation: Secondary | ICD-10-CM

## 2022-12-19 DIAGNOSIS — Z5181 Encounter for therapeutic drug level monitoring: Secondary | ICD-10-CM

## 2022-12-19 DIAGNOSIS — I359 Nonrheumatic aortic valve disorder, unspecified: Secondary | ICD-10-CM

## 2022-12-19 LAB — POCT INR: INR: 2.4 (ref 2.0–3.0)

## 2022-12-19 NOTE — Patient Instructions (Signed)
INCREASE TO 1 tablet tablet daily except for 1/2 tablet on Tuesdays.  Recheck INR in 5 weeks.  Call with any problems or new medications [phone]

## 2023-01-23 ENCOUNTER — Ambulatory Visit: Payer: PPO | Attending: Internal Medicine | Admitting: *Deleted

## 2023-01-23 DIAGNOSIS — I480 Paroxysmal atrial fibrillation: Secondary | ICD-10-CM

## 2023-01-23 DIAGNOSIS — I359 Nonrheumatic aortic valve disorder, unspecified: Secondary | ICD-10-CM | POA: Diagnosis not present

## 2023-01-23 DIAGNOSIS — Z952 Presence of prosthetic heart valve: Secondary | ICD-10-CM

## 2023-01-23 DIAGNOSIS — Z5181 Encounter for therapeutic drug level monitoring: Secondary | ICD-10-CM | POA: Diagnosis not present

## 2023-01-23 LAB — POCT INR: INR: 2.4 (ref 2.0–3.0)

## 2023-01-23 NOTE — Patient Instructions (Signed)
Description   Today take 1.5 tablets of warfarin then continue taking 1 tablet tablet daily except for 1/2 tablet on Tuesdays.  Recheck INR in 5 weeks.  Call with any problems or new medications [phone]

## 2023-02-27 ENCOUNTER — Ambulatory Visit: Payer: PPO | Attending: Cardiology

## 2023-02-27 DIAGNOSIS — Z952 Presence of prosthetic heart valve: Secondary | ICD-10-CM | POA: Diagnosis not present

## 2023-02-27 DIAGNOSIS — Z5181 Encounter for therapeutic drug level monitoring: Secondary | ICD-10-CM

## 2023-02-27 DIAGNOSIS — I480 Paroxysmal atrial fibrillation: Secondary | ICD-10-CM

## 2023-02-27 DIAGNOSIS — I359 Nonrheumatic aortic valve disorder, unspecified: Secondary | ICD-10-CM | POA: Diagnosis not present

## 2023-02-27 LAB — POCT INR: INR: 3.1 — AB (ref 2.0–3.0)

## 2023-02-27 NOTE — Patient Instructions (Signed)
continue taking 1 tablet tablet daily except for 1/2 tablet on Tuesdays.  Recheck INR in 6 weeks.  Call with any problems or new medications [phone]

## 2023-03-15 ENCOUNTER — Other Ambulatory Visit: Payer: Self-pay | Admitting: Cardiology

## 2023-03-15 DIAGNOSIS — I480 Paroxysmal atrial fibrillation: Secondary | ICD-10-CM

## 2023-04-04 ENCOUNTER — Other Ambulatory Visit: Payer: Self-pay | Admitting: Cardiothoracic Surgery

## 2023-04-04 DIAGNOSIS — I7121 Aneurysm of the ascending aorta, without rupture: Secondary | ICD-10-CM

## 2023-04-06 ENCOUNTER — Other Ambulatory Visit: Payer: Self-pay | Admitting: Cardiology

## 2023-04-10 ENCOUNTER — Ambulatory Visit: Payer: PPO | Attending: Cardiovascular Disease

## 2023-04-10 DIAGNOSIS — I480 Paroxysmal atrial fibrillation: Secondary | ICD-10-CM | POA: Diagnosis not present

## 2023-04-10 DIAGNOSIS — I359 Nonrheumatic aortic valve disorder, unspecified: Secondary | ICD-10-CM

## 2023-04-10 DIAGNOSIS — Z952 Presence of prosthetic heart valve: Secondary | ICD-10-CM

## 2023-04-10 DIAGNOSIS — Z5181 Encounter for therapeutic drug level monitoring: Secondary | ICD-10-CM

## 2023-04-10 LAB — POCT INR: INR: 2.7 (ref 2.0–3.0)

## 2023-04-10 NOTE — Patient Instructions (Signed)
Description   Continue taking 1 tablet tablet daily except for 1/2 tablet on Tuesdays.  Recheck INR in 6 weeks.  Call with any problems or new medications [phone]

## 2023-04-28 ENCOUNTER — Ambulatory Visit
Admission: RE | Admit: 2023-04-28 | Discharge: 2023-04-28 | Disposition: A | Discharge status: Home / Self Care | Payer: PPO | Source: Ambulatory Visit | Attending: Cardiothoracic Surgery | Admitting: Cardiothoracic Surgery

## 2023-04-28 DIAGNOSIS — I7121 Aneurysm of the ascending aorta, without rupture: Secondary | ICD-10-CM

## 2023-04-28 MED ORDER — IOPAMIDOL (ISOVUE-300) INJECTION 61%
200.0000 mL | Freq: Once | INTRAVENOUS | Status: AC | PRN
Start: 2023-04-28 — End: 2023-04-28
  Administered 2023-04-28: 75 mL via INTRAVENOUS

## 2023-05-01 ENCOUNTER — Other Ambulatory Visit: Payer: Self-pay | Admitting: Cardiothoracic Surgery

## 2023-05-01 ENCOUNTER — Ambulatory Visit: Payer: PPO | Admitting: Cardiothoracic Surgery

## 2023-05-01 DIAGNOSIS — I7121 Aneurysm of the ascending aorta, without rupture: Secondary | ICD-10-CM

## 2023-05-03 ENCOUNTER — Ambulatory Visit (HOSPITAL_COMMUNITY): Payer: PPO | Attending: Cardiology

## 2023-05-03 DIAGNOSIS — I7121 Aneurysm of the ascending aorta, without rupture: Secondary | ICD-10-CM | POA: Insufficient documentation

## 2023-05-03 LAB — ECHOCARDIOGRAM COMPLETE
AV Mean grad: 16 mm[Hg]
AV Peak grad: 32.5 mm[Hg]
Ao pk vel: 2.85 m/s
Area-P 1/2: 4.6 cm2
S' Lateral: 2.3 cm

## 2023-05-03 MED ORDER — PERFLUTREN LIPID MICROSPHERE
1.0000 mL | INTRAVENOUS | Status: AC | PRN
  Administered 2023-05-03: 2 mL via INTRAVENOUS

## 2023-05-04 ENCOUNTER — Other Ambulatory Visit (HOSPITAL_COMMUNITY): Payer: Self-pay | Admitting: Cardiothoracic Surgery

## 2023-05-04 ENCOUNTER — Ambulatory Visit: Payer: PPO | Admitting: Cardiothoracic Surgery

## 2023-05-04 ENCOUNTER — Telehealth: Payer: Self-pay

## 2023-05-04 ENCOUNTER — Encounter: Payer: Self-pay | Admitting: Cardiothoracic Surgery

## 2023-05-04 VITALS — BP 163/81 | HR 59 | Resp 20 | Wt 197.4 lb

## 2023-05-04 DIAGNOSIS — I7121 Aneurysm of the ascending aorta, without rupture: Secondary | ICD-10-CM

## 2023-05-04 DIAGNOSIS — Z9889 Other specified postprocedural states: Secondary | ICD-10-CM | POA: Insufficient documentation

## 2023-05-04 DIAGNOSIS — I358 Other nonrheumatic aortic valve disorders: Secondary | ICD-10-CM | POA: Insufficient documentation

## 2023-05-04 DIAGNOSIS — Q254 Congenital malformation of aorta unspecified: Secondary | ICD-10-CM

## 2023-05-04 DIAGNOSIS — Z952 Presence of prosthetic heart valve: Secondary | ICD-10-CM

## 2023-05-04 DIAGNOSIS — L905 Scar conditions and fibrosis of skin: Secondary | ICD-10-CM | POA: Diagnosis not present

## 2023-05-04 NOTE — Progress Notes (Signed)
 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  He has done well over the years and has been compliant with his Coumadin  and checking INR at the Coumadin  clinic.  He returned for an 49-month CTA follow-up of his Bentall procedure on January 3.  This showed the graft intact and a lingular subcentimeter nodule stable and at low risk.  There was however a retrosternal soft tissue density that had somewhat enlarged since the previous CTA 18 months ago.  Patient is asymptomatic and continues to have a good functional level.  The recent echocardiogram shows normal LV, RV function.  There is no AI and a slight gradient to the prosthetic valve (23 mm Saint Jude).  Aortic blood flow through the root and lower ascending aorta peers to be normal.  Patient recently developed a sinus congestion and cold and cough and has been taking some Sudafed and Mucinex.  Current Outpatient Medications  Medication Sig Dispense Refill   ALPRAZolam  (XANAX ) 0.5 MG tablet Take 0.5 mg by mouth daily as needed for anxiety.      atorvastatin  (LIPITOR) 80 MG tablet Take 1 tablet (80 mg total) by mouth daily. Patient is overdue for appointment. Please call [phone] to schedule an appointment. 90 tablet 0   dicyclomine (BENTYL) 10 MG capsule Take by mouth.     diphenhydramine -acetaminophen  (TYLENOL  PM) 25-500 MG TABS Take 1 tablet by mouth at bedtime as needed (sleep).     Fluticasone Furoate 200 MCG/ACT AEPB Inhale 1 puff into the lungs as needed.     metoprolol  tartrate (LOPRESSOR ) 25 MG tablet TAKE 1/2 (ONE-HALF) TABLET BY MOUTH TWICE DAILY . APPOINTMENT REQUIRED FOR FUTURE REFILLS 90 tablet 2   warfarin (COUMADIN ) 5 MG tablet TAKE 1 TABLET BY MOUTH ONCE DAILY OR AS DIRECTED BY COUMADIN  CLINIC 100 tablet 0   No current facility-administered medications for this visit.      Physical Exam:  Blood pressure (!) 163/81, pulse (!) 59, resp. rate 20, weight 197 lb 6.4 oz (89.5 kg), SpO2 94%.        Exam    General- alert and comfortable.  Previous sternal incision well-healed.    Neck- no JVD, no cervical adenopathy palpable, no carotid bruit   Lungs- clear without rales, wheezes   Cor- regular rate and rhythm, normal sounding mechanical aortic prosthesis with soft flow murmur, no AI.   Abdomen- soft, non-tender   Extremities - warm, non-tender, minimal edema   Neuro- oriented, appropriate, no focal weakness  Diagnostic Tests: Images of CTA and echocardiogram reviewed with patient. CTA of the heart and aorta 10 years post Karyle looks satisfactory.  The echocardiogram shows good ventricular and aortic valve function.  The retrosternal soft tissue density is probably related to longstanding scar tissue from a postop mediastinal hematoma which remains to have some hypermetabolic activity on recent PET scan.  To be sure that this retrosternal soft tissue density is not related to the aortic graft the patient will have a cardiac CT scan-I have discussed this with Dr. Darryle Decent for coordination of care.  The patient will return to the office to review the results of the cardiac CT.  Impression: 68 year old male 10-year status post redo sternotomy for mechanical Bentall procedure.  Doing well but a retrosternal soft tissue density has been noted to have increased in size over the interim 18 months between.  surveillance scans and this will  be further reviewed with a cardiac scan.  Plan: Cardiac CT scan followed by office visit. Patient is counseled to avoid taking Sudafed because of associated high blood pressure.   Maude Ferguson, MD [facility] [phone]

## 2023-05-04 NOTE — Telephone Encounter (Signed)
 Spoke to patient follow up appointment scheduled with Dr.Jordan 05/30/23 at 2:00 pm.

## 2023-05-05 ENCOUNTER — Other Ambulatory Visit (HOSPITAL_COMMUNITY): Payer: Self-pay | Admitting: Cardiothoracic Surgery

## 2023-05-05 DIAGNOSIS — I2510 Atherosclerotic heart disease of native coronary artery without angina pectoris: Secondary | ICD-10-CM

## 2023-05-16 ENCOUNTER — Telehealth (HOSPITAL_COMMUNITY): Payer: Self-pay | Admitting: *Deleted

## 2023-05-16 ENCOUNTER — Telehealth (HOSPITAL_COMMUNITY): Payer: Self-pay | Admitting: Emergency Medicine

## 2023-05-16 NOTE — Telephone Encounter (Signed)
Patient returning call about his upcoming cardiac imaging study; pt verbalizes understanding of appt date/time, parking situation and where to check in, pre-test NPO status and verified current allergies; name and call back number provided for further questions should they arise  Larey Brick RN Navigator Cardiac Imaging [facility] [phone] office [phone] cell  Patient aware to arrive at 1:30 PM.

## 2023-05-16 NOTE — Telephone Encounter (Signed)
Attempted to call patient regarding upcoming cardiac CT appointment. °Left message on voicemail with name and callback number °Dwan Fennel RN Navigator Cardiac Imaging °[facility] °[phone] Office °[phone] Cell ° °

## 2023-05-17 ENCOUNTER — Ambulatory Visit (HOSPITAL_BASED_OUTPATIENT_CLINIC_OR_DEPARTMENT_OTHER)
Admission: RE | Admit: 2023-05-17 | Discharge: 2023-05-17 | Disposition: A | Discharge status: Home / Self Care | Payer: PPO | Source: Ambulatory Visit | Attending: Cardiovascular Disease | Admitting: Cardiovascular Disease

## 2023-05-17 ENCOUNTER — Ambulatory Visit (HOSPITAL_COMMUNITY)
Admission: RE | Admit: 2023-05-17 | Discharge: 2023-05-17 | Disposition: A | Discharge status: Home / Self Care | Payer: PPO | Source: Ambulatory Visit | Attending: Cardiothoracic Surgery | Admitting: Cardiothoracic Surgery

## 2023-05-17 ENCOUNTER — Ambulatory Visit (HOSPITAL_COMMUNITY): Payer: PPO

## 2023-05-17 ENCOUNTER — Other Ambulatory Visit: Payer: Self-pay | Admitting: Cardiovascular Disease

## 2023-05-17 DIAGNOSIS — Q254 Congenital malformation of aorta unspecified: Secondary | ICD-10-CM | POA: Insufficient documentation

## 2023-05-17 DIAGNOSIS — I2510 Atherosclerotic heart disease of native coronary artery without angina pectoris: Secondary | ICD-10-CM | POA: Insufficient documentation

## 2023-05-17 DIAGNOSIS — R931 Abnormal findings on diagnostic imaging of heart and coronary circulation: Secondary | ICD-10-CM

## 2023-05-17 MED ORDER — NITROGLYCERIN 0.4 MG SL SUBL
SUBLINGUAL_TABLET | SUBLINGUAL | Status: AC
  Filled 2023-05-17: qty 2

## 2023-05-17 MED ORDER — IOHEXOL 350 MG/ML SOLN
100.0000 mL | Freq: Once | INTRAVENOUS | Status: AC | PRN
  Administered 2023-05-17: 100 mL via INTRAVENOUS

## 2023-05-17 MED ORDER — NITROGLYCERIN 0.4 MG SL SUBL
0.8000 mg | SUBLINGUAL_TABLET | Freq: Once | SUBLINGUAL | Status: AC
  Administered 2023-05-17: 0.8 mg via SUBLINGUAL

## 2023-05-17 NOTE — Progress Notes (Signed)
CT FFR ordered.  Gerri Spore T. Flora Lipps, MD, [facility]  [facility]  [address], [address] [place], [place] [address] [phone]  7:36 PM

## 2023-05-18 ENCOUNTER — Encounter: Payer: Self-pay | Admitting: Cardiothoracic Surgery

## 2023-05-18 ENCOUNTER — Ambulatory Visit: Payer: PPO | Admitting: Cardiothoracic Surgery

## 2023-05-18 VITALS — BP 164/85 | HR 54 | Resp 18 | Wt 194.4 lb

## 2023-05-18 DIAGNOSIS — Z952 Presence of prosthetic heart valve: Secondary | ICD-10-CM | POA: Diagnosis not present

## 2023-05-18 MED ORDER — LOSARTAN POTASSIUM 25 MG PO TABS: 25.0000 mg | ORAL_TABLET | Freq: Every day | ORAL | 5 refills | Status: DC

## 2023-05-18 NOTE — Progress Notes (Signed)
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  Patient's somewhat delayed surveillance CTA last month showed a retrosternal density with concern this could be a pseudoaneurysm.  I personally reviewed the images of the subsequent cardiac CT and discussed the results with Dr. Lethea Killings. The retrosternal density does not communicate with the graft or Bentall repair.  There is no false aneurysm.  The coronaries have mild-moderate thickening with a calcium score of 750, FFR shows no limited flow through the left main, left coronary or right coronary.  The left lingular nodular density also shows no change and is considered benign.  I discussed the results of the cardiac CT with the patient. He understands the retrosternal density is probably fluid collection, organized hematoma, or mediastinal fat closed over the graft..   The patient remains asymptomatic of chest pain.  He is trying to walk about 30 minutes every day and is compliant with his medications including Lopressor and Lipitor 80 mg. He voids fast foods and makes his own meals.  Patient's blood pressure is again elevated greater than 160 systolic.  His heart rate is 50-60.  I recommend that he start low-dose losartan 25 mg daily for better blood pressure control.   Current Outpatient Medications  Medication Sig Dispense Refill   ALPRAZolam (XANAX) 0.5 MG tablet Take 0.5 mg by mouth daily as needed for anxiety.      atorvastatin (LIPITOR) 80 MG tablet Take 1 tablet (80 mg total) by mouth daily. Patient is overdue for appointment. Please call [phone] to schedule an appointment. 90 tablet 0   dicyclomine (BENTYL) 10 MG capsule Take by mouth.     diphenhydramine-acetaminophen (TYLENOL PM) 25-500 MG TABS Take 1 tablet by mouth at bedtime  as needed (sleep).     Fluticasone Furoate 200 MCG/ACT AEPB Inhale 1 puff into the lungs as needed.     losartan (COZAAR) 25 MG tablet Take 1 tablet (25 mg total) by mouth daily. 30 tablet 5   metoprolol tartrate (LOPRESSOR) 25 MG tablet TAKE 1/2 (ONE-HALF) TABLET BY MOUTH TWICE DAILY . APPOINTMENT REQUIRED FOR FUTURE REFILLS 90 tablet 2   warfarin (COUMADIN) 5 MG tablet TAKE 1 TABLET BY MOUTH ONCE DAILY OR AS DIRECTED BY COUMADIN CLINIC 100 tablet 0   No current facility-administered medications for this visit.     Physical Exam: Blood pressure (!) 164/85, pulse (!) 54, resp. rate 18, weight 194 lb 6.4 oz (88.2 kg), SpO2 96%.        Exam    General- alert and comfortable    Neck- no JVD, no cervical adenopathy palpable, no carotid bruit   Lungs- clear without rales, wheezes   Cor- regular rate and rhythm, no murmur , gallop   Abdomen- soft, non-tender   Extremities - warm, non-tender, minimal edema   Neuro- oriented, appropriate, no focal weakness   Diagnostic Tests: Cardiac CT results reviewed with patient. There is no evidence the substernal density is related to the [facility] repair.  Does not appear to be a tumor based on previous PET scan.  Patient has mild to moderate coronary disease with vessel wall thickening but no significant reduction in flow by FFR.  Best therapy is continued Lipitor and heart healthy lifestyle.  Impression:- Plan The patient will be set up for a surveillance regular CTA in 1  year in our office.  He will be followed by his cardiologist Dr. [place].  He will start on low-dose losartan for improved blood pressure control going forward.      Lovett Sox, MD [facility] [phone]

## 2023-05-22 ENCOUNTER — Ambulatory Visit: Payer: PPO | Attending: Cardiology

## 2023-05-22 DIAGNOSIS — I359 Nonrheumatic aortic valve disorder, unspecified: Secondary | ICD-10-CM

## 2023-05-22 DIAGNOSIS — I480 Paroxysmal atrial fibrillation: Secondary | ICD-10-CM | POA: Diagnosis not present

## 2023-05-22 DIAGNOSIS — Z5181 Encounter for therapeutic drug level monitoring: Secondary | ICD-10-CM

## 2023-05-22 DIAGNOSIS — Z952 Presence of prosthetic heart valve: Secondary | ICD-10-CM

## 2023-05-22 LAB — POCT INR: INR: 1.8 — AB (ref 2.0–3.0)

## 2023-05-22 NOTE — Patient Instructions (Addendum)
Description   Take 2 tablets today and then continue taking 1 tablet tablet daily except for 1/2 tablet on Tuesdays.  Recheck INR in 1 week.  Call with any problems or new medications [phone]

## 2023-05-26 NOTE — Progress Notes (Signed)
 Cardiology Office Note:    Date:  05/30/2023   ID:  [name] [name], DOB November 04, 1955, MRN [id number]  PCP:  [email or web address], MD (Inactive)  Cardiologist:  Drexel Ivey, MD   Referring MD: Kodee Drury M, MD   Chief Complaint  Patient presents with   avr    History of Present Illness:    [name] [name] is a 68 y.o. male is seen for follow up s/p AVR. 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 No obstructive disease by heart cath 2015. He requires SBE prophylaxis. He was last seen by Josefa Beauvais NP in August 2021 with atypical chest pain.  He was seen recently by Dr Fleeta Ochoa and had surveillance CT showed a retrosternal density. After review this was felt to be an organized hematoma vs fat. No psuedoaneurysm. Calcium  score was 750. There was moderate diffuse  nonobstructive CAD. FFR normal.   He states he is feeling well. Denies any chest pain or dyspnea. No edema. He is doing some walking. No longer smoking weed. Avoids fried foods.     Past Medical History:  Diagnosis Date   Anxiety    Ascending aortic aneurysm (HCC) 01/07/2014   Asthma    Chest pain, atypical    Chronic anticoagulation    Depression    Hyperlipidemia    Hypertension    S/P AVR    for endocarditis    Past Surgical History:  Procedure Laterality Date   AORTIC VALVE REPLACEMENT  1988   23mm St. Jude valve   AORTIC VALVE REPLACEMENT N/A 01/23/2014   Procedure: REDO AORTIC VALVE REPLACEMENT (AVR) with 23 St. Jude Mechanical Aortic Valved Graft;  Surgeon: Maude Fleeta Ochoa, MD;  Location: [facility] OR;  Service: Open Heart Surgery;  Laterality: N/A;   APPLICATION OF WOUND VAC N/A 02/22/2014   Procedure: APPLICATION OF WOUND VAC;  Surgeon: Maude Fleeta Ochoa, MD;  Location: [facility] OR;  Service: Vascular;  Laterality: N/A;   BENTALL PROCEDURE N/A 01/23/2014   Procedure: BENTALL PROCEDURE;   Surgeon: Maude Fleeta Ochoa, MD;  Location: [facility] OR;  Service: Open Heart Surgery;  Laterality: N/A;   CARDIAC CATHETERIZATION     DOPPLER ECHOCARDIOGRAPHY  06/12/1996   EF 55-60%   HAND SURGERY     I & D EXTREMITY N/A 02/22/2014   Procedure: IRRIGATION AND DEBRIDEMENT EXTREMITY-CHEST;  Surgeon: Maude Fleeta Ochoa, MD;  Location: [facility] OR;  Service: Vascular;  Laterality: N/A;  PULSE LAVAGE   INTRAOPERATIVE TRANSESOPHAGEAL ECHOCARDIOGRAM N/A 01/23/2014   Procedure: INTRAOPERATIVE TRANSESOPHAGEAL ECHOCARDIOGRAM;  Surgeon: Maude Fleeta Ochoa, MD;  Location: [facility] OR;  Service: Open Heart Surgery;  Laterality: N/A;   LEFT HEART CATHETERIZATION WITH CORONARY ANGIOGRAM N/A 01/07/2014   Procedure: LEFT HEART CATHETERIZATION WITH CORONARY ANGIOGRAM;  Surgeon: Debby DELENA Sor, MD;  Location: [facility] CATH LAB;  Service: Cardiovascular;  Laterality: N/A;    Current Medications: Current Meds  Medication Sig   ALPRAZolam  (XANAX ) 0.5 MG tablet Take 0.5 mg by mouth daily as needed for anxiety.    atorvastatin  (LIPITOR) 80 MG tablet Take 1 tablet (80 mg total) by mouth daily. Patient is overdue for appointment. Please call [phone] to schedule an appointment.   dicyclomine (BENTYL) 10 MG capsule Take by mouth.   diphenhydramine -acetaminophen  (TYLENOL  PM) 25-500 MG TABS Take 1 tablet by mouth at bedtime as needed (sleep).   Fluticasone Furoate 200 MCG/ACT AEPB Inhale  1 puff into the lungs as needed.   losartan  (COZAAR ) 25 MG tablet Take 1 tablet (25 mg total) by mouth daily.   metoprolol  tartrate (LOPRESSOR ) 25 MG tablet TAKE 1/2 (ONE-HALF) TABLET BY MOUTH TWICE DAILY . APPOINTMENT REQUIRED FOR FUTURE REFILLS   warfarin (COUMADIN ) 5 MG tablet TAKE 1 TABLET BY MOUTH ONCE DAILY OR AS DIRECTED BY COUMADIN  CLINIC     Allergies:   Patient has no known allergies.   Social History   Socioeconomic History   Marital status: Single    Spouse name: Not on file   Number of children: Not on file   Years of education: Not on file    Highest education level: Not on file  Occupational History   Occupation: chartered certified accountant    Employer: [employer]  Tobacco Use   Smoking status: Former    Current packs/day: 0.00    Types: Cigarettes    Quit date: 05/04/1986    Years since quitting: 37.0   Smokeless tobacco: Never  Substance and Sexual Activity   Alcohol use: Yes    Alcohol/week: 3.0 standard drinks of alcohol    Types: 3 Shots of liquor per week    Comment: daily   Drug use: Yes    Types: Marijuana   Sexual activity: Not Currently    Partners: Female    Comment: SINGLE  Other Topics Concern   Not on file  Social History Narrative   Not on file   Social Drivers of Health   Financial Resource Strain: Low Risk  (01/19/2022)   Overall Financial Resource Strain (CARDIA)    Difficulty of Paying Living Expenses: Not very hard  Food Insecurity: No Food Insecurity (01/19/2022)   Hunger Vital Sign    Worried About Running Out of Food in the Last Year: Never true    Ran Out of Food in the Last Year: Never true  Transportation Needs: No Transportation Needs (01/19/2022)   PRAPARE - Administrator, Civil Service (Medical): No    Lack of Transportation (Non-Medical): No  Physical Activity: Not on file  Stress: Not on file  Social Connections: Not on file     Family History: The patient's family history includes Cancer in his mother; Healthy in his brother and brother.  ROS:   Please see the history of present illness.     All other systems reviewed and are negative.  EKGs/Labs/Other Studies Reviewed:    The following studies were reviewed today:  Echo 05/03/23:IMPRESSIONS     1. Left ventricular ejection fraction, by estimation, is 65 to 70%. The  left ventricle has normal function. The left ventricle has no regional  wall motion abnormalities. There is moderate asymmetric left ventricular  hypertrophy of the basal-septal  segment. Left ventricular diastolic parameters are indeterminate.   2. Right  ventricular systolic function is normal. The right ventricular  size is normal. Tricuspid regurgitation signal is inadequate for assessing  PA pressure.   3. Right atrial size was mildly dilated.   4. The mitral valve is normal in structure. Trivial mitral valve  regurgitation.   5. The aortic valve has been repaired/replaced. Aortic valve  regurgitation is trivial. There is a 23 mm St. Jude mechanical valve  present in the aortic position. Vmax 2.9 m/s, MG , EOA 1.4 cm^2, DI  0.44   EKG Interpretation Date/Time:  Tuesday May 30 2023 14:22:50 EST Ventricular Rate:  57 PR Interval:  184 QRS Duration:  98 QT Interval:  430 QTC Calculation:  418 R Axis:   -37  Text Interpretation: Sinus bradycardia Left axis deviation Pulmonary disease pattern Minimal voltage criteria for LVH, may be normal variant ( R in aVL ) Septal infarct , age undetermined When compared with ECG of 21-Feb-2014 17:17, Vent. rate has decreased BY  32 BPM QRS axis Shifted left Confirmed by Kalum Minner [phone]) on 05/30/2023 2:35:24 PM   Recent Labs: No results found for requested labs within last 365 days.  Recent Lipid Panel    Component Value Date/Time   CHOL 151 02/16/2017 1146   TRIG 79 02/16/2017 1146   HDL 52 02/16/2017 1146   CHOLHDL 2.5 09/11/2015 1301   VLDL 11 09/11/2015 1301   LDLCALC 83 02/16/2017 1146   LDLDIRECT 181.4 11/16/2012 1419   Dated 07/29/20: cholesterol 252, triglycerides 244, HDL 39, LDL 167. Creatinine 1.02. CMET normal. PSA normal. Dated 08/08/22: cholesterol 151, triglycerides 123, HDL 48, LDL 81. CBC and CMET normal  Physical Exam:    VS:  BP 122/70   Pulse (!) 57   Ht 5' 9 (1.753 m)   Wt 189 lb 12.8 oz (86.1 kg)   SpO2 93%   BMI 28.03 kg/m     Wt Readings from Last 3 Encounters:  05/30/23 189 lb 12.8 oz (86.1 kg)  05/18/23 194 lb 6.4 oz (88.2 kg)  05/04/23 197 lb 6.4 oz (89.5 kg)     GEN:  Well nourished, well developed in no acute distress HEENT:  Normal NECK: No JVD; No carotid bruits CARDIAC: RRR, crisp valve click, gr 2/6 harsh SEM, no rubs, gallops,sternotomy scar RESPIRATORY:  Clear to auscultation without rales, wheezing or rhonchi  ABDOMEN: Soft, non-tender, non-distended MUSCULOSKELETAL:  No edema; No deformity  SKIN: Warm and dry NEUROLOGIC:  Alert and oriented x 3 PSYCHIATRIC:  Normal affect   ASSESSMENT:    1. Coronary artery disease involving native coronary artery of native heart without angina pectoris   2. S/P AVR (aortic valve replacement)   3. Mixed hyperlipidemia     PLAN:      1. S/p redo mechanical AVR for prior endocarditis. (St. Jude 23 mm aortic valve and 25 mm Hemashield conduit, serial #[id number]).Normal valve function on exam and Echo. Continue Coumadin . SBE prophylaxis.   2. CAD with high calcium  score of 750 but nonobstructive CAD on CTA. No angina. Recommend risk factor modification.   3. PAF no recurrence. In NSR today  4. Hyperlipidemia. On high dose atorvastatin . Last LDL 81. Goal < 70. Will update labs today.   5. HTN. Well controlled on losartan  and metoprolol  - continue same.  Follow up in one year     Medication Adjustments/Labs and Tests Ordered: Current medicines are reviewed at length with the patient today.  Concerns regarding medicines are outlined above.  Orders Placed This Encounter  Procedures   EKG 12-Lead   No orders of the defined types were placed in this encounter.   Signed, Airika Alkhatib, MD  05/30/2023 2:52 PM    [facility]

## 2023-05-30 ENCOUNTER — Encounter: Payer: Self-pay | Admitting: Cardiology

## 2023-05-30 ENCOUNTER — Ambulatory Visit (INDEPENDENT_AMBULATORY_CARE_PROVIDER_SITE_OTHER): Payer: PPO | Admitting: *Deleted

## 2023-05-30 ENCOUNTER — Ambulatory Visit: Payer: PPO | Attending: Cardiology | Admitting: Cardiology

## 2023-05-30 VITALS — BP 122/70 | HR 57 | Ht 69.0 in | Wt 189.8 lb

## 2023-05-30 DIAGNOSIS — E782 Mixed hyperlipidemia: Secondary | ICD-10-CM

## 2023-05-30 DIAGNOSIS — Z952 Presence of prosthetic heart valve: Secondary | ICD-10-CM

## 2023-05-30 DIAGNOSIS — I480 Paroxysmal atrial fibrillation: Secondary | ICD-10-CM

## 2023-05-30 DIAGNOSIS — I359 Nonrheumatic aortic valve disorder, unspecified: Secondary | ICD-10-CM

## 2023-05-30 DIAGNOSIS — Z5181 Encounter for therapeutic drug level monitoring: Secondary | ICD-10-CM | POA: Diagnosis not present

## 2023-05-30 DIAGNOSIS — I2510 Atherosclerotic heart disease of native coronary artery without angina pectoris: Secondary | ICD-10-CM

## 2023-05-30 DIAGNOSIS — Z954 Presence of other heart-valve replacement: Secondary | ICD-10-CM

## 2023-05-30 LAB — POCT INR: INR: 2.7 (ref 2.0–3.0)

## 2023-05-30 NOTE — Patient Instructions (Signed)
 Medication Instructions:  Continue same medications *If you need a refill on your cardiac medications before your next appointment, please call your pharmacy*   Lab Work: Cmet,cbc,lipid panel today   Testing/Procedures: None ordered   Follow-Up: At [facility], you and your health needs are our priority.  As part of our continuing mission to provide you with exceptional heart care, we have created designated Provider Care Teams.  These Care Teams include your primary Cardiologist (physician) and Advanced Practice Providers (APPs -  Physician Assistants and Nurse Practitioners) who all work together to provide you with the care you need, when you need it.  We recommend signing up for the patient portal called MyChart.  Sign up information is provided on this After Visit Summary.  MyChart is used to connect with patients for Virtual Visits (Telemedicine).  Patients are able to view lab/test results, encounter notes, upcoming appointments, etc.  Non-urgent messages can be sent to your provider as well.   To learn more about what you can do with MyChart, go to [email or web address].    Your next appointment:  1 year   Call in Oct to schedule Jan appointment     Provider:  Dr.Jordan

## 2023-05-30 NOTE — Patient Instructions (Signed)
Description   Take 1 tablet of warfarin today and then continue taking 1 tablet tablet daily except for 1/2 tablet on Tuesdays.  Recheck INR in 4 weeks. Call with any problems or new medications [phone]

## 2023-05-31 ENCOUNTER — Other Ambulatory Visit: Payer: Self-pay

## 2023-05-31 DIAGNOSIS — E782 Mixed hyperlipidemia: Secondary | ICD-10-CM

## 2023-05-31 LAB — CBC WITH DIFFERENTIAL/PLATELET
Basophils Absolute: 0.1 10*3/uL (ref 0.0–0.2)
Basos: 1 %
EOS (ABSOLUTE): 0.2 10*3/uL (ref 0.0–0.4)
Eos: 3 %
Hematocrit: 44.2 % (ref 37.5–51.0)
Hemoglobin: 14.7 g/dL (ref 13.0–17.7)
Immature Grans (Abs): 0 10*3/uL (ref 0.0–0.1)
Immature Granulocytes: 0 %
Lymphocytes Absolute: 0.9 10*3/uL (ref 0.7–3.1)
Lymphs: 13 %
MCH: 27.9 pg (ref 26.6–33.0)
MCHC: 33.3 g/dL (ref 31.5–35.7)
MCV: 84 fL (ref 79–97)
Monocytes Absolute: 0.6 10*3/uL (ref 0.1–0.9)
Monocytes: 9 %
Neutrophils Absolute: 5 10*3/uL (ref 1.4–7.0)
Neutrophils: 74 %
Platelets: 159 10*3/uL (ref 150–450)
RBC: 5.27 x10E6/uL (ref 4.14–5.80)
RDW: 14.3 % (ref 11.6–15.4)
WBC: 6.7 10*3/uL (ref 3.4–10.8)

## 2023-05-31 LAB — COMPREHENSIVE METABOLIC PANEL
ALT: 24 [IU]/L (ref 0–44)
AST: 20 [IU]/L (ref 0–40)
Albumin: 4.5 g/dL (ref 3.9–4.9)
Alkaline Phosphatase: 89 [IU]/L (ref 44–121)
BUN/Creatinine Ratio: 18 (ref 10–24)
BUN: 19 mg/dL (ref 8–27)
Bilirubin Total: 0.5 mg/dL (ref 0.0–1.2)
CO2: 24 mmol/L (ref 20–29)
Calcium: 9.9 mg/dL (ref 8.6–10.2)
Chloride: 103 mmol/L (ref 96–106)
Creatinine, Ser: 1.03 mg/dL (ref 0.76–1.27)
Globulin, Total: 2.5 g/dL (ref 1.5–4.5)
Glucose: 99 mg/dL (ref 70–99)
Potassium: 4.3 mmol/L (ref 3.5–5.2)
Sodium: 140 mmol/L (ref 134–144)
Total Protein: 7 g/dL (ref 6.0–8.5)
eGFR: 80 mL/min/{1.73_m2} (ref >59)

## 2023-05-31 LAB — LIPID PANEL
Chol/HDL Ratio: 3.3 {ratio} (ref 0.0–5.0)
Cholesterol, Total: 133 mg/dL (ref 100–199)
HDL: 40 mg/dL (ref >39)
LDL Chol Calc (NIH): 76 mg/dL (ref 0–99)
Triglycerides: 88 mg/dL (ref 0–149)
VLDL Cholesterol Cal: 17 mg/dL (ref 5–40)

## 2023-05-31 MED ORDER — EZETIMIBE 10 MG PO TABS: 10.0000 mg | ORAL_TABLET | Freq: Every day | ORAL | 3 refills | Status: AC

## 2023-06-09 ENCOUNTER — Telehealth: Payer: Self-pay | Admitting: Cardiology

## 2023-06-09 NOTE — Telephone Encounter (Signed)
Spoke to patient recent lab results mailed.He wanted to ask Dr.Jordan if ok to eat grilled shrimp.

## 2023-06-09 NOTE — Telephone Encounter (Signed)
Pt states that he would like recent lab results mailed to him. Please advise

## 2023-06-09 NOTE — Telephone Encounter (Signed)
Called patient left message on personal voice mail Dr.Jordan advised ok to eat grilled shrimp.

## 2023-06-27 ENCOUNTER — Ambulatory Visit: Payer: PPO

## 2023-06-28 ENCOUNTER — Ambulatory Visit: Attending: Cardiology

## 2023-06-28 DIAGNOSIS — I359 Nonrheumatic aortic valve disorder, unspecified: Secondary | ICD-10-CM

## 2023-06-28 DIAGNOSIS — Z952 Presence of prosthetic heart valve: Secondary | ICD-10-CM

## 2023-06-28 DIAGNOSIS — I480 Paroxysmal atrial fibrillation: Secondary | ICD-10-CM

## 2023-06-28 DIAGNOSIS — Z5181 Encounter for therapeutic drug level monitoring: Secondary | ICD-10-CM

## 2023-06-28 LAB — POCT INR: INR: 1.5 — AB (ref 2.0–3.0)

## 2023-06-28 NOTE — Patient Instructions (Signed)
 Description   Take 1.5 tablets of warfarin today and 1.5 tablets tomorrow and then START taking 1 tablet tablet daily.  Recheck INR in 2 weeks. Call with any problems or new medications [phone]

## 2023-07-05 ENCOUNTER — Other Ambulatory Visit: Payer: Self-pay | Admitting: Cardiology

## 2023-07-08 ENCOUNTER — Other Ambulatory Visit: Payer: Self-pay | Admitting: Cardiology

## 2023-07-08 DIAGNOSIS — I480 Paroxysmal atrial fibrillation: Secondary | ICD-10-CM

## 2023-07-11 ENCOUNTER — Ambulatory Visit: Attending: Internal Medicine

## 2023-07-11 ENCOUNTER — Other Ambulatory Visit: Payer: Self-pay | Admitting: Cardiology

## 2023-07-11 DIAGNOSIS — Z952 Presence of prosthetic heart valve: Secondary | ICD-10-CM | POA: Diagnosis not present

## 2023-07-11 DIAGNOSIS — Z5181 Encounter for therapeutic drug level monitoring: Secondary | ICD-10-CM

## 2023-07-11 DIAGNOSIS — I359 Nonrheumatic aortic valve disorder, unspecified: Secondary | ICD-10-CM | POA: Diagnosis not present

## 2023-07-11 DIAGNOSIS — I480 Paroxysmal atrial fibrillation: Secondary | ICD-10-CM

## 2023-07-11 DIAGNOSIS — Z954 Presence of other heart-valve replacement: Secondary | ICD-10-CM | POA: Diagnosis not present

## 2023-07-11 LAB — POCT INR: INR: 4.1 — AB (ref 2.0–3.0)

## 2023-07-11 NOTE — Patient Instructions (Signed)
 Hold today only then continue taking 1 tablet  daily.  Recheck INR in 3 weeks. -  Call with any problems or new medications [phone]

## 2023-08-02 ENCOUNTER — Ambulatory Visit: Attending: Cardiology | Admitting: *Deleted

## 2023-08-02 DIAGNOSIS — I359 Nonrheumatic aortic valve disorder, unspecified: Secondary | ICD-10-CM | POA: Diagnosis not present

## 2023-08-02 DIAGNOSIS — Z952 Presence of prosthetic heart valve: Secondary | ICD-10-CM

## 2023-08-02 DIAGNOSIS — I480 Paroxysmal atrial fibrillation: Secondary | ICD-10-CM

## 2023-08-02 DIAGNOSIS — Z5181 Encounter for therapeutic drug level monitoring: Secondary | ICD-10-CM

## 2023-08-02 DIAGNOSIS — Z954 Presence of other heart-valve replacement: Secondary | ICD-10-CM

## 2023-08-02 LAB — POCT INR: INR: 4.1 — AB (ref 2.0–3.0)

## 2023-08-02 NOTE — Patient Instructions (Signed)
 Description   Do not take any warfarin today then START taking 1 tablet daily except 1/2 tablet on Sundays.  Recheck INR in 4 weeks per request.  Call with any problems or new medications [phone]

## 2023-08-30 ENCOUNTER — Encounter

## 2023-08-30 LAB — HEPATIC FUNCTION PANEL
ALT: 31 IU/L (ref 0–44)
AST: 19 IU/L (ref 0–40)
Albumin: 4.1 g/dL (ref 3.9–4.9)
Alkaline Phosphatase: 94 IU/L (ref 44–121)
Bilirubin Total: 0.6 mg/dL (ref 0.0–1.2)
Bilirubin, Direct: 0.19 mg/dL (ref 0.00–0.40)
Total Protein: 6.3 g/dL (ref 6.0–8.5)

## 2023-08-30 LAB — LIPID PANEL
Chol/HDL Ratio: 3.2 ratio (ref 0.0–5.0)
Cholesterol, Total: 131 mg/dL (ref 100–199)
HDL: 41 mg/dL (ref >39)
LDL Chol Calc (NIH): 76 mg/dL (ref 0–99)
Triglycerides: 68 mg/dL (ref 0–149)
VLDL Cholesterol Cal: 14 mg/dL (ref 5–40)

## 2023-09-01 ENCOUNTER — Ambulatory Visit: Attending: Cardiology | Admitting: *Deleted

## 2023-09-01 DIAGNOSIS — I480 Paroxysmal atrial fibrillation: Secondary | ICD-10-CM

## 2023-09-01 DIAGNOSIS — Z952 Presence of prosthetic heart valve: Secondary | ICD-10-CM | POA: Diagnosis not present

## 2023-09-01 DIAGNOSIS — Z5181 Encounter for therapeutic drug level monitoring: Secondary | ICD-10-CM

## 2023-09-01 DIAGNOSIS — I359 Nonrheumatic aortic valve disorder, unspecified: Secondary | ICD-10-CM | POA: Diagnosis not present

## 2023-09-01 DIAGNOSIS — Z954 Presence of other heart-valve replacement: Secondary | ICD-10-CM | POA: Diagnosis not present

## 2023-09-01 LAB — POCT INR: INR: 2.6 (ref 2.0–3.0)

## 2023-09-01 NOTE — Patient Instructions (Addendum)
 Description   Continue taking warfarin 1 tablet daily except 1/2 tablet on Tuesdays.  Recheck INR in 4 weeks per request.  Call with any problems or new medications [phone]

## 2023-09-29 ENCOUNTER — Ambulatory Visit: Attending: Cardiology | Admitting: *Deleted

## 2023-09-29 DIAGNOSIS — Z952 Presence of prosthetic heart valve: Secondary | ICD-10-CM | POA: Diagnosis not present

## 2023-09-29 DIAGNOSIS — Z5181 Encounter for therapeutic drug level monitoring: Secondary | ICD-10-CM | POA: Diagnosis not present

## 2023-09-29 DIAGNOSIS — I480 Paroxysmal atrial fibrillation: Secondary | ICD-10-CM | POA: Diagnosis not present

## 2023-09-29 DIAGNOSIS — I359 Nonrheumatic aortic valve disorder, unspecified: Secondary | ICD-10-CM

## 2023-09-29 DIAGNOSIS — Z954 Presence of other heart-valve replacement: Secondary | ICD-10-CM

## 2023-09-29 LAB — POCT INR: INR: 4 — AB (ref 2.0–3.0)

## 2023-09-29 NOTE — Patient Instructions (Signed)
 Description   Do not take any warfarin today then continue taking warfarin 1 tablet daily except 1/2 tablet on Tuesdays. Have a salad today and keep in your diet weekly.  Recheck INR in 4 weeks per request.  Call with any problems or new medications [phone]

## 2023-10-26 ENCOUNTER — Ambulatory Visit: Attending: Cardiology | Admitting: *Deleted

## 2023-10-26 DIAGNOSIS — I480 Paroxysmal atrial fibrillation: Secondary | ICD-10-CM | POA: Diagnosis not present

## 2023-10-26 DIAGNOSIS — Z5181 Encounter for therapeutic drug level monitoring: Secondary | ICD-10-CM | POA: Diagnosis not present

## 2023-10-26 DIAGNOSIS — I359 Nonrheumatic aortic valve disorder, unspecified: Secondary | ICD-10-CM

## 2023-10-26 DIAGNOSIS — Z952 Presence of prosthetic heart valve: Secondary | ICD-10-CM

## 2023-10-26 LAB — POCT INR: POC INR: 5.1

## 2023-10-26 NOTE — Patient Instructions (Signed)
 Description   Hold warfarin today and tomorrow Then START taking warfarin 1 tablet daily except for 1/2 a tablet on Tuesday and Thursdays. Recheck INR in 3 weeks per request.  Call with any problems or new medications [phone]

## 2023-10-26 NOTE — Progress Notes (Signed)
Please see anticoagulation encounter.

## 2023-11-02 ENCOUNTER — Other Ambulatory Visit: Payer: Self-pay | Admitting: Cardiology

## 2023-11-02 DIAGNOSIS — I480 Paroxysmal atrial fibrillation: Secondary | ICD-10-CM

## 2023-11-02 NOTE — Telephone Encounter (Signed)
 Prescription refill request received for warfarin Lov: 05/30/23 ([place])  Next INR check: 11/17/23 Warfarin tablet strength: 5mg   Appropriate dose.Refill sent.

## 2023-11-17 ENCOUNTER — Telehealth: Payer: Self-pay | Admitting: Cardiology

## 2023-11-17 ENCOUNTER — Ambulatory Visit: Attending: Cardiology

## 2023-11-17 DIAGNOSIS — I480 Paroxysmal atrial fibrillation: Secondary | ICD-10-CM | POA: Diagnosis not present

## 2023-11-17 DIAGNOSIS — Z952 Presence of prosthetic heart valve: Secondary | ICD-10-CM

## 2023-11-17 DIAGNOSIS — Z5181 Encounter for therapeutic drug level monitoring: Secondary | ICD-10-CM

## 2023-11-17 DIAGNOSIS — I359 Nonrheumatic aortic valve disorder, unspecified: Secondary | ICD-10-CM

## 2023-11-17 LAB — POCT INR: INR: 2 (ref 2.0–3.0)

## 2023-11-17 NOTE — Progress Notes (Signed)
 INR 2.0 Please see anticoagulation encounter.

## 2023-11-17 NOTE — Patient Instructions (Signed)
 Description   Take 1.5 tablets today and 1.5 tablets tomorrow and then resume taking warfarin 1 tablet daily except for 1/2 a tablet on Tuesday and Thursdays.  Recheck INR in 3 weeks per request.  Call with any problems or new medications [phone]

## 2023-11-17 NOTE — Telephone Encounter (Signed)
*  STAT* If patient is at the pharmacy, call can be transferred to refill team.   1. Which medications need to be refilled? (please list name of each medication and dose if known)  losartan  (COZAAR ) 25 MG tablet    2. Which pharmacy/location (including street and city if local pharmacy) is medication to be sent to? [id number] - [place] ([place]), [place] - [address]    3. Do they need a 30 day or 90 day supply?  90 day supply

## 2023-11-20 ENCOUNTER — Encounter

## 2023-11-22 MED ORDER — LOSARTAN POTASSIUM 25 MG PO TABS: 25.0000 mg | ORAL_TABLET | Freq: Every day | ORAL | 3 refills | Status: AC

## 2023-11-22 NOTE — Telephone Encounter (Signed)
 Pt called back in for refill, he states he is completely out

## 2023-11-22 NOTE — Telephone Encounter (Signed)
 Last filled by [facility]  [facility] to fill?? Please review

## 2023-11-22 NOTE — Telephone Encounter (Signed)
Called patient left message on personal voice mail Losartan refill sent to your pharmacy.

## 2023-12-08 ENCOUNTER — Ambulatory Visit: Attending: Cardiology

## 2023-12-08 DIAGNOSIS — I359 Nonrheumatic aortic valve disorder, unspecified: Secondary | ICD-10-CM

## 2023-12-08 DIAGNOSIS — Z952 Presence of prosthetic heart valve: Secondary | ICD-10-CM | POA: Diagnosis not present

## 2023-12-08 DIAGNOSIS — Z954 Presence of other heart-valve replacement: Secondary | ICD-10-CM

## 2023-12-08 DIAGNOSIS — Z5181 Encounter for therapeutic drug level monitoring: Secondary | ICD-10-CM

## 2023-12-08 DIAGNOSIS — I480 Paroxysmal atrial fibrillation: Secondary | ICD-10-CM

## 2023-12-08 LAB — POCT INR: INR: 1.8 — AB (ref 2.0–3.0)

## 2023-12-08 NOTE — Progress Notes (Signed)
 INR 1.8. Please see anticoagulation encounter

## 2023-12-08 NOTE — Patient Instructions (Signed)
 Take 2 tablets today then Increase to 1 tablet daily except for 1/2 a tablet on Tuesday.  Recheck INR in 3 weeks per request.  Call with any problems or new medications [phone]

## 2023-12-29 ENCOUNTER — Ambulatory Visit

## 2024-01-05 ENCOUNTER — Ambulatory Visit: Attending: Cardiology | Admitting: *Deleted

## 2024-01-05 DIAGNOSIS — I359 Nonrheumatic aortic valve disorder, unspecified: Secondary | ICD-10-CM

## 2024-01-05 DIAGNOSIS — I480 Paroxysmal atrial fibrillation: Secondary | ICD-10-CM | POA: Diagnosis not present

## 2024-01-05 DIAGNOSIS — Z952 Presence of prosthetic heart valve: Secondary | ICD-10-CM

## 2024-01-05 DIAGNOSIS — Z5181 Encounter for therapeutic drug level monitoring: Secondary | ICD-10-CM

## 2024-01-05 DIAGNOSIS — Z954 Presence of other heart-valve replacement: Secondary | ICD-10-CM

## 2024-01-05 LAB — POCT INR: INR: 2.8 (ref 2.0–3.0)

## 2024-01-05 NOTE — Patient Instructions (Addendum)
 Description   INR-2.8; Continue taking warfarin 1 tablet daily except for 1/2 tablet on Tuesday. Recheck INR in 4 weeks per request.  Call with any problems or new medications [phone]

## 2024-01-05 NOTE — Progress Notes (Signed)
 Description   INR-2.8; Continue taking warfarin 1 tablet daily except for 1/2 tablet on Tuesday. Recheck INR in 4 weeks per request.  Call with any problems or new medications [phone]

## 2024-02-02 ENCOUNTER — Ambulatory Visit

## 2024-02-09 ENCOUNTER — Ambulatory Visit: Attending: Cardiology

## 2024-02-09 DIAGNOSIS — I359 Nonrheumatic aortic valve disorder, unspecified: Secondary | ICD-10-CM | POA: Diagnosis not present

## 2024-02-09 DIAGNOSIS — I480 Paroxysmal atrial fibrillation: Secondary | ICD-10-CM

## 2024-02-09 DIAGNOSIS — Z954 Presence of other heart-valve replacement: Secondary | ICD-10-CM | POA: Diagnosis not present

## 2024-02-09 DIAGNOSIS — Z952 Presence of prosthetic heart valve: Secondary | ICD-10-CM | POA: Diagnosis not present

## 2024-02-09 DIAGNOSIS — Z5181 Encounter for therapeutic drug level monitoring: Secondary | ICD-10-CM

## 2024-02-09 LAB — POCT INR: INR: 5.5 — AB (ref 2.0–3.0)

## 2024-02-09 NOTE — Patient Instructions (Signed)
 HOLD Today, Saturday and Sunday and then Continue taking warfarin 1 tablet daily except for 1/2 tablet on Tuesday. Recheck INR in 3 weeks per request.  Call with any problems or new medications [phone]

## 2024-02-09 NOTE — Progress Notes (Signed)
 INR 5.5 Please see anticoagulation encounter HOLD Today, Saturday and Sunday and then Continue taking warfarin 1 tablet daily except for 1/2 tablet on Tuesday. Recheck INR in 3 weeks per request.  Call with any problems or new medications [phone]

## 2024-02-24 ENCOUNTER — Other Ambulatory Visit: Payer: Self-pay | Admitting: Cardiology

## 2024-02-24 DIAGNOSIS — I480 Paroxysmal atrial fibrillation: Secondary | ICD-10-CM

## 2024-02-29 ENCOUNTER — Ambulatory Visit: Attending: Cardiology

## 2024-02-29 DIAGNOSIS — Z954 Presence of other heart-valve replacement: Secondary | ICD-10-CM

## 2024-02-29 DIAGNOSIS — I359 Nonrheumatic aortic valve disorder, unspecified: Secondary | ICD-10-CM

## 2024-02-29 DIAGNOSIS — Z5181 Encounter for therapeutic drug level monitoring: Secondary | ICD-10-CM | POA: Diagnosis not present

## 2024-02-29 DIAGNOSIS — I480 Paroxysmal atrial fibrillation: Secondary | ICD-10-CM | POA: Diagnosis not present

## 2024-02-29 DIAGNOSIS — Z952 Presence of prosthetic heart valve: Secondary | ICD-10-CM | POA: Diagnosis not present

## 2024-02-29 LAB — POCT INR: INR: 2.8 (ref 2.0–3.0)

## 2024-02-29 NOTE — Patient Instructions (Signed)
 Continue taking warfarin 1 tablet daily except for 1/2 tablet on Tuesday. Recheck INR in 4 weeks per request.  Call with any problems or new medications [phone]

## 2024-02-29 NOTE — Progress Notes (Signed)
 INR 2.8 Please see anticoagulation encounter Continue taking warfarin 1 tablet daily except for 1/2 tablet on Tuesday. Recheck INR in 4 weeks per request.  Call with any problems or new medications [phone]

## 2024-03-12 LAB — LAB REPORT - SCANNED: EGFR: 77

## 2024-03-29 ENCOUNTER — Ambulatory Visit: Attending: Cardiology | Admitting: *Deleted

## 2024-03-29 DIAGNOSIS — Z952 Presence of prosthetic heart valve: Secondary | ICD-10-CM

## 2024-03-29 DIAGNOSIS — I480 Paroxysmal atrial fibrillation: Secondary | ICD-10-CM

## 2024-03-29 DIAGNOSIS — Z5181 Encounter for therapeutic drug level monitoring: Secondary | ICD-10-CM | POA: Diagnosis not present

## 2024-03-29 DIAGNOSIS — I359 Nonrheumatic aortic valve disorder, unspecified: Secondary | ICD-10-CM | POA: Diagnosis not present

## 2024-03-29 DIAGNOSIS — Z954 Presence of other heart-valve replacement: Secondary | ICD-10-CM | POA: Diagnosis not present

## 2024-03-29 LAB — POCT INR: INR: 2 (ref 2.0–3.0)

## 2024-03-29 NOTE — Progress Notes (Signed)
 Description   INR-2.0; Today take 1.5 tablets then continue taking warfarin 1 tablet daily except for 1/2 tablet on Tuesday. Recheck INR in 4 weeks per request.  Call with any problems or new medications [phone]

## 2024-03-29 NOTE — Patient Instructions (Signed)
 Description   INR-2.0; Today take 1.5 tablets then continue taking warfarin 1 tablet daily except for 1/2 tablet on Tuesday. Recheck INR in 4 weeks per request.  Call with any problems or new medications [phone]

## 2024-04-26 ENCOUNTER — Ambulatory Visit: Attending: Cardiology | Admitting: Pharmacist

## 2024-04-26 DIAGNOSIS — Z952 Presence of prosthetic heart valve: Secondary | ICD-10-CM

## 2024-04-26 DIAGNOSIS — I480 Paroxysmal atrial fibrillation: Secondary | ICD-10-CM

## 2024-04-26 DIAGNOSIS — I359 Nonrheumatic aortic valve disorder, unspecified: Secondary | ICD-10-CM | POA: Diagnosis not present

## 2024-04-26 DIAGNOSIS — Z954 Presence of other heart-valve replacement: Secondary | ICD-10-CM | POA: Diagnosis not present

## 2024-04-26 DIAGNOSIS — Z5181 Encounter for therapeutic drug level monitoring: Secondary | ICD-10-CM

## 2024-04-26 LAB — POCT INR: INR: 2.7 (ref 2.0–3.0)

## 2024-04-26 NOTE — Patient Instructions (Addendum)
 Description   INR-2.7: Continue taking warfarin 1 tablet daily except for 1/2 tablet on Tuesday. Recheck INR in 4 weeks per request.  Call with any problems or new medications [phone]

## 2024-04-26 NOTE — Progress Notes (Signed)
 Description   INR-2.7: Continue taking warfarin 1 tablet daily except for 1/2 tablet on Tuesday. Recheck INR in 4 weeks per request.  Call with any problems or new medications [phone]

## 2024-05-02 ENCOUNTER — Other Ambulatory Visit: Payer: Self-pay

## 2024-05-02 DIAGNOSIS — Z952 Presence of prosthetic heart valve: Secondary | ICD-10-CM

## 2024-05-02 DIAGNOSIS — I7121 Aneurysm of the ascending aorta, without rupture: Secondary | ICD-10-CM

## 2024-05-16 ENCOUNTER — Ambulatory Visit (HOSPITAL_COMMUNITY)
Admission: RE | Admit: 2024-05-16 | Discharge: 2024-05-16 | Disposition: A | Discharge status: Home / Self Care | Source: Ambulatory Visit | Attending: Cardiology | Admitting: Cardiology

## 2024-05-16 DIAGNOSIS — I7121 Aneurysm of the ascending aorta, without rupture: Secondary | ICD-10-CM | POA: Insufficient documentation

## 2024-05-16 DIAGNOSIS — Z952 Presence of prosthetic heart valve: Secondary | ICD-10-CM | POA: Diagnosis present

## 2024-05-16 MED ORDER — IOHEXOL 350 MG/ML SOLN
75.0000 mL | Freq: Once | INTRAVENOUS | Status: AC | PRN
  Administered 2024-05-16: 75 mL via INTRAVENOUS

## 2024-05-24 ENCOUNTER — Ambulatory Visit

## 2024-05-30 ENCOUNTER — Ambulatory Visit

## 2024-05-30 VITALS — BP 122/70 | HR 51 | Resp 20 | Ht 69.0 in | Wt 197.1 lb

## 2024-05-30 DIAGNOSIS — Z952 Presence of prosthetic heart valve: Secondary | ICD-10-CM

## 2024-05-30 DIAGNOSIS — R911 Solitary pulmonary nodule: Secondary | ICD-10-CM

## 2024-05-30 DIAGNOSIS — I7121 Aneurysm of the ascending aorta, without rupture: Secondary | ICD-10-CM

## 2024-05-30 NOTE — Progress Notes (Unsigned)
 "      [address] [place] [address]             [phone]            [name] [name] [id number] 11/19/1955   History of Present Illness:  [name] [name] is a 69 year old man with medical history of hypertension, paroxsymal atrial fibrillation,     Medications Ordered Prior to Encounter[1]   ROS:   BP 122/70 (BP Location: Left Arm, Patient Position: Sitting, Cuff Size: Normal)   Pulse (!) 51   Resp 20   Ht 5' 9 (1.753 m)   Wt 197 lb 1.6 oz (89.4 kg)   SpO2 97% Comment: RA  BMI 29.11 kg/m     Imaging: CLINICAL DATA:  Aortic aneurysm.   EXAM: CT ANGIOGRAPHY CHEST WITH CONTRAST   TECHNIQUE: Multidetector CT imaging of the chest was performed using the standard protocol during bolus administration of intravenous contrast. Multiplanar CT image reconstructions and MIPs were obtained to evaluate the vascular anatomy.   RADIATION DOSE REDUCTION: This exam was performed according to the departmental dose-optimization program which includes automated exposure control, adjustment of the mA and/or kV according to patient size and/or use of iterative reconstruction technique.   CONTRAST:  75mL OMNIPAQUE  IOHEXOL  350 MG/ML SOLN   COMPARISON:  Chest CT dated 05/17/2023.   FINDINGS: Cardiovascular: There is no cardiomegaly or pericardial effusion. Postsurgical changes of the aortic root and ascending aorta similar to CT of 05/17/2023. No interval change in the caliber of the ascending aorta. 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 There is moderate calcified and noncalcified plaque of the thoracic aorta. No aortic dissection. The origins of the great vessels of the aortic arch the central pulmonary arteries appear patent.   Mediastinum/Nodes: No hilar or mediastinal adenopathy. The esophagus is grossly unremarkable.   Lungs/Pleura:  There is a 1 cm nodule in the lingula similar to prior CT dating back to 10/05/2021. No consolidation. There is no pleural effusion or pneumothorax. The central airways are patent.   Upper Abdomen: No acute abnormality.   Musculoskeletal: Degenerative changes of the spine. Median sternotomy wires. No acute osseous pathology.   Review of the MIP images confirms the above findings.   IMPRESSION: 1. Stable postsurgical changes of the aortic root. No interval change in the caliber of the ascending aorta. Follow-up as clinically indicated. 2. 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 3. A 1 cm nodule in the lingula similar to prior CT dating back to 10/05/2021. Attention on follow-up imaging recommended. 4.  Aortic Atherosclerosis (ICD10-I70.0).     Electronically Signed   By: Vanetta Chou M.D.   On: 05/16/2024 14:43     A/P:      Risk Modification:  Statin:  ***  Smoking cessation instruction/counseling given:  {CHL AMB [facility] SMOKING CESSATION COUNSELING:20758}  Patient was counseled on importance of Blood Pressure Control  They are instructed to contact their Primary Care Physician if they start to have blood pressure readings over 130s/90s. Do not ever stop blood pressure medications on your own, unless instructed by healthcare professional.  Please avoid use of Fluoroquinolones as this can potentially increase your risk of Aortic Rupture and/or Dissection  Patient educated on signs and symptoms of Aortic Dissection, handout also provided in AVS  [name] CHRISTELLA  Rutha, PA-C 05/30/24     [1]  Current Outpatient Medications on File Prior to Visit  Medication Sig Dispense Refill   ALPRAZolam  (XANAX ) 0.5 MG tablet Take 0.5 mg by mouth daily as needed for anxiety.      atorvastatin  (LIPITOR) 80 MG tablet Take 1 tablet by mouth once daily 90 tablet 3   dicyclomine (BENTYL) 10 MG capsule Take by  mouth.     diphenhydramine -acetaminophen  (TYLENOL  PM) 25-500 MG TABS Take 1 tablet by mouth at bedtime as needed (sleep).     ezetimibe  (ZETIA ) 10 MG tablet Take 1 tablet (10 mg total) by mouth daily. 90 tablet 3   Fluticasone Furoate 200 MCG/ACT AEPB Inhale 1 puff into the lungs as needed.     losartan  (COZAAR ) 25 MG tablet Take 1 tablet (25 mg total) by mouth daily. 90 tablet 3   metoprolol  tartrate (LOPRESSOR ) 25 MG tablet Take 0.5 tablets (12.5 mg total) by mouth 2 (two) times daily. 90 tablet 3   warfarin (COUMADIN ) 5 MG tablet TAKE 1/2 TO 1 (ONE-HALF TO ONE) TABLET BY MOUTH ONCE DAILY 100 tablet 0   No current facility-administered medications on file prior to visit.   "

## 2024-05-31 NOTE — Patient Instructions (Signed)
-  Follow up in 18 months with CTA of chest

## 2024-06-06 ENCOUNTER — Ambulatory Visit
